# Patient Record
Sex: Male | Born: 1960 | Race: White | Hispanic: No | Marital: Single | Smoking: Current every day smoker
Health system: Southern US, Community
[De-identification: ages and names within clinical notes are randomized; demographics above are authoritative.]

## PROBLEM LIST (undated history)

## (undated) DIAGNOSIS — R519 Headache, unspecified: Secondary | ICD-10-CM

## (undated) DIAGNOSIS — R51 Headache: Secondary | ICD-10-CM

## (undated) DIAGNOSIS — I10 Essential (primary) hypertension: Secondary | ICD-10-CM

## (undated) DIAGNOSIS — E785 Hyperlipidemia, unspecified: Secondary | ICD-10-CM

## (undated) DIAGNOSIS — E890 Postprocedural hypothyroidism: Secondary | ICD-10-CM

## (undated) DIAGNOSIS — R609 Edema, unspecified: Secondary | ICD-10-CM

## (undated) DIAGNOSIS — I639 Cerebral infarction, unspecified: Secondary | ICD-10-CM

## (undated) DIAGNOSIS — G8929 Other chronic pain: Secondary | ICD-10-CM

## (undated) HISTORY — DX: Headache, unspecified: R51.9

## (undated) HISTORY — DX: Edema, unspecified: R60.9

## (undated) HISTORY — DX: Headache: R51

## (undated) HISTORY — DX: Other chronic pain: G89.29

## (undated) HISTORY — PX: COLONOSCOPY: SHX174

## (undated) HISTORY — DX: Essential (primary) hypertension: I10

## (undated) HISTORY — PX: POLYPECTOMY: SHX149

## (undated) HISTORY — PX: OTHER SURGICAL HISTORY: SHX169

## (undated) HISTORY — DX: Postprocedural hypothyroidism: E89.0

## (undated) HISTORY — PX: ANKLE SURGERY: SHX546

## (undated) HISTORY — DX: Cerebral infarction, unspecified: I63.9

## (undated) HISTORY — DX: Hyperlipidemia, unspecified: E78.5

---

## 2010-11-06 ENCOUNTER — Emergency Department (HOSPITAL_COMMUNITY)
Admission: EM | Admit: 2010-11-06 | Discharge: 2010-11-06 | Payer: Self-pay | Source: Home / Self Care | Admitting: Emergency Medicine

## 2011-03-23 ENCOUNTER — Other Ambulatory Visit: Payer: Self-pay | Admitting: Otolaryngology

## 2011-03-23 DIAGNOSIS — H918X9 Other specified hearing loss, unspecified ear: Secondary | ICD-10-CM

## 2011-03-23 DIAGNOSIS — H918X3 Other specified hearing loss, bilateral: Secondary | ICD-10-CM

## 2011-03-23 DIAGNOSIS — R42 Dizziness and giddiness: Secondary | ICD-10-CM

## 2011-03-26 ENCOUNTER — Ambulatory Visit
Admission: RE | Admit: 2011-03-26 | Discharge: 2011-03-26 | Disposition: A | Discharge status: Home / Self Care | Payer: 59 | Source: Ambulatory Visit | Attending: Otolaryngology | Admitting: Otolaryngology

## 2011-03-26 DIAGNOSIS — R42 Dizziness and giddiness: Secondary | ICD-10-CM

## 2011-03-26 DIAGNOSIS — H918X9 Other specified hearing loss, unspecified ear: Secondary | ICD-10-CM

## 2011-03-26 MED ORDER — GADOBENATE DIMEGLUMINE 529 MG/ML IV SOLN
19.0000 mL | Freq: Once | INTRAVENOUS | Status: AC | PRN
  Administered 2011-03-26: 19 mL via INTRAVENOUS

## 2011-04-30 LAB — BASIC METABOLIC PANEL
BUN: 11 mg/dL (ref 4–21)
Creatinine: 0.8 mg/dL (ref 0.6–1.3)
Sodium: 139 mmol/L (ref 137–147)

## 2011-04-30 LAB — TSH: TSH: 0 u[IU]/mL — AB (ref 0.41–5.90)

## 2011-04-30 LAB — LIPID PANEL: LDl/HDL Ratio: 1

## 2011-04-30 LAB — HEPATIC FUNCTION PANEL
AST: 39 U/L (ref 14–40)
Bilirubin, Total: 0.6 mg/dL

## 2011-05-06 ENCOUNTER — Other Ambulatory Visit (HOSPITAL_COMMUNITY): Payer: Self-pay | Admitting: Diagnostic Neuroimaging

## 2011-05-06 ENCOUNTER — Ambulatory Visit (HOSPITAL_COMMUNITY): Payer: 59 | Attending: Diagnostic Neuroimaging | Admitting: Radiology

## 2011-05-06 DIAGNOSIS — I079 Rheumatic tricuspid valve disease, unspecified: Secondary | ICD-10-CM | POA: Insufficient documentation

## 2011-05-06 DIAGNOSIS — I10 Essential (primary) hypertension: Secondary | ICD-10-CM | POA: Insufficient documentation

## 2011-05-06 DIAGNOSIS — H9190 Unspecified hearing loss, unspecified ear: Secondary | ICD-10-CM | POA: Insufficient documentation

## 2011-05-06 DIAGNOSIS — R110 Nausea: Secondary | ICD-10-CM | POA: Insufficient documentation

## 2011-05-06 DIAGNOSIS — F17200 Nicotine dependence, unspecified, uncomplicated: Secondary | ICD-10-CM | POA: Insufficient documentation

## 2011-05-06 DIAGNOSIS — I6789 Other cerebrovascular disease: Secondary | ICD-10-CM

## 2011-05-06 DIAGNOSIS — I639 Cerebral infarction, unspecified: Secondary | ICD-10-CM

## 2011-05-11 ENCOUNTER — Other Ambulatory Visit (HOSPITAL_COMMUNITY): Payer: 59 | Admitting: Radiology

## 2011-06-14 ENCOUNTER — Ambulatory Visit (INDEPENDENT_AMBULATORY_CARE_PROVIDER_SITE_OTHER): Payer: 59 | Admitting: Internal Medicine

## 2011-06-14 ENCOUNTER — Other Ambulatory Visit (INDEPENDENT_AMBULATORY_CARE_PROVIDER_SITE_OTHER): Payer: 59

## 2011-06-14 ENCOUNTER — Encounter: Payer: Self-pay | Admitting: Internal Medicine

## 2011-06-14 ENCOUNTER — Other Ambulatory Visit: Payer: Self-pay | Admitting: Internal Medicine

## 2011-06-14 VITALS — BP 150/82 | HR 105 | Temp 98.6°F | Ht 71.0 in | Wt 199.0 lb

## 2011-06-14 DIAGNOSIS — E785 Hyperlipidemia, unspecified: Secondary | ICD-10-CM

## 2011-06-14 DIAGNOSIS — Z0000 Encounter for general adult medical examination without abnormal findings: Secondary | ICD-10-CM

## 2011-06-14 DIAGNOSIS — F17210 Nicotine dependence, cigarettes, uncomplicated: Secondary | ICD-10-CM | POA: Insufficient documentation

## 2011-06-14 DIAGNOSIS — E0590 Thyrotoxicosis, unspecified without thyrotoxic crisis or storm: Secondary | ICD-10-CM

## 2011-06-14 DIAGNOSIS — F17200 Nicotine dependence, unspecified, uncomplicated: Secondary | ICD-10-CM

## 2011-06-14 DIAGNOSIS — I10 Essential (primary) hypertension: Secondary | ICD-10-CM | POA: Insufficient documentation

## 2011-06-14 LAB — BASIC METABOLIC PANEL
CO2: 27 mEq/L (ref 19–32)
Calcium: 9.5 mg/dL (ref 8.4–10.5)
Chloride: 102 mEq/L (ref 96–112)
Glucose, Bld: 127 mg/dL — ABNORMAL HIGH (ref 70–99)
Sodium: 138 mEq/L (ref 135–145)

## 2011-06-14 LAB — CBC WITH DIFFERENTIAL/PLATELET
Basophils Absolute: 0 10*3/uL (ref 0.0–0.1)
Eosinophils Absolute: 0.1 10*3/uL (ref 0.0–0.7)
Hemoglobin: 14.5 g/dL (ref 13.0–17.0)
Lymphocytes Relative: 32.1 % (ref 12.0–46.0)
Lymphs Abs: 2.1 10*3/uL (ref 0.7–4.0)
MCHC: 34 g/dL (ref 30.0–36.0)
MCV: 99.2 fl (ref 78.0–100.0)
Monocytes Absolute: 0.6 10*3/uL (ref 0.1–1.0)
Neutro Abs: 3.8 10*3/uL (ref 1.4–7.7)
RDW: 15.9 % — ABNORMAL HIGH (ref 11.5–14.6)

## 2011-06-14 LAB — HEPATIC FUNCTION PANEL
AST: 63 U/L — ABNORMAL HIGH (ref 0–37)
Alkaline Phosphatase: 94 U/L (ref 39–117)
Total Bilirubin: 0.8 mg/dL (ref 0.3–1.2)

## 2011-06-14 LAB — LIPID PANEL
Total CHOL/HDL Ratio: 4
Triglycerides: 239 mg/dL — ABNORMAL HIGH (ref 0.0–149.0)
VLDL: 47.8 mg/dL — ABNORMAL HIGH (ref 0.0–40.0)

## 2011-06-14 LAB — T4, FREE: Free T4: 0.75 ng/dL (ref 0.60–1.60)

## 2011-06-14 MED ORDER — METOPROLOL SUCCINATE ER 50 MG PO TB24: 50.0000 mg | ORAL_TABLET | Freq: Every day | ORAL | Status: DC

## 2011-06-14 NOTE — Progress Notes (Signed)
Subjective:    Patient ID: Jon Boyd, male    DOB: 03-Nov-1960, 50 y.o.   MRN: [id number]  HPI New pt to me and our practice, here to establish care  Also patient is here today for annual physical. Patient feels well and has no complaints.  Also reviewed chronic medical issues: CVA hx - manifest as L ear pain and hearing loss with headache - seen by [facility], ENT then neuro due to abnormal finding on MRI (CVA) - no embolic source on echo 05/26/11  hypertension - on beta-blocker for headache and tremor, started same 05/2011 - the patient reports compliance with medication(s) as prescribed. Denies adverse side effects.  dyslipidemia - never prescribed statin per pt despite CVA hx  hyperthyroid dz? - Incidental low TSH on 04/30/11 labs - denies weight loss but +tremors, sister with thyroid surgery but no FH auto immune diseases   Past Medical History  Diagnosis Date  . Hypertension   . Stroke   . Chronic headaches   . Hyperlipidemia    Family History  Problem Relation Age of Onset  . COPD Mother   . Hyperthyroidism Sister    History  Substance Use Topics  . Smoking status: Current Everyday Smoker  . Smokeless tobacco: Not on file  . Alcohol Use: Yes   Review of Systems Constitutional: Negative for fever.  Respiratory: Negative for cough and shortness of breath.   Cardiovascular: Negative for chest pain.  Gastrointestinal: Negative for abdominal pain.  Musculoskeletal: Negative for gait problem.  Skin: Negative for rash.  Neurological: Negative for dizziness.  No other specific complaints in a complete review of systems (except as listed in HPI above).     Objective:   Physical Exam BP 150/82  Pulse 105  Temp(Src) 98.6 F (37 C) (Oral)  Ht 5\' 11"  (1.803 m)  Wt 199 lb (90.266 kg)  BMI 27.75 kg/m2  SpO2 97%  Constitutional:  oriented to person, place, and time. appears well-developed and well-nourished. No distress. Gruff voice (smoker) Neck: Normal range of  motion. Neck supple. No JVD present. No thyromegaly present. nontender, no nodules Cardiovascular: Normal rate, regular rhythm and normal heart sounds.  No murmur heard. no BLE edema Pulmonary/Chest: Effort normal and breath sounds normal. No respiratory distress. no wheezes.  Abdominal: Soft. Bowel sounds are normal. Patient exhibits no distension. There is no tenderness.  Musculoskeletal: Normal range of motion. Patient exhibits no edema.  Neurological: he is alert and oriented to person, place, and time. No cranial nerve deficit. Coordination normal. speech and cognition normal GU: deferred at pt request Skin: Skin is warm and dry.  No erythema or ulceration.  Psychiatric: he has a normal mood and affect. behavior is normal. Judgment and thought content normal.   Lab Results  Component Value Date   WBC 6.4 04/30/2011   HGB 14.1 04/30/2011   HCT 42 04/30/2011   PLT 235 04/30/2011   CHOL 186 04/30/2011   TRIG 162* 04/30/2011   HDL 77* 04/30/2011   ALT 40 04/30/2011   AST 39 04/30/2011   NA 139 04/30/2011   K 3.8 04/30/2011   CREATININE 0.8 04/30/2011   BUN 11 04/30/2011   TSH 0.00* 04/30/2011   HGBA1C 6.1* 04/30/2011        Assessment & Plan:  CPX - v70.0 - Patient has been counseled on age-appropriate routine health concerns for screening and prevention. These are reviewed and up-to-date. Immunizations are up-to-date or declined. Labs ordered and will be reviewed. Requests hold  on colo until med issues stabilized  Also see problem list. Medications and labs reviewed today.

## 2011-06-14 NOTE — Assessment & Plan Note (Signed)
5 minutes today spent counseling patient on unhealthy effects of continued tobacco abuse and encouragement of cessation including medical options available to help the patient quit smoking. 

## 2011-06-14 NOTE — Assessment & Plan Note (Signed)
Started on beta-blocker 04/2011 - improving but not yet controlled Increase beta-blocker dose - erx for same Recheck 6 weeks, sooner if problems BP Readings from Last 3 Encounters:  06/14/11 150/82

## 2011-06-14 NOTE — Assessment & Plan Note (Signed)
Incidental dx on labs - send for records from neuro Check TFTs and increase beta-blocker dose Refer for US neck and to endo for other eval and tx as needed

## 2011-06-14 NOTE — Patient Instructions (Signed)
It was good to see you today. We have reviewed your prior records including labs and tests today Will send for other records from your neurologist as discussed Increase metoprolol to 50mg  daily for blood pressure, headache and tremor symptoms - Your prescription(s) have been submitted to your pharmacy. Please take as directed and contact our office if you believe you are having problem(s) with the medication(s). Other Medications reviewed, no changes at this time. we'll make referral to endocrine (thyroid specialist) and for thyroid ultrasound. Our office will contact you regarding appointment(s) once made. Please schedule followup in 6 weeks, call sooner if problems. Continue to work on reducing your smoking - continue to cut back until you have quit!

## 2011-06-14 NOTE — Assessment & Plan Note (Signed)
recheck with CPX labs today - never on statin but need to review noted and MRI from recent neuro eval - ROI signed and requested today

## 2011-06-16 ENCOUNTER — Ambulatory Visit
Admission: RE | Admit: 2011-06-16 | Discharge: 2011-06-16 | Disposition: A | Discharge status: Home / Self Care | Payer: 59 | Source: Ambulatory Visit | Attending: Internal Medicine | Admitting: Internal Medicine

## 2011-06-16 DIAGNOSIS — E0590 Thyrotoxicosis, unspecified without thyrotoxic crisis or storm: Secondary | ICD-10-CM

## 2011-07-07 ENCOUNTER — Encounter: Payer: Self-pay | Admitting: Endocrinology

## 2011-07-07 ENCOUNTER — Ambulatory Visit (INDEPENDENT_AMBULATORY_CARE_PROVIDER_SITE_OTHER): Payer: 59 | Admitting: Endocrinology

## 2011-07-07 VITALS — BP 146/88 | HR 118 | Temp 98.0°F | Ht 71.0 in | Wt 196.0 lb

## 2011-07-07 DIAGNOSIS — I639 Cerebral infarction, unspecified: Secondary | ICD-10-CM | POA: Insufficient documentation

## 2011-07-07 DIAGNOSIS — K7460 Unspecified cirrhosis of liver: Secondary | ICD-10-CM | POA: Insufficient documentation

## 2011-07-07 DIAGNOSIS — R160 Hepatomegaly, not elsewhere classified: Secondary | ICD-10-CM

## 2011-07-07 DIAGNOSIS — E785 Hyperlipidemia, unspecified: Secondary | ICD-10-CM | POA: Insufficient documentation

## 2011-07-07 MED ORDER — METOPROLOL SUCCINATE ER 100 MG PO TB24: 100.0000 mg | ORAL_TABLET | Freq: Every day | ORAL | Status: DC

## 2011-07-07 MED ORDER — METHIMAZOLE 10 MG PO TABS: ORAL_TABLET | ORAL | Status: DC

## 2011-07-07 NOTE — Progress Notes (Signed)
Subjective:    Patient ID: Jon Boyd, male    DOB: 07/26/1961, 50 y.o.   MRN: [id number]  HPI Pt states approx 6 mos of moderate tremor of the hands, and assoc headache.  He says abnl tft were first noted at neurologist office.  Past Medical History  Diagnosis Date  . Hypertension   . Stroke   . Chronic headaches   . Hyperlipidemia     Past Surgical History  Procedure Date  . Back surgery   . Ankle surgery     History   Social History  . Marital Status: Single    Spouse Name: N/A    Number of Children: N/A  . Years of Education: N/A   Occupational History  . Not on file.   Social History Main Topics  . Smoking status: Current Everyday Smoker -- 1.0 packs/day for 35 years    Types: Cigarettes  . Smokeless tobacco: Not on file  . Alcohol Use: Yes  . Drug Use: No  . Sexually Active: Not on file   Other Topics Concern  . Not on file   Social History Narrative  . No narrative on file  5-6 beers/day  Current Outpatient Prescriptions on File Prior to Visit  Medication Sig Dispense Refill  . aspirin 81 MG chewable tablet Chew 81 mg by mouth daily.        . metoprolol succinate (TOPROL-XL) 50 MG 24 hr tablet Take 1 tablet (50 mg total) by mouth daily. Take 1 by mouth daily  30 tablet  6   No Known Allergies  Family History  Problem Relation Age of Onset  . COPD Mother   . Hyperthyroidism Sister   sister has hyperthyroidism.   Mother has uncertain type of thyroid dz  BP 146/88  Pulse 118  Temp(Src) 98 F (36.7 C) (Oral)  Ht 5\' 11"  (1.803 m)  Wt 196 lb (88.905 kg)  BMI 27.34 kg/m2  SpO2 97%  Review of Systems denies seizure, hoarseness, palpitations, sob, polyuria, myalgias, excessive diaphoresis, numbness, and hypoglycemia.  He reports slight blurry vision, diarrhea, anxiety, easy bruising, rhinorrhea, wheezing, and decreased hearing in the left ear.   He has lost 40 lbs x 1 year.    Objective:   Physical Exam VS: see vs page GEN: no distress HEAD:  head: no deformity eyes: no periorbital swelling, no proptosis external nose and ears are normal mouth: no lesion seen NECK: supple, thyroid is not enlarged CHEST WALL: no deformity LUNGS: clear to auscultation BREASTS:  No gynecomastia CV: reg rate and rhythm, no murmur ABD: abdomen is soft, nontender.  no splenomegaly, but the liver edge i palpable 5 cm below right anterior ribs.  not distended.  no hernia MUSCULOSKELETAL: muscle bulk and strength are grossly normal.  no obvious joint swelling.  gait is normal and steady EXTEMITIES: no deformity.  no ulcer on the feet.  feet are of normal color and temp.  no edema PULSES: dorsalis pedis intact bilat.  no carotid bruit NEURO:  cn 2-12 grossly intact.   readily moves all 4's.  sensation is intact to touch on the feet SKIN:  Normal texture and temperature.  No rash or suspicious lesion is visible.   NODES:  None palpable at the neck PSYCH: alert, oriented x3.  Does not appear anxious nor depressed.  Lab Results  Component Value Date   TSH 0.07* 06/14/2011      Assessment & Plan:  Hyperthyroidism, prob due to grave's dz Hepatomegaly, prob due  to grave's dz, but there are severl other possible causes Chronic alcoholism.  This limits interpretation of pt's sxs Htn, despite toprol, needs increased rx, especially in view of tachycardia also.   Wheeezing, new, prob due to smoking.  This could be exac by increased toprol.

## 2011-07-07 NOTE — Patient Instructions (Addendum)
i have sent a prescription to your pharmacy, for the thyroid medication (called "methimazole").  if ever you have fever while taking this medication, stop it and call us, because of the risk of a rare side-effect It is critically important to reduce alcohol intake, or stop altogether Let's check an ultrasound of the liver.  you will receive a phone call, about a day and time for an appointment.  then please call [phone] to hear your test results.  You will be prompted to enter the 9-digit "MRN" number that appears at the top left of this page, followed by #.  Then you will hear the message. here is a sample of "dulera-100."  take 1 puff 2x a day.  rinse mouth after using. Increase metoprolol to 100 mg daily.  i have sent a prescription to your pharmacy. Please make a follow-up appointment in 2 weeks.

## 2011-07-12 ENCOUNTER — Ambulatory Visit
Admission: RE | Admit: 2011-07-12 | Discharge: 2011-07-12 | Disposition: A | Discharge status: Home / Self Care | Payer: 59 | Source: Ambulatory Visit | Attending: Endocrinology | Admitting: Endocrinology

## 2011-07-12 DIAGNOSIS — R160 Hepatomegaly, not elsewhere classified: Secondary | ICD-10-CM

## 2011-07-21 ENCOUNTER — Ambulatory Visit (INDEPENDENT_AMBULATORY_CARE_PROVIDER_SITE_OTHER): Payer: 59 | Admitting: Endocrinology

## 2011-07-21 ENCOUNTER — Encounter: Payer: Self-pay | Admitting: Endocrinology

## 2011-07-21 ENCOUNTER — Other Ambulatory Visit (INDEPENDENT_AMBULATORY_CARE_PROVIDER_SITE_OTHER): Payer: 59

## 2011-07-21 VITALS — BP 104/66 | HR 86 | Temp 98.1°F | Ht 71.0 in | Wt 196.2 lb

## 2011-07-21 DIAGNOSIS — E0590 Thyrotoxicosis, unspecified without thyrotoxic crisis or storm: Secondary | ICD-10-CM

## 2011-07-21 NOTE — Patient Instructions (Addendum)
blood tests are being requested for you today.  please call [phone] to hear your test results.  You will be prompted to enter the 9-digit "MRN" number that appears at the top left of this page, followed by #.  Then you will hear the message. pending the test results, please continue the same methimazole for now.   Please come back for a follow-up appointment for 1 month.  Please make an appointment. if ever you have fever while taking methimazole, stop it and call us, because of the risk of a rare side-effect. (update: i left message on phone-tree:  Same rx).

## 2011-07-21 NOTE — Progress Notes (Signed)
  Subjective:    Patient ID: Jon Boyd, male    DOB: 1961-03-14, 50 y.o.   MRN: [id number]  HPI The state of at least three ongoing medical problems is addressed today: Hepatomegaly: Pt has stopped drinking etoh altogether.  Palpitations are much less now.   Hyperthyriodism: He takes the increased lopressor as rx'ed.  He still has tremor.  Denies fever.   Htn: He says wheezing is resolved.   Past Medical History  Diagnosis Date  . Hypertension   . Chronic headaches   . Hyperlipidemia   . Stroke     Past Surgical History  Procedure Date  . Back surgery   . Ankle surgery     History   Social History  . Marital Status: Single    Spouse Name: N/A    Number of Children: N/A  . Years of Education: N/A   Occupational History  . Not on file.   Social History Main Topics  . Smoking status: Current Everyday Smoker -- 1.0 packs/day for 35 years    Types: Cigarettes  . Smokeless tobacco: Not on file  . Alcohol Use: Yes  . Drug Use: No  . Sexually Active: Not on file   Other Topics Concern  . Not on file   Social History Narrative  . No narrative on file    Current Outpatient Prescriptions on File Prior to Visit  Medication Sig Dispense Refill  . aspirin 81 MG chewable tablet Chew 81 mg by mouth daily.        . methimazole (TAPAZOLE) 10 MG tablet 2 tabs, 2x a day  120 tablet  1  . metoprolol (TOPROL-XL) 100 MG 24 hr tablet Take 1 tablet (100 mg total) by mouth daily.  30 tablet  11  . mometasone-formoterol (DULERA) 100-5 MCG/ACT AERO Inhale 1 puff into the lungs 2 (two) times daily.          No Known Allergies  Family History  Problem Relation Age of Onset  . COPD Mother   . Hyperthyroidism Sister     BP 104/66  Pulse 86  Temp(Src) 98.1 F (36.7 C) (Oral)  Ht 5\' 11"  (1.803 m)  Wt 196 lb 3.2 oz (88.996 kg)  BMI 27.36 kg/m2  SpO2 95%   Review of Systems Denies weight change and excessive diaphoresis.    Objective:   Physical Exam VITAL SIGNS:  See vs  page GENERAL: no distress Neck: i think i can feel the top of a goiter, but i am not sure.    Lab Results  Component Value Date   TSH 0.09* 07/21/2011      Assessment & Plan:  Hyperthyroidism, not better yet.  Wheezing, improved Htn, overcontrolled Hepatomegaly, prob due to etoh. New.

## 2011-07-30 ENCOUNTER — Encounter: Payer: Self-pay | Admitting: Internal Medicine

## 2011-07-30 ENCOUNTER — Ambulatory Visit (INDEPENDENT_AMBULATORY_CARE_PROVIDER_SITE_OTHER): Payer: 59 | Admitting: Internal Medicine

## 2011-07-30 VITALS — BP 142/80 | HR 66 | Temp 98.5°F | Ht 71.0 in | Wt 194.4 lb

## 2011-07-30 DIAGNOSIS — I10 Essential (primary) hypertension: Secondary | ICD-10-CM

## 2011-07-30 DIAGNOSIS — E785 Hyperlipidemia, unspecified: Secondary | ICD-10-CM

## 2011-07-30 DIAGNOSIS — G4700 Insomnia, unspecified: Secondary | ICD-10-CM

## 2011-07-30 DIAGNOSIS — E0590 Thyrotoxicosis, unspecified without thyrotoxic crisis or storm: Secondary | ICD-10-CM

## 2011-07-30 MED ORDER — ZOLPIDEM TARTRATE 10 MG PO TABS: 10.0000 mg | ORAL_TABLET | Freq: Every evening | ORAL | Status: DC | PRN

## 2011-07-30 NOTE — Assessment & Plan Note (Signed)
Started on beta-blocker 04/2011 - Increased dose beta-blocker dose 06/2011-  Recheck 3 months, sooner if problems BP Readings from Last 3 Encounters:  07/30/11 142/80  07/21/11 104/66  07/07/11 146/88

## 2011-07-30 NOTE — Progress Notes (Signed)
  Subjective:    Patient ID: Jon Boyd, male    DOB: Jul 02, 1961, 50 y.o.   MRN: [id number]  HPI  Here for follow up - reviewed chronic medical issues:  CVA hx - manifest as L ear pain and hearing loss with headache - seen by [facility], ENT then neuro due to abnormal finding on MRI (CVA) - no embolic source on echo 05/26/11  hypertension - on beta-blocker for headache and tremor, started same 05/2011 - the patient reports compliance with medication(s) as prescribed. Denies adverse side effects.  dyslipidemia - never prescribed statin per pt despite CVA hx  hyperthyroid dz - Incidental low TSH on 04/30/11 labs - denies weight loss but +tremors, sister with thyroid surgery  - started [facility] 05/2011 and working with endo on same  COPD - bronchospam hx - out of dulera and unable to appreciate improvement on tx or decline off med - still smoking   Also insomnia last 2 weeks since breakup with g-friend of last 7 years - ?something to help him sleep  Past Medical History  Diagnosis Date  . Hypertension   . Chronic headaches   . Hyperlipidemia   . Stroke    Review of Systems  Constitutional: Negative for fever.  Respiratory: Negative for cough and shortness of breath.   Cardiovascular: Negative for chest pain.      Objective:   Physical Exam  BP 142/80  Pulse 66  Temp(Src) 98.5 F (36.9 C) (Oral)  Ht 5\' 11"  (1.803 m)  Wt 194 lb 6.4 oz (88.179 kg)  BMI 27.11 kg/m2  SpO2 97% Wt Readings from Last 3 Encounters:  07/30/11 194 lb 6.4 oz (88.179 kg)  07/21/11 196 lb 3.2 oz (88.996 kg)  07/07/11 196 lb (88.905 kg)   Constitutional:  appears well-developed and well-nourished. No distress. Gruff voice (smoker) Neck: Normal range of motion. Neck supple. No JVD present. No thyromegaly present. nontender, no nodules Cardiovascular: Normal rate, regular rhythm and normal heart sounds.  No murmur heard. no BLE edema Pulmonary/Chest: Effort normal and breath sounds normal. No respiratory  distress. no wheezes.  Neurological: he is alert and oriented to person, place, and time. No cranial nerve deficit. Coordination normal. speech and cognition normal Psychiatric: he has a normal mood and affect. behavior is normal. Judgment and thought content normal.   Lab Results  Component Value Date   WBC 6.7 06/14/2011   HGB 14.5 06/14/2011   HCT 42.7 06/14/2011   PLT 217.0 06/14/2011   CHOL 284* 06/14/2011   TRIG 239.0* 06/14/2011   HDL 80.40 06/14/2011   LDLDIRECT 170.9 06/14/2011   ALT 55* 06/14/2011   AST 63* 06/14/2011   NA 138 06/14/2011   K 4.4 06/14/2011   CL 102 06/14/2011   CREATININE 0.7 06/14/2011   BUN 8 06/14/2011   CO2 27 06/14/2011   TSH 0.09* 07/21/2011   PSA 0.26 06/14/2011   HGBA1C 6.1* 04/30/2011       Assessment & Plan:   Also see problem list. Medications and labs reviewed today.  Insomnia - stress induced - limited supply ambein to use prn until situation improves

## 2011-07-30 NOTE — Patient Instructions (Signed)
It was good to see you today. We have reviewed your prior records including labs and tests today Use ambien as needed for sleep - Your prescription(s) have been submitted to your pharmacy. Please take as directed and contact our office if you believe you are having problem(s) with the medication(s). Other Medications reviewed,  Stop dulera and no other changes  Continue to work with Dr. Everardo All on thyroid problems and quit smoking (when the time is right) Please schedule followup in 3-4 months for blood pressure check, call sooner if problems.

## 2011-07-30 NOTE — Assessment & Plan Note (Signed)
Incidental dx on labs - on MMZ Working with endo on same Lab Results  Component Value Date   TSH 0.09* 07/21/2011

## 2011-07-30 NOTE — Assessment & Plan Note (Signed)
never on statin but noted CVA hx - no advised for same by neuro following event CPX labs 06/2011 reviewed

## 2011-08-25 ENCOUNTER — Ambulatory Visit: Payer: 59 | Admitting: Endocrinology

## 2011-08-27 ENCOUNTER — Other Ambulatory Visit (INDEPENDENT_AMBULATORY_CARE_PROVIDER_SITE_OTHER): Payer: 59

## 2011-08-27 ENCOUNTER — Encounter: Payer: Self-pay | Admitting: Endocrinology

## 2011-08-27 ENCOUNTER — Ambulatory Visit (INDEPENDENT_AMBULATORY_CARE_PROVIDER_SITE_OTHER): Payer: 59 | Admitting: Endocrinology

## 2011-08-27 VITALS — BP 144/90 | HR 74 | Temp 98.0°F | Ht 71.0 in | Wt 203.0 lb

## 2011-08-27 DIAGNOSIS — E0590 Thyrotoxicosis, unspecified without thyrotoxic crisis or storm: Secondary | ICD-10-CM

## 2011-08-27 LAB — TSH: TSH: 13.59 u[IU]/mL — ABNORMAL HIGH (ref 0.35–5.50)

## 2011-08-27 LAB — T4, FREE: Free T4: 0.21 ng/dL — ABNORMAL LOW (ref 0.60–1.60)

## 2011-08-27 NOTE — Patient Instructions (Addendum)
blood tests are being requested for you today.  please call [phone] to hear your test results.  You will be prompted to enter the 9-digit "MRN" number that appears at the top left of this page, followed by #.  Then you will hear the message. pending the test results, please continue the same methimazole for now.   Please come back for a follow-up appointment for 6 weeks.  Please make an appointment. if ever you have fever while taking methimazole, stop it and call us, because of the risk of a rare side-effect. (update: i left message on phone-tree:  Reduce tapazole to 10 mg bid).

## 2011-08-27 NOTE — Progress Notes (Signed)
  Subjective:    Patient ID: Jon Boyd, male    DOB: 01/20/1961, 50 y.o.   MRN: [id number]  HPI He has a few mos of moderate tremor of the hands, but no assoc diaphoresis.  However, sxs are much better now.  He says he feel slightly better overall, over the last month or so.  Interpretation of sxs has been limited by the recent breakup of a long-term relationship.  Past Medical History  Diagnosis Date  . Hypertension   . Chronic headaches   . Hyperlipidemia   . Stroke     Past Surgical History  Procedure Date  . Back surgery   . Ankle surgery     History   Social History  . Marital Status: Single    Spouse Name: N/A    Number of Children: N/A  . Years of Education: N/A   Occupational History  . Not on file.   Social History Main Topics  . Smoking status: Current Everyday Smoker -- 1.0 packs/day for 35 years    Types: Cigarettes  . Smokeless tobacco: Not on file  . Alcohol Use: Yes  . Drug Use: No  . Sexually Active: Not on file   Other Topics Concern  . Not on file   Social History Narrative  . No narrative on file    Current Outpatient Prescriptions on File Prior to Visit  Medication Sig Dispense Refill  . aspirin 81 MG chewable tablet Chew 81 mg by mouth daily.        . methimazole (TAPAZOLE) 10 MG tablet Take 10 mg by mouth 2 (two) times daily.        . metoprolol (TOPROL-XL) 100 MG 24 hr tablet Take 1 tablet (100 mg total) by mouth daily.  30 tablet  11  . zolpidem (AMBIEN) 10 MG tablet Take 1 tablet (10 mg total) by mouth at bedtime as needed for sleep.  30 tablet  1    No Known Allergies  Family History  Problem Relation Age of Onset  . COPD Mother   . Hyperthyroidism Sister     BP 144/90  Pulse 74  Temp(Src) 98 F (36.7 C) (Oral)  Ht 5\' 11"  (1.803 m)  Wt 203 lb (92.08 kg)  BMI 28.31 kg/m2  SpO2 99%   Review of Systems Denies fever.  He has insomnia    Objective:   Physical Exam VITAL SIGNS:  See vs page GENERAL: no distress Neck:  thyroid is 3x normal size, difuse   Lab Results  Component Value Date   TSH 13.59* 08/27/2011      Assessment & Plan:  Hyperthyroidism, slightly overcontrolled Htn, with ? Of situational component Insomnia: in view of today's tft, this is not thyroid-related.  May be related to breakup of long-term relationship.

## 2011-09-09 ENCOUNTER — Other Ambulatory Visit: Payer: Self-pay | Admitting: Endocrinology

## 2011-09-25 ENCOUNTER — Other Ambulatory Visit: Payer: Self-pay | Admitting: Endocrinology

## 2011-09-27 ENCOUNTER — Telehealth: Payer: Self-pay

## 2011-09-27 MED ORDER — METHIMAZOLE 10 MG PO TABS: 10.0000 mg | ORAL_TABLET | Freq: Two times a day (BID) | ORAL | Status: DC

## 2011-09-27 NOTE — Telephone Encounter (Signed)
Pt advised.

## 2011-09-27 NOTE — Telephone Encounter (Signed)
Pt called stating he has been taking methimazole 10 mg 2 tablets bid. Pt was advised that per last OV he should have reduced medication to 1 tablet bid. Pt says he was unaware of this change but has ran out of medication and will not be able to refill until 11/30 per [employer]. Pt is currently out of the state and will be out of meds Wednesday. Okay for pt to resume medication on Saturday when he returns to town?

## 2011-09-27 NOTE — Telephone Encounter (Signed)
[facility] Triage Call Report Triage Record Num: [id number] Operator: Micah Flesher Patient Name: Jon Boyd Call Date & Time: 09/25/2011 3:27:26PM Patient Phone: [phone] PCP: Romero Belling Patient Gender: Male PCP Fax : [phone] Patient DOB: 12/07/1960 Practice Name: [facility] Reason for Call: Caller: [name]; PCP: Romero Belling; CB#: [phone]; Call Reason: Medication Question; Sx Onset: 09/25/2011; Sx Notes: ; Afebrile; Wt: ; Guideline Used: ; Disp:; Appt Scheduled?: Calling because he feels like his Rx was called in wrong by the MD. Methimazole 10 mg 2 tabs BID is what he usually takes but it was called into the pharmacy for 1 tab BID. I notified the MD on call (Dr. Clifton Custard) but she was unable to see any notes where the MD changed the dosage and she didn't feel comfortable authorizing a different dosage. I intsructed him to call back Mon. and speek with his MD to verify the correct dose. Pt will be out of town till [place]. 09-29-11. Protocol(s) Used: Office Note Recommended Outcome per Protocol: Information Noted and Sent to Office Reason for Outcome: Caller information to office Care Advice: ~ 09/25/2011 4:10:38PM Page 1 of 1 [email or web address]

## 2011-09-27 NOTE — Telephone Encounter (Signed)
Left message for pt to callback office.  

## 2011-09-27 NOTE — Telephone Encounter (Signed)
rx was sent for 10 mg bid.  Ret as sched

## 2011-10-08 ENCOUNTER — Ambulatory Visit (INDEPENDENT_AMBULATORY_CARE_PROVIDER_SITE_OTHER)
Admission: RE | Admit: 2011-10-08 | Discharge: 2011-10-08 | Disposition: A | Discharge status: Home / Self Care | Payer: 59 | Source: Ambulatory Visit | Attending: Endocrinology | Admitting: Endocrinology

## 2011-10-08 ENCOUNTER — Other Ambulatory Visit (INDEPENDENT_AMBULATORY_CARE_PROVIDER_SITE_OTHER): Payer: 59

## 2011-10-08 ENCOUNTER — Ambulatory Visit (INDEPENDENT_AMBULATORY_CARE_PROVIDER_SITE_OTHER): Payer: 59 | Admitting: Endocrinology

## 2011-10-08 ENCOUNTER — Encounter: Payer: Self-pay | Admitting: Endocrinology

## 2011-10-08 VITALS — BP 140/86 | HR 64 | Temp 98.3°F | Ht 71.0 in | Wt 207.0 lb

## 2011-10-08 DIAGNOSIS — E0590 Thyrotoxicosis, unspecified without thyrotoxic crisis or storm: Secondary | ICD-10-CM

## 2011-10-08 DIAGNOSIS — R05 Cough: Secondary | ICD-10-CM

## 2011-10-08 DIAGNOSIS — R059 Cough, unspecified: Secondary | ICD-10-CM

## 2011-10-08 LAB — TSH: TSH: 38.67 u[IU]/mL — ABNORMAL HIGH (ref 0.35–5.50)

## 2011-10-08 LAB — T4, FREE: Free T4: 0.13 ng/dL — ABNORMAL LOW (ref 0.60–1.60)

## 2011-10-08 MED ORDER — METOPROLOL SUCCINATE ER 50 MG PO TB24: 50.0000 mg | ORAL_TABLET | Freq: Every day | ORAL | Status: DC

## 2011-10-08 MED ORDER — DOXYCYCLINE HYCLATE 100 MG PO TABS: 100.0000 mg | ORAL_TABLET | Freq: Two times a day (BID) | ORAL | Status: AC

## 2011-10-08 NOTE — Progress Notes (Signed)
  Subjective:    Patient ID: Jon Boyd, male    DOB: 1961/07/30, 50 y.o.   MRN: [id number]  HPI Pt returns for f/u of hyperthyroidism, due to grave's dz.  Tapazole was chosen as rx, due to tachycardia, and therefore the need for prompt improvement.   Pt states few weeks of slight swelling of the arms and face, and assoc hoarseness.   Past Medical History  Diagnosis Date  . Hypertension   . Chronic headaches   . Hyperlipidemia   . Stroke     Past Surgical History  Procedure Date  . Back surgery   . Ankle surgery     History   Social History  . Marital Status: Single    Spouse Name: N/A    Number of Children: N/A  . Years of Education: N/A   Occupational History  . Not on file.   Social History Main Topics  . Smoking status: Current Everyday Smoker -- 1.0 packs/day for 35 years    Types: Cigarettes  . Smokeless tobacco: Not on file  . Alcohol Use: Yes  . Drug Use: No  . Sexually Active: Not on file   Other Topics Concern  . Not on file   Social History Narrative  . No narrative on file    Current Outpatient Prescriptions on File Prior to Visit  Medication Sig Dispense Refill  . aspirin 81 MG chewable tablet Chew 81 mg by mouth daily.          No Known Allergies  Family History  Problem Relation Age of Onset  . COPD Mother   . Hyperthyroidism Sister     BP 140/86  Pulse 64  Temp(Src) 98.3 F (36.8 C) (Oral)  Ht 5\' 11"  (1.803 m)  Wt 207 lb (93.895 kg)  BMI 28.87 kg/m2  SpO2 97%    Review of Systems Denies fever, but he has a prod cough.  He has slight doe and wheezing.      Objective:   Physical Exam VITAL SIGNS:  See vs page GENERAL: no distress head: no deformity eyes: no periorbital swelling, no proptosis external nose and ears are normal mouth: no lesion seen Both eac's and tm's are normal Neck: thyroid is slightly and diffusely enlarged. LUNGS:  Clear to auscultation. Ext: i do not detect any edema   Lab Results  Component  Value Date   TSH 38.67* 10/08/2011      Assessment & Plan:  Hyperthyroidism, overcontrolled Doe, prob due to copd, new

## 2011-10-08 NOTE — Patient Instructions (Addendum)
blood tests are being requested for you today.  please call [phone] to hear your test results.  You will be prompted to enter the 9-digit "MRN" number that appears at the top left of this page, followed by #.  Then you will hear the message. pending the test results, please continue the same methimazole for now.  However, the test results may say we need to reduce it. Please come back for a follow-up appointment for 6 weeks.  Please make an appointment. if ever you have fever while taking methimazole, stop it and call us, because of the risk of a rare side-effect. Reduce metoprolol to 50 mg daily. i have sent a prescription to your pharmacy. i have also sent a prescription to your pharmacy, for an antibiotic. here is a sample of "advair-100."  take 1 puff 2x a day.  rinse mouth after using. (update: i left message on phone-tree:  Stop tapazole).

## 2011-10-29 ENCOUNTER — Ambulatory Visit (INDEPENDENT_AMBULATORY_CARE_PROVIDER_SITE_OTHER): Payer: 59 | Admitting: Internal Medicine

## 2011-10-29 ENCOUNTER — Ambulatory Visit: Payer: 59 | Admitting: Internal Medicine

## 2011-10-29 ENCOUNTER — Encounter: Payer: Self-pay | Admitting: Internal Medicine

## 2011-10-29 DIAGNOSIS — I10 Essential (primary) hypertension: Secondary | ICD-10-CM

## 2011-10-29 DIAGNOSIS — E785 Hyperlipidemia, unspecified: Secondary | ICD-10-CM

## 2011-10-29 DIAGNOSIS — E0590 Thyrotoxicosis, unspecified without thyrotoxic crisis or storm: Secondary | ICD-10-CM

## 2011-10-29 DIAGNOSIS — G4700 Insomnia, unspecified: Secondary | ICD-10-CM

## 2011-10-29 MED ORDER — AMITRIPTYLINE HCL 10 MG PO TABS: 10.0000 mg | ORAL_TABLET | Freq: Every day | ORAL | Status: DC

## 2011-10-29 MED ORDER — METOPROLOL SUCCINATE ER 100 MG PO TB24: 100.0000 mg | ORAL_TABLET | Freq: Every day | ORAL | Status: DC

## 2011-10-29 NOTE — Patient Instructions (Signed)
It was good to see you today. Use elavil as needed for sleep - Your prescription(s) have been submitted to your pharmacy. Please take as directed and contact our office if you believe you are having problem(s) with the medication(s). Other Medications reviewed,  No changes - continue the metoprolol 100mg  daily or as directed Continue to work with Dr. Everardo All on thyroid problems and quit smoking (when the time is right) Please schedule followup in 4-6 months for blood pressure check, call sooner if problems.

## 2011-10-29 NOTE — Assessment & Plan Note (Signed)
never on statin but noted CVA hx - not advised for same by neuro following event CPX labs 06/2011 reviewed

## 2011-10-29 NOTE — Progress Notes (Signed)
  Subjective:    Patient ID: Jon Boyd, male    DOB: 08/25/1961, 50 y.o.   MRN: [id number]  HPI  Here for follow up - reviewed chronic medical issues:  CVA hx - manifest as L ear pain and hearing loss with headache - seen by [facility], ENT then neuro due to abnormal finding on MRI (CVA) - no embolic source on echo 05/26/11  hypertension - on beta-blocker for headache and tremor, started same 05/2011 - the patient reports compliance with medication(s) as prescribed. Denies adverse side effects.  dyslipidemia - never prescribed statin per pt despite CVA hx  hyperthyroid dz - Incidental low TSH on 04/30/11 labs - denies weight loss but +tremors, sister with thyroid surgery  - started [facility] 05/2011, stopped 10/2011 due to overcorrected TSH- working with endo on same  COPD - bronchospam hx - previous prescription dulera but unable to appreciate improvement on tx or decline off med - stopped same September 2012 - still smoking    Past Medical History  Diagnosis Date  . Hypertension   . Chronic headaches   . Hyperlipidemia   . Stroke   . Grave's disease 04/2011 dx   Review of Systems  Constitutional: Negative for fever or weight changes.  Respiratory: Negative for cough and shortness of breath.   Cardiovascular: Negative for chest pain.      Objective:   Physical Exam  BP 132/80  Pulse 83  Temp(Src) 98.1 F (36.7 C) (Oral)  Wt 198 lb (89.812 kg)  SpO2 99% Wt Readings from Last 3 Encounters:  10/29/11 198 lb (89.812 kg)  10/08/11 207 lb (93.895 kg)  08/27/11 203 lb (92.08 kg)   Constitutional:  appears well-developed and well-nourished. No distress. Gruff voice (smoker) Neck: Normal range of motion. Neck supple. No JVD present. No thyromegaly present. nontender, no nodules Cardiovascular: Normal rate, regular rhythm and normal heart sounds.  No murmur heard. no BLE edema Pulmonary/Chest: Effort normal and breath sounds normal. No respiratory distress. no wheezes.    Neurological: he is alert and oriented to person, place, and time. No cranial nerve deficit. Coordination normal. speech and cognition normal Psychiatric: he has a normal mood and affect. behavior is normal. Judgment and thought content normal.   Lab Results  Component Value Date   WBC 6.7 06/14/2011   HGB 14.5 06/14/2011   HCT 42.7 06/14/2011   PLT 217.0 06/14/2011   CHOL 284* 06/14/2011   TRIG 239.0* 06/14/2011   HDL 80.40 06/14/2011   LDLDIRECT 170.9 06/14/2011   ALT 55* 06/14/2011   AST 63* 06/14/2011   NA 138 06/14/2011   K 4.4 06/14/2011   CL 102 06/14/2011   CREATININE 0.7 06/14/2011   BUN 8 06/14/2011   CO2 27 06/14/2011   TSH 38.67* 10/08/2011   PSA 0.26 06/14/2011   HGBA1C 6.1* 04/30/2011       Assessment & Plan:   Also see problem list. Medications and labs reviewed today.  Continued insomnia - onset fall 2012 - precipitated by relationship end. Ineffective relief with Ambien due to adverse side effects - trial low-dose Elavil - new erx today

## 2011-10-29 NOTE — Assessment & Plan Note (Signed)
Incidental dx on labs 04/2011 -  on MMZ until 10/2011 - now no tx ongoing but symptoms seem "baseline" Working with endo on same Lab Results  Component Value Date   TSH 38.67* 10/08/2011

## 2011-10-29 NOTE — Assessment & Plan Note (Signed)
Started on beta-blocker 04/2011 - Increased dose beta-blocker dose 06/2011-   BP Readings from Last 3 Encounters:  10/29/11 132/80  10/08/11 140/86  08/27/11 144/90

## 2011-11-19 ENCOUNTER — Ambulatory Visit (INDEPENDENT_AMBULATORY_CARE_PROVIDER_SITE_OTHER): Payer: 59 | Admitting: Endocrinology

## 2011-11-19 ENCOUNTER — Encounter: Payer: Self-pay | Admitting: Endocrinology

## 2011-11-19 ENCOUNTER — Other Ambulatory Visit (INDEPENDENT_AMBULATORY_CARE_PROVIDER_SITE_OTHER): Payer: 59

## 2011-11-19 VITALS — BP 110/78 | HR 127 | Temp 98.5°F | Ht 71.0 in | Wt 198.0 lb

## 2011-11-19 DIAGNOSIS — E0590 Thyrotoxicosis, unspecified without thyrotoxic crisis or storm: Secondary | ICD-10-CM

## 2011-11-19 LAB — T4, FREE: Free T4: 1.33 ng/dL (ref 0.60–1.60)

## 2011-11-19 LAB — TSH: TSH: 0.08 u[IU]/mL — ABNORMAL LOW (ref 0.35–5.50)

## 2011-11-19 NOTE — Progress Notes (Signed)
  Subjective:    Patient ID: Jon Boyd, male    DOB: 1961/05/25, 51 y.o.   MRN: [id number]  HPI Pt returns for f/u of moderate hyperthyroidism, present x approx 6 mos, with assoc tachycardia, in the context of activity.  due to grave's dz.  Tapazole was chosen as rx, due to tachycardia, and therefore the need for prompt improvement.  The dosage was reduced, then it as d/c'ed altogether, due to persistent hypothyroidism.  pt states he feels well in general.   Past Medical History  Diagnosis Date  . Hypertension   . Chronic headaches   . Hyperlipidemia   . Stroke   . Grave's disease 04/2011 dx    Past Surgical History  Procedure Date  . Back surgery   . Ankle surgery     History   Social History  . Marital Status: Single    Spouse Name: N/A    Number of Children: N/A  . Years of Education: N/A   Occupational History  . Not on file.   Social History Main Topics  . Smoking status: Current Everyday Smoker -- 1.0 packs/day for 35 years    Types: Cigarettes  . Smokeless tobacco: Not on file  . Alcohol Use: Yes  . Drug Use: No  . Sexually Active: Not on file   Other Topics Concern  . Not on file   Social History Narrative  . No narrative on file    Current Outpatient Prescriptions on File Prior to Visit  Medication Sig Dispense Refill  . amitriptyline (ELAVIL) 10 MG tablet Take 1 tablet (10 mg total) by mouth at bedtime.  30 tablet  3  . aspirin 81 MG chewable tablet Chew 81 mg by mouth daily.          No Known Allergies  Family History  Problem Relation Age of Onset  . COPD Mother   . Hyperthyroidism Sister     BP 110/78  Pulse 127  Temp(Src) 98.5 F (36.9 C) (Oral)  Ht 5\' 11"  (1.803 m)  Wt 198 lb (89.812 kg)  BMI 27.62 kg/m2  SpO2 99%    Review of Systems Denies fever.  He has palpitations    Objective:   Physical Exam VITAL SIGNS:  See vs page GENERAL: no distress NECK: There is no palpable thyroid enlargement.  No thyroid nodule is palpable.   No palpable lymphadenopathy at the anterior neck.   Lab Results  Component Value Date   TSH 0.08* 11/19/2011      Assessment & Plan:  Hyperthyroidism, recurrent off tapazole Tachycardia.  He has risk of AF

## 2011-11-19 NOTE — Patient Instructions (Addendum)
blood tests are being requested for you today.  please call [phone] to hear your test results.  You will be prompted to enter the 9-digit "MRN" number that appears at the top left of this page, followed by #.  Then you will hear the message. Please come back for a follow-up appointment in 6 weeks. (update: i left message on phone-tree:  Resume tapazole at 10 mg qd.   Increase toprol to 150/d).

## 2011-12-31 ENCOUNTER — Ambulatory Visit: Payer: 59 | Admitting: Endocrinology

## 2012-01-13 ENCOUNTER — Ambulatory Visit (INDEPENDENT_AMBULATORY_CARE_PROVIDER_SITE_OTHER): Payer: 59 | Admitting: Endocrinology

## 2012-01-13 ENCOUNTER — Other Ambulatory Visit (INDEPENDENT_AMBULATORY_CARE_PROVIDER_SITE_OTHER): Payer: 59

## 2012-01-13 ENCOUNTER — Encounter: Payer: Self-pay | Admitting: Endocrinology

## 2012-01-13 VITALS — BP 146/86 | HR 86 | Temp 97.5°F | Ht 71.0 in | Wt 194.8 lb

## 2012-01-13 DIAGNOSIS — E0590 Thyrotoxicosis, unspecified without thyrotoxic crisis or storm: Secondary | ICD-10-CM

## 2012-01-13 NOTE — Progress Notes (Signed)
  Subjective:    Patient ID: Jon Boyd, male    DOB: 10/31/1961, 51 y.o.   MRN: [id number]  HPI Pt returns for f/u of moderate hyperthyroidism, present x approx 6 mos, with assoc tachycardia, in the context of activity.  due to grave's dz.  Tapazole was chosen as rx, due to tachycardia, and therefore the need for prompt improvement.  The dosage was reduced, then it as d/c'ed altogether, due to persistent hypothyroidism.  However, it was resumed at last ov.  Since then, he has a few mos of moderate tremor of the hands, and assoc numbness of the legs.  He says he takes toprol only 1/2 of 100 mg daily. Past Medical History  Diagnosis Date  . Hypertension   . Chronic headaches   . Hyperlipidemia   . Stroke   . Grave's disease 04/2011 dx    Past Surgical History  Procedure Date  . Back surgery   . Ankle surgery     History   Social History  . Marital Status: Single    Spouse Name: N/A    Number of Children: N/A  . Years of Education: N/A   Occupational History  . Not on file.   Social History Main Topics  . Smoking status: Current Everyday Smoker -- 1.0 packs/day for 35 years    Types: Cigarettes  . Smokeless tobacco: Not on file  . Alcohol Use: Yes  . Drug Use: No  . Sexually Active: Not on file   Other Topics Concern  . Not on file   Social History Narrative  . No narrative on file    Current Outpatient Prescriptions on File Prior to Visit  Medication Sig Dispense Refill  . amitriptyline (ELAVIL) 10 MG tablet Take 1 tablet (10 mg total) by mouth at bedtime.  30 tablet  3  . aspirin 81 MG chewable tablet Chew 81 mg by mouth daily.        . methimazole (TAPAZOLE) 10 MG tablet Take 10 mg by mouth daily.      . metoprolol succinate (TOPROL-XL) 100 MG 24 hr tablet Take 100 mg by mouth daily.         No Known Allergies  Family History  Problem Relation Age of Onset  . COPD Mother   . Hyperthyroidism Sister     BP 146/86  Pulse 86  Temp(Src) 97.5 F (36.4 C)  (Oral)  Ht 5\' 11"  (1.803 m)  Wt 194 lb 12.8 oz (88.361 kg)  BMI 27.17 kg/m2  SpO2 96%  Review of Systems Denies fever and signif. weight change    Objective:   Physical Exam VITAL SIGNS:  See vs page GENERAL: no distress NECK: There is no palpable thyroid enlargement.  No thyroid nodule is palpable.  No palpable lymphadenopathy at the anterior neck. Neuro: moderate tremor of the hands Skin: slightly diaphoretic   Lab Results  Component Value Date   TSH 3.04 01/13/2012      Assessment & Plan:  Hyperthyroidism, well-controlled.  Pt wants to pursue i-131 rx HTN.  Although pt doesn't need toprol for hyperthyroidism, he needs it for HTN.   Tremor, not thyroid-related

## 2012-01-13 NOTE — Patient Instructions (Addendum)
blood tests are being requested for you today.  You will receive a letter with results. Increase metoprolol to 100 mg daily. if ever you have fever while taking methimazole, stop it and call us, because of the risk of a rare side-effect. Another option for the overactive thyroid is to take the radioactive iodine.  Here is how: We would first check a thyroid "scan" (a special, but easy and painless type of thyroid x ray).  It works like this: you go to the x-ray department of the hospital to swallow a pill, which contains a miniscule amount of radiation.  You will not notice any symptoms from this.  You will go back to the x-ray department the next day, to lie down in front of a camera.  The results of this will be sent to me.  please call [phone] to hear your test results.  You will be prompted to enter the 9-digit "MRN" number that appears at the top left of this page, followed by #.  Then you will hear the message. Based on the results, i hope to order for you a treatment pill of radioactive iodine.  Although it is a larger amount of radiation, you will again notice no symptoms from this.  The pill is gone from your body in a few days (during which you should stay away from other people), but takes several months to work.  Therefore, please return here approximately 6-8 weeks after the treatment.  This treatment has been available for many years, and the only known side-effect is an underactive thyroid.  It is possible that i would eventually prescribe for you a thyroid hormone pill, which is very inexpensive.  You don't have to worry about side-effects of this thyroid hormone pill, because it is the same molecule your thyroid makes.

## 2012-01-14 ENCOUNTER — Telehealth: Payer: Self-pay

## 2012-01-14 LAB — TSH: TSH: 3.04 u[IU]/mL (ref 0.35–5.50)

## 2012-01-14 NOTE — Telephone Encounter (Signed)
Pt informed of lab results and of MD's instructions to d/c Methimazole. Pt informed PCC's will call with appointment for thyroid scan.

## 2012-01-14 NOTE — Telephone Encounter (Signed)
Pt called stating that he will proceed with radiation iodine and would like to have it scheduled for the week April 15th. Pt is also requesting results of labs once available.

## 2012-01-14 NOTE — Telephone Encounter (Signed)
Blood tests were normal Stay-off methimazole i ordered test Based on the results, i'll order i-131 rx

## 2012-01-25 ENCOUNTER — Ambulatory Visit (HOSPITAL_COMMUNITY): Payer: 59

## 2012-01-26 ENCOUNTER — Other Ambulatory Visit (HOSPITAL_COMMUNITY): Payer: 59

## 2012-01-26 ENCOUNTER — Other Ambulatory Visit: Payer: Self-pay | Admitting: Endocrinology

## 2012-01-26 ENCOUNTER — Encounter (HOSPITAL_COMMUNITY)
Admission: RE | Admit: 2012-01-26 | Discharge: 2012-01-26 | Disposition: A | Discharge status: Home / Self Care | Payer: 59 | Source: Ambulatory Visit | Attending: Endocrinology | Admitting: Endocrinology

## 2012-01-26 DIAGNOSIS — E0590 Thyrotoxicosis, unspecified without thyrotoxic crisis or storm: Secondary | ICD-10-CM

## 2012-01-27 ENCOUNTER — Encounter (HOSPITAL_COMMUNITY): Payer: Self-pay

## 2012-01-27 ENCOUNTER — Telehealth: Payer: Self-pay | Admitting: Endocrinology

## 2012-01-27 ENCOUNTER — Other Ambulatory Visit: Payer: Self-pay | Admitting: Endocrinology

## 2012-01-27 DIAGNOSIS — E0590 Thyrotoxicosis, unspecified without thyrotoxic crisis or storm: Secondary | ICD-10-CM

## 2012-01-27 MED ORDER — SODIUM IODIDE I 131 CAPSULE: 8.9000 | Freq: Once | INTRAVENOUS | Status: AC | PRN

## 2012-01-27 MED ORDER — SODIUM PERTECHNETATE TC 99M INJECTION
11.0000 | Freq: Once | INTRAVENOUS | Status: AC | PRN
  Administered 2012-01-27: 11 via INTRAVENOUS

## 2012-01-27 NOTE — Telephone Encounter (Signed)
please call patient: i ordered i-131 rx Resume methimazole 3 days later Ret here 6 weeks later

## 2012-02-01 NOTE — Telephone Encounter (Signed)
Pt advised and has already been scheduled for therapy on 04/15. Pt understood MD instruction on restarting medication and was transferred to schedule follow up.

## 2012-02-08 ENCOUNTER — Ambulatory Visit (HOSPITAL_COMMUNITY): Payer: 59

## 2012-02-14 ENCOUNTER — Encounter (HOSPITAL_COMMUNITY)
Admission: RE | Admit: 2012-02-14 | Discharge: 2012-02-14 | Disposition: A | Discharge status: Home / Self Care | Payer: 59 | Source: Ambulatory Visit | Attending: Endocrinology | Admitting: Endocrinology

## 2012-02-14 DIAGNOSIS — E0590 Thyrotoxicosis, unspecified without thyrotoxic crisis or storm: Secondary | ICD-10-CM | POA: Insufficient documentation

## 2012-02-14 MED ORDER — SODIUM IODIDE I 131 CAPSULE
31.2000 | Freq: Once | INTRAVENOUS | Status: AC | PRN
  Administered 2012-02-14: 31.2 via ORAL

## 2012-02-22 ENCOUNTER — Telehealth: Payer: Self-pay | Admitting: Endocrinology

## 2012-02-22 NOTE — Telephone Encounter (Signed)
Pt informed of MD's advisement. Pt will callback to scheduled an early appointment.

## 2012-02-22 NOTE — Telephone Encounter (Signed)
Please continue. Ov this week if you want

## 2012-02-22 NOTE — Telephone Encounter (Signed)
Left message for pt to callback office.  

## 2012-02-22 NOTE — Telephone Encounter (Signed)
PT HAD RADIATION TREATMENT LAST WEEKEND.  HE STARTED TAKING HIS THYROID MEDICINE AGAIN SAT. (METHIMAZOLE).   HE IS HAVING HOT FLASHES. SHOULD HE KEEP TAKING THE MEDICINE OR STOP UNTIL HE SEES YOU ON MAY 28.

## 2012-03-21 ENCOUNTER — Telehealth: Payer: Self-pay

## 2012-03-21 NOTE — Telephone Encounter (Signed)
Pt called requesting a "medical card" stating that he had radiation treatment. Pt states that he set of the radiation alarms when going into [employer] building in [place]. Pt will call back with fax number. Please advise. Pt is requesting letter be faxed by tomorrow morning before he is due back to work.

## 2012-03-21 NOTE — Telephone Encounter (Signed)
Fax to [phone]

## 2012-03-21 NOTE — Telephone Encounter (Signed)
The treatment was more than 5 weeks ago.  It is long gone from your body.

## 2012-03-22 ENCOUNTER — Telehealth: Payer: Self-pay

## 2012-03-22 NOTE — Telephone Encounter (Signed)
i did letter 

## 2012-03-22 NOTE — Telephone Encounter (Signed)
Letter faxed to # pt provided. Left message on pt's VM informing pt letter faxed.

## 2012-03-22 NOTE — Telephone Encounter (Signed)
Letter faxed to fax # provided by pt.  Left message on pt's VM informing pt letter was faxed.

## 2012-03-22 NOTE — Telephone Encounter (Signed)
[facility] Triage Call Report Triage Record Num: [id number] Operator: Griselda Miner Patient Name: Jon Boyd Call Date & Time: 03/21/2012 6:23:07PM Patient Phone: [phone] PCP: Romero Belling Patient Gender: Male PCP Fax : [phone] Patient DOB: [birth year] [facility] Name: [facility] Reason for Call: Caller: [place]/Patient; PCP: Romero Belling; CB#: [phone]; Call regarding Patient Has Had Radiation Recently; Setting Off Alarms At [employer] in [place]; Needs medical documentation that the amt. of radiation being detected in his body when scanned at the [employer] is related to a medical issue. 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 Stated that in speaking to office today was told that provider said it was no way any detecteable radiation could be present with radiation completed 5 wks ago. Pt is quite upset that office has not or may not be able to send fax at time that he will need it. Protocol(s) Used: Office Note Recommended Outcome per Protocol: Information Noted and Sent to Office Reason for Outcome: Caller information to office Care Advice: ~ 03/21/2012 6:35:21PM Page 1 of 1 [email or web address]

## 2012-03-28 ENCOUNTER — Ambulatory Visit (INDEPENDENT_AMBULATORY_CARE_PROVIDER_SITE_OTHER): Payer: 59 | Admitting: Endocrinology

## 2012-03-28 ENCOUNTER — Other Ambulatory Visit (INDEPENDENT_AMBULATORY_CARE_PROVIDER_SITE_OTHER): Payer: 59

## 2012-03-28 ENCOUNTER — Encounter: Payer: Self-pay | Admitting: Endocrinology

## 2012-03-28 VITALS — BP 142/82 | HR 76 | Temp 98.3°F

## 2012-03-28 DIAGNOSIS — E0590 Thyrotoxicosis, unspecified without thyrotoxic crisis or storm: Secondary | ICD-10-CM

## 2012-03-28 LAB — T4, FREE: Free T4: 0.48 ng/dL — ABNORMAL LOW (ref 0.60–1.60)

## 2012-03-28 NOTE — Progress Notes (Signed)
  Subjective:    Patient ID: Jon Boyd, male    DOB: Mar 14, 1961, 51 y.o.   MRN: [id number]  HPI Pt is 6 weeks s/p i-131 rx for hyperthyroidism, due to grave's dz.  pt states he feels well in general, except for intermittent lightheadedness.  He is taking tapazole while the i-131 is working.   Past Medical History  Diagnosis Date  . Hypertension   . Chronic headaches   . Hyperlipidemia   . Stroke   . Grave's disease 04/2011 dx    Past Surgical History  Procedure Date  . Back surgery   . Ankle surgery     History   Social History  . Marital Status: Single    Spouse Name: N/A    Number of Children: N/A  . Years of Education: N/A   Occupational History  . Not on file.   Social History Main Topics  . Smoking status: Current Everyday Smoker -- 1.0 packs/day for 35 years    Types: Cigarettes  . Smokeless tobacco: Not on file  . Alcohol Use: Yes  . Drug Use: No  . Sexually Active: Not on file   Other Topics Concern  . Not on file   Social History Narrative  . No narrative on file    Current Outpatient Prescriptions on File Prior to Visit  Medication Sig Dispense Refill  . amitriptyline (ELAVIL) 10 MG tablet Take 1 tablet (10 mg total) by mouth at bedtime.  30 tablet  3  . aspirin 81 MG chewable tablet Chew 81 mg by mouth daily.        . metoprolol succinate (TOPROL-XL) 100 MG 24 hr tablet Take 100 mg by mouth daily.         No Known Allergies  Family History  Problem Relation Age of Onset  . COPD Mother   . Hyperthyroidism Sister     BP 142/82  Pulse 76  Temp(Src) 98.3 F (36.8 C) (Oral)  SpO2 98%    Review of Systems He has gained a few lbs.    Objective:   Physical Exam VITAL SIGNS:  See vs page GENERAL: no distress NECK: There is no palpable thyroid enlargement.  No thyroid nodule is palpable.  No palpable lymphadenopathy at the anterior neck.     Lab Results  Component Value Date   TSH 13.58* 03/28/2012      Assessment & Plan:    Hyperthyroidism, overcontrolled

## 2012-03-28 NOTE — Patient Instructions (Addendum)
blood tests are being requested for you today.  You will receive a letter with results. Please come back for a follow-up appointment for 1 month. if ever you have fever while taking methimazole, stop it and call us, because of the risk of a rare side-effect 

## 2012-03-29 ENCOUNTER — Telehealth: Payer: Self-pay | Admitting: *Deleted

## 2012-03-29 NOTE — Telephone Encounter (Signed)
Called pt to inform of lab results, pt informed (letter also mailed to pt). 

## 2012-03-30 ENCOUNTER — Ambulatory Visit: Payer: 59 | Admitting: Endocrinology

## 2012-04-28 ENCOUNTER — Ambulatory Visit (INDEPENDENT_AMBULATORY_CARE_PROVIDER_SITE_OTHER): Payer: 59 | Admitting: Internal Medicine

## 2012-04-28 ENCOUNTER — Encounter: Payer: Self-pay | Admitting: Endocrinology

## 2012-04-28 ENCOUNTER — Ambulatory Visit (INDEPENDENT_AMBULATORY_CARE_PROVIDER_SITE_OTHER): Payer: 59 | Admitting: Endocrinology

## 2012-04-28 ENCOUNTER — Encounter: Payer: Self-pay | Admitting: Internal Medicine

## 2012-04-28 ENCOUNTER — Telehealth: Payer: Self-pay | Admitting: *Deleted

## 2012-04-28 ENCOUNTER — Other Ambulatory Visit (INDEPENDENT_AMBULATORY_CARE_PROVIDER_SITE_OTHER): Payer: 59

## 2012-04-28 ENCOUNTER — Ambulatory Visit (HOSPITAL_COMMUNITY)
Admission: RE | Admit: 2012-04-28 | Discharge: 2012-04-28 | Disposition: A | Discharge status: Home / Self Care | Payer: 59 | Source: Ambulatory Visit | Attending: Endocrinology | Admitting: Endocrinology

## 2012-04-28 VITALS — BP 148/90 | HR 66 | Temp 98.1°F | Ht 71.0 in | Wt 204.8 lb

## 2012-04-28 VITALS — BP 122/90 | HR 67 | Temp 98.3°F

## 2012-04-28 DIAGNOSIS — E785 Hyperlipidemia, unspecified: Secondary | ICD-10-CM

## 2012-04-28 DIAGNOSIS — E0590 Thyrotoxicosis, unspecified without thyrotoxic crisis or storm: Secondary | ICD-10-CM

## 2012-04-28 DIAGNOSIS — I10 Essential (primary) hypertension: Secondary | ICD-10-CM

## 2012-04-28 DIAGNOSIS — M25569 Pain in unspecified knee: Secondary | ICD-10-CM

## 2012-04-28 DIAGNOSIS — F5104 Psychophysiologic insomnia: Secondary | ICD-10-CM

## 2012-04-28 DIAGNOSIS — M898X9 Other specified disorders of bone, unspecified site: Secondary | ICD-10-CM | POA: Insufficient documentation

## 2012-04-28 DIAGNOSIS — G4700 Insomnia, unspecified: Secondary | ICD-10-CM

## 2012-04-28 LAB — TSH: TSH: 60.17 u[IU]/mL — ABNORMAL HIGH (ref 0.35–5.50)

## 2012-04-28 LAB — T4, FREE: Free T4: 0.16 ng/dL — ABNORMAL LOW (ref 0.60–1.60)

## 2012-04-28 MED ORDER — LEVOTHYROXINE SODIUM 125 MCG PO TABS: 125.0000 ug | ORAL_TABLET | Freq: Every day | ORAL | Status: DC

## 2012-04-28 MED ORDER — AMITRIPTYLINE HCL 25 MG PO TABS: 25.0000 mg | ORAL_TABLET | Freq: Every day | ORAL | Status: DC

## 2012-04-28 MED ORDER — ATORVASTATIN CALCIUM 20 MG PO TABS: 20.0000 mg | ORAL_TABLET | Freq: Every day | ORAL | Status: DC

## 2012-04-28 MED ORDER — ALBUTEROL SULFATE HFA 108 (90 BASE) MCG/ACT IN AERS: 2.0000 | INHALATION_SPRAY | Freq: Four times a day (QID) | RESPIRATORY_TRACT | Status: DC | PRN

## 2012-04-28 NOTE — Progress Notes (Signed)
  Subjective:    Patient ID: Jon Boyd, male    DOB: 12-24-1960, 51 y.o.   MRN: [id number]  HPI  Here for follow up - reviewed chronic medical issues:  CVA hx - manifest as L ear pain and hearing loss with headache - seen by [facility], ENT then neuro due to abnormal finding on MRI (CVA) - no embolic source on echo 05/26/11  hypertension - on beta-blocker for headache and tremor, started same 05/2011 - the patient reports compliance with medication(s) as prescribed. Denies adverse side effects.  dyslipidemia - never prescribed statin per pt despite CVA hx  hyperthyroid dz, Grave's dz - Incidental low TSH on 04/30/11 labs - denies weight loss but +tremors, sister with thyroid surgery  - started [facility] 05/2011, stopped 10/2011 due to overcorrected TSH; then I-131 tx 01/2012- working with endo on same  COPD - bronchospam hx - previous prescription dulera but unable to appreciate improvement on tx or decline off med - stopped same September 2012 - still smoking but aware of need to consider same, ?rescue inhaler for spells if needed   Past Medical History  Diagnosis Date  . Hypertension   . Chronic headaches   . Hyperlipidemia   . Stroke   . Grave's disease 04/2011 dx    s/p I-131 tx 01/2012   Review of Systems  Constitutional: Negative for fever or unexpected weight changes.  Respiratory: Negative for cough and shortness of breath.   Cardiovascular: Negative for chest pain.      Objective:   Physical Exam  BP 148/90  Pulse 66  Temp 98.1 F (36.7 C) (Oral)  Ht 5\' 11"  (1.803 m)  Wt 204 lb 12.8 oz (92.897 kg)  BMI 28.56 kg/m2  SpO2 97% Wt Readings from Last 3 Encounters:  04/28/12 204 lb 12.8 oz (92.897 kg)  01/13/12 194 lb 12.8 oz (88.361 kg)  11/19/11 198 lb (89.812 kg)   Constitutional:  appears well-developed and well-nourished. No distress. Gruff voice (smoker) Neck: Normal range of motion. Neck supple. No JVD present. No thyromegaly present. nontender, no  nodules Cardiovascular: Normal rate, regular rhythm and normal heart sounds.  No murmur heard. no BLE edema Pulmonary/Chest: Effort normal and breath sounds normal. No respiratory distress. no wheezes.  Neurological: he is alert and oriented to person, place, and time. No cranial nerve deficit. Coordination normal. speech and cognition normal Psychiatric: he has a normal mood and affect. behavior is normal. Judgment and thought content normal.   Lab Results  Component Value Date   WBC 6.7 06/14/2011   HGB 14.5 06/14/2011   HCT 42.7 06/14/2011   PLT 217.0 06/14/2011   CHOL 284* 06/14/2011   TRIG 239.0* 06/14/2011   HDL 80.40 06/14/2011   LDLDIRECT 170.9 06/14/2011   ALT 55* 06/14/2011   AST 63* 06/14/2011   NA 138 06/14/2011   K 4.4 06/14/2011   CL 102 06/14/2011   CREATININE 0.7 06/14/2011   BUN 8 06/14/2011   CO2 27 06/14/2011   TSH 13.58* 03/28/2012   PSA 0.26 06/14/2011   HGBA1C 6.1* 04/30/2011       Assessment & Plan:   Also see problem list. Medications and labs reviewed today.  Continued insomnia - onset fall 2012 - precipitated by relationship end. Ineffective relief with Ambien due to adverse side effects - trial low-dose Elavil started 12/12, titrate up now - new erx today

## 2012-04-28 NOTE — Assessment & Plan Note (Signed)
Started on beta-blocker 04/2011 - Increased dose beta-blocker dose 06/2011-  Still borderline - pt attributes to knee pain today Will defer tx change at this time but encouraged to monitor an call if SBP>140  BP Readings from Last 3 Encounters:  04/28/12 148/90  04/28/12 122/90  03/28/12 142/82

## 2012-04-28 NOTE — Progress Notes (Signed)
  Subjective:    Patient ID: Jon Boyd, male    DOB: 29-Jun-1961, 51 y.o.   MRN: [id number]  HPI Pt is 2 1/2 months s/p i-131 rx for hyperthyroidism, due to grave's dz.  He is off tapazole now. Pt state a few weeks of slight swelling of the left knee, and assoc pain Past Medical History  Diagnosis Date  . Hypertension   . Chronic headaches   . Hyperlipidemia   . Stroke   . Grave's disease 04/2011 dx    s/p I-131 tx 01/2012    Past Surgical History  Procedure Date  . Back surgery   . Ankle surgery     History   Social History  . Marital Status: Single    Spouse Name: N/A    Number of Children: N/A  . Years of Education: N/A   Occupational History  . Not on file.   Social History Main Topics  . Smoking status: Current Everyday Smoker -- 1.0 packs/day for 35 years    Types: Cigarettes  . Smokeless tobacco: Not on file  . Alcohol Use: Yes  . Drug Use: No  . Sexually Active: Not on file   Other Topics Concern  . Not on file   Social History Narrative  . No narrative on file    Current Outpatient Prescriptions on File Prior to Visit  Medication Sig Dispense Refill  . aspirin 81 MG chewable tablet Chew 81 mg by mouth daily.        . metoprolol succinate (TOPROL-XL) 100 MG 24 hr tablet Take 100 mg by mouth daily.       Marland Kitchen albuterol (PROVENTIL HFA;VENTOLIN HFA) 108 (90 BASE) MCG/ACT inhaler Inhale 2 puffs into the lungs every 6 (six) hours as needed for wheezing.  1 Inhaler  0  . amitriptyline (ELAVIL) 25 MG tablet Take 1-2 tablets (25-50 mg total) by mouth at bedtime.  60 tablet  3  . atorvastatin (LIPITOR) 20 MG tablet Take 1 tablet (20 mg total) by mouth daily.  90 tablet  3  . levothyroxine (SYNTHROID, LEVOTHROID) 125 MCG tablet Take 1 tablet (125 mcg total) by mouth daily.  30 tablet  2    No Known Allergies  Family History  Problem Relation Age of Onset  . COPD Mother   . Hyperthyroidism Sister     BP 122/90  Pulse 67  Temp 98.3 F (36.8 C) (Oral)   SpO2 95%  Review of Systems He has fatigue and weight gain.    Objective:   Physical Exam VITAL SIGNS:  See vs page GENERAL: no distress Left knee: no swell/tender/warmth.  Full rom, but rom is slightly painful Gait: normal, but slightly painful  Lab Results  Component Value Date   TSH 60.17* 04/28/2012  (i reviewed x-ray result)    Assessment & Plan:  Post-i-131 hypothyroidism, new Right knee swelling, new HTN, with probable situational component.

## 2012-04-28 NOTE — Assessment & Plan Note (Signed)
never on statin but noted CVA hx - not advised for same by neuro following event CPX labs 06/2011 reviewed - will start atorva now - erx done Recheck in 3 mo

## 2012-04-28 NOTE — Patient Instructions (Signed)
It was good to see you today. Use elavil as needed for sleep - will increase dose now  Start generic lipitor for cholesterol and use rescue inhaler for shortness of breath as needed Your prescription(s) have been submitted to your pharmacy. Please take as directed and contact our office if you believe you are having problem(s) with the medication(s). Other Medications reviewed,  No other changes today Continue to work with Dr. Everardo All on thyroid problems and quit smoking (when the time is right) Please schedule followup in 3 months for cholesterol and blood pressure check, call sooner if problems.

## 2012-04-28 NOTE — Patient Instructions (Addendum)
blood tests and x-rays are being requested for you today.  You will receive a letter with results.  Based on the results, i expect to prescribe you a thyroid hormone pill.   Please come back for a follow-up appointment for 1 month.

## 2012-04-28 NOTE — Telephone Encounter (Signed)
Called pt to informed of lab and xray results, pt informed of results (letter also mailed to pt).

## 2012-05-01 ENCOUNTER — Other Ambulatory Visit: Payer: Self-pay | Admitting: *Deleted

## 2012-05-01 NOTE — Telephone Encounter (Signed)
Received fax pt needing PA for lipitor. Contacted insurance faxing over form... 05/01/12@4 :45pm/LMB

## 2012-05-02 NOTE — Telephone Encounter (Signed)
Received PA md has sign fax back to insurance for approval status... 05/02/12@8 :36am/LMB

## 2012-05-03 NOTE — Telephone Encounter (Signed)
Received PA back med has been approve. Notified pharmacy spoke with christy...05/03/12@10 :00am/LMB

## 2012-05-26 ENCOUNTER — Encounter: Payer: Self-pay | Admitting: Endocrinology

## 2012-05-26 ENCOUNTER — Ambulatory Visit (INDEPENDENT_AMBULATORY_CARE_PROVIDER_SITE_OTHER): Payer: 59 | Admitting: Endocrinology

## 2012-05-26 ENCOUNTER — Other Ambulatory Visit (INDEPENDENT_AMBULATORY_CARE_PROVIDER_SITE_OTHER): Payer: 59

## 2012-05-26 VITALS — BP 122/76 | HR 80 | Temp 97.1°F | Ht 71.0 in | Wt 204.0 lb

## 2012-05-26 DIAGNOSIS — E0590 Thyrotoxicosis, unspecified without thyrotoxic crisis or storm: Secondary | ICD-10-CM

## 2012-05-26 LAB — T4, FREE: Free T4: 0.98 ng/dL (ref 0.60–1.60)

## 2012-05-26 LAB — TSH: TSH: 6.27 u[IU]/mL — ABNORMAL HIGH (ref 0.35–5.50)

## 2012-05-26 MED ORDER — METOPROLOL SUCCINATE ER 50 MG PO TB24: 50.0000 mg | ORAL_TABLET | Freq: Every day | ORAL | Status: DC

## 2012-05-26 NOTE — Progress Notes (Signed)
  Subjective:    Patient ID: Jon Boyd, male    DOB: 07/12/1961, 51 y.o.   MRN: [id number]  HPI Pt is 3 1/2 months s/p i-131 rx for hyperthyroidism, due to grave's dz.  He has been on synthroid x 1 month.  pt states he feels much better in general, except for fatigue.  Past Medical History  Diagnosis Date  . Hypertension   . Chronic headaches   . Hyperlipidemia   . Stroke   . Grave's disease 04/2011 dx    s/p I-131 tx 01/2012    Past Surgical History  Procedure Date  . Back surgery   . Ankle surgery     History   Social History  . Marital Status: Single    Spouse Name: N/A    Number of Children: N/A  . Years of Education: N/A   Occupational History  . Not on file.   Social History Main Topics  . Smoking status: Current Everyday Smoker -- 1.0 packs/day for 35 years    Types: Cigarettes  . Smokeless tobacco: Not on file  . Alcohol Use: Yes  . Drug Use: No  . Sexually Active: Not on file   Other Topics Concern  . Not on file   Social History Narrative  . No narrative on file    Current Outpatient Prescriptions on File Prior to Visit  Medication Sig Dispense Refill  . albuterol (PROVENTIL HFA;VENTOLIN HFA) 108 (90 BASE) MCG/ACT inhaler Inhale 2 puffs into the lungs every 6 (six) hours as needed for wheezing.  1 Inhaler  0  . amitriptyline (ELAVIL) 25 MG tablet Take 1-2 tablets (25-50 mg total) by mouth at bedtime.  60 tablet  3  . aspirin 81 MG chewable tablet Chew 81 mg by mouth daily.        Marland Kitchen atorvastatin (LIPITOR) 20 MG tablet Take 1 tablet (20 mg total) by mouth daily.  90 tablet  3  . levothyroxine (SYNTHROID, LEVOTHROID) 125 MCG tablet Take 1 tablet (125 mcg total) by mouth daily.  30 tablet  2    No Known Allergies  Family History  Problem Relation Age of Onset  . COPD Mother   . Hyperthyroidism Sister     BP 122/76  Pulse 80  Temp 97.1 F (36.2 C) (Oral)  Ht 5\' 11"  (1.803 m)  Wt 204 lb (92.534 kg)  BMI 28.45 kg/m2  SpO2 96%  Review of  Systems Denies weight change    Objective:   Physical Exam VITAL SIGNS:  See vs page GENERAL: no distress NECK: There is no palpable thyroid enlargement.  No thyroid nodule is palpable.  No palpable lymphadenopathy at the anterior neck.     Lab Results  Component Value Date   TSH 6.27* 05/26/2012      Assessment & Plan:  Post-i-131 hypothyroidism, improved

## 2012-05-26 NOTE — Patient Instructions (Addendum)
blood tests are being requested for you today.  You will receive a letter with results. Please come back for a follow-up appointment for 1 month.   Reduce the metoprolol to 50 mg daily.  i have sent a prescription to your pharmacy.

## 2012-06-30 ENCOUNTER — Encounter: Payer: Self-pay | Admitting: Endocrinology

## 2012-06-30 ENCOUNTER — Other Ambulatory Visit (INDEPENDENT_AMBULATORY_CARE_PROVIDER_SITE_OTHER): Payer: 59

## 2012-06-30 ENCOUNTER — Ambulatory Visit (INDEPENDENT_AMBULATORY_CARE_PROVIDER_SITE_OTHER): Payer: 59 | Admitting: Endocrinology

## 2012-06-30 VITALS — BP 122/88 | HR 85 | Temp 97.7°F | Ht 68.0 in | Wt 206.0 lb

## 2012-06-30 DIAGNOSIS — E890 Postprocedural hypothyroidism: Secondary | ICD-10-CM

## 2012-06-30 DIAGNOSIS — E039 Hypothyroidism, unspecified: Secondary | ICD-10-CM | POA: Insufficient documentation

## 2012-06-30 LAB — TSH: TSH: 17.65 u[IU]/mL — ABNORMAL HIGH (ref 0.35–5.50)

## 2012-06-30 MED ORDER — METOPROLOL SUCCINATE ER 25 MG PO TB24: 25.0000 mg | ORAL_TABLET | Freq: Every day | ORAL | Status: DC

## 2012-06-30 MED ORDER — LEVOTHYROXINE SODIUM 150 MCG PO TABS: 150.0000 ug | ORAL_TABLET | Freq: Every day | ORAL | Status: DC

## 2012-06-30 NOTE — Patient Instructions (Addendum)
blood tests are being requested for you today.  You will receive a letter with results. Please come back for a follow-up appointment in 6 weeks.   Reduce the metoprolol to 25 mg daily.  i have sent a prescription to your pharmacy.        Smoking Cessation This document explains the best ways for you to quit smoking and new treatments to help. It lists new medicines that can double or triple your chances of quitting and quitting for good. It also considers ways to avoid relapses and concerns you may have about quitting, including weight gain. NICOTINE: A POWERFUL ADDICTION If you have tried to quit smoking, you know how hard it can be. It is hard because nicotine is a very addictive drug. For some people, it can be as addictive as heroin or cocaine. Usually, people make 2 or 3 tries, or more, before finally being able to quit. Each time you try to quit, you can learn about what helps and what hurts. Quitting takes hard work and a lot of effort, but you can quit smoking. QUITTING SMOKING IS ONE OF THE MOST IMPORTANT THINGS YOU WILL EVER DO.  You will live longer, feel better, and live better.   The impact on your body of quitting smoking is felt almost immediately:   Within 20 minutes, blood pressure decreases. Pulse returns to its normal level.   After 8 hours, carbon monoxide levels in the blood return to normal. Oxygen level increases.   After 24 hours, chance of heart attack starts to decrease. Breath, hair, and body stop smelling like smoke.   After 48 hours, damaged nerve endings begin to recover. Sense of taste and smell improve.   After 72 hours, the body is virtually free of nicotine. Bronchial tubes relax and breathing becomes easier.   After 2 to 12 weeks, lungs can hold more air. Exercise becomes easier and circulation improves.   Quitting will reduce your risk of having a heart attack, stroke, cancer, or lung disease:   After 1 year, the risk of coronary heart disease is  cut in half.   After 5 years, the risk of stroke falls to the same as a nonsmoker.   After 10 years, the risk of lung cancer is cut in half and the risk of other cancers decreases significantly.   After 15 years, the risk of coronary heart disease drops, usually to the level of a nonsmoker.   If you are pregnant, quitting smoking will improve your chances of having a healthy baby.   The people you live with, especially your children, will be healthier.   You will have extra money to spend on things other than cigarettes.  FIVE KEYS TO QUITTING Studies have shown that these 5 steps will help you quit smoking and quit for good. You have the best chances of quitting if you use them together: 1. Get ready.  2. Get support and encouragement.  3. Learn new skills and behaviors.  4. Get medicine to reduce your nicotine addiction and use it correctly.  5. Be prepared for relapse or difficult situations. Be determined to continue trying to quit, even if you do not succeed at first.  1. GET READY  Set a quit date.   Change your environment.   Get rid of ALL cigarettes, ashtrays, matches, and lighters in your home, car, and place of work.   Do not let people smoke in your home.   Review your past attempts to quit.  Think about what worked and what did not.   Once you quit, do not smoke. NOT EVEN A PUFF!  2. GET SUPPORT AND ENCOURAGEMENT Studies have shown that you have a better chance of being successful if you have help. You can get support in many ways.  Tell your family, friends, and coworkers that you are going to quit and need their support. Ask them not to smoke around you.   Talk to your caregivers (doctor, dentist, nurse, pharmacist, psychologist, and/or smoking counselor).   Get individual, group, or telephone counseling and support. The more counseling you have, the better your chances are of quitting. Programs are available at [employer] and health centers. Call your local  health department for information about programs in your area.   Spiritual beliefs and practices may help some smokers quit.   Quit meters are Photographer that keep track of quit statistics, such as amount of "quit-time," cigarettes not smoked, and money saved.   Many smokers find one or more of the many self-help books available useful in helping them quit and stay off tobacco.  3. LEARN NEW SKILLS AND BEHAVIORS  Try to distract yourself from urges to smoke. Talk to someone, go for a walk, or occupy your time with a task.   When you first try to quit, change your routine. Take a different route to work. Drink tea instead of coffee. Eat breakfast in a different place.   Do something to reduce your stress. Take a hot bath, exercise, or read a book.   Plan something enjoyable to do every day. Reward yourself for not smoking.   Explore interactive web-based programs that specialize in helping you quit.  4. GET MEDICINE AND USE IT CORRECTLY Medicines can help you stop smoking and decrease the urge to smoke. Combining medicine with the above behavioral methods and support can quadruple your chances of successfully quitting smoking. The [place] Food and Drug Administration (FDA) has approved 7 medicines to help you quit smoking. These medicines fall into 3 categories.  Nicotine replacement therapy (delivers nicotine to your body without the negative effects and risks of smoking):   Nicotine gum: Available over-the-counter.   Nicotine lozenges: Available over-the-counter.   Nicotine inhaler: Available by prescription.   Nicotine nasal spray: Available by prescription.   Nicotine skin patches (transdermal): Available by prescription and over-the-counter.   Antidepressant medicine (helps people abstain from smoking, but how this works is unknown):   Bupropion sustained-release (SR) tablets: Available by prescription.   Nicotinic receptor partial agonist  (simulates the effect of nicotine in your brain):   Varenicline tartrate tablets: Available by prescription.   Ask your caregiver for advice about which medicines to use and how to use them. Carefully read the information on the package.   Everyone who is trying to quit may benefit from using a medicine. If you are pregnant or trying to become pregnant, nursing an infant, you are under age 20, or you smoke fewer than 10 cigarettes per day, talk to your caregiver before taking any nicotine replacement medicines.   You should stop using a nicotine replacement product and call your caregiver if you experience nausea, dizziness, weakness, vomiting, fast or irregular heartbeat, mouth problems with the lozenge or gum, or redness or swelling of the skin around the patch that does not go away.   Do not use any other product containing nicotine while using a nicotine replacement product.   Talk to your caregiver before using  these products if you have diabetes, heart disease, asthma, stomach ulcers, you had a recent heart attack, you have high blood pressure that is not controlled with medicine, a history of irregular heartbeat, or you have been prescribed medicine to help you quit smoking.  5. BE PREPARED FOR RELAPSE OR DIFFICULT SITUATIONS  Most relapses occur within the first 3 months after quitting. Do not be discouraged if you start smoking again. Remember, most people try several times before they finally quit.   You may have symptoms of withdrawal because your body is used to nicotine. You may crave cigarettes, be irritable, feel very hungry, cough often, get headaches, or have difficulty concentrating.   The withdrawal symptoms are only temporary. They are strongest when you first quit, but they will go away within 10 to 14 days.  Here are some difficult situations to watch for:  Alcohol. Avoid drinking alcohol. Drinking lowers your chances of successfully quitting.   Caffeine. Try to reduce  the amount of caffeine you consume. It also lowers your chances of successfully quitting.   Other smokers. Being around smoking can make you want to smoke. Avoid smokers.   Weight gain. Many smokers will gain weight when they quit, usually less than 10 pounds. Eat a healthy diet and stay active. Do not let weight gain distract you from your main goal, quitting smoking. Some medicines that help you quit smoking may also help delay weight gain. You can always lose the weight gained after you quit.   Bad mood or depression. There are a lot of ways to improve your mood other than smoking.  If you are having problems with any of these situations, talk to your caregiver. SPECIAL SITUATIONS AND CONDITIONS Studies suggest that everyone can quit smoking. Your situation or condition can give you a special reason to quit.  Pregnant women/new mothers: By quitting, you protect your baby's health and your own.   Hospitalized patients: By quitting, you reduce health problems and help healing.   Heart attack patients: By quitting, you reduce your risk of a second heart attack.   Lung, head, and neck cancer patients: By quitting, you reduce your chance of a second cancer.   Parents of children and adolescents: By quitting, you protect your children from illnesses caused by secondhand smoke.  QUESTIONS TO THINK ABOUT Think about the following questions before you try to stop smoking. You may want to talk about your answers with your caregiver.  Why do you want to quit?   If you tried to quit in the past, what helped and what did not?   What will be the most difficult situations for you after you quit? How will you plan to handle them?   Who can help you through the tough times? Your family? Friends? Caregiver?   What pleasures do you get from smoking? What ways can you still get pleasure if you quit?  Here are some questions to ask your caregiver:  How can you help me to be successful at quitting?     What medicine do you think would be best for me and how should I take it?   What should I do if I need more help?   What is smoking withdrawal like? How can I get information on withdrawal?  Quitting takes hard work and a lot of effort, but you can quit smoking. FOR MORE INFORMATION   Smokefree.gov (http://www.davis-sullivan.com/) provides free, accurate, evidence-based information and professional assistance to help support the immediate and long-term  needs of people trying to quit smoking. Document Released: 10/12/2001 Document Revised: 10/07/2011 Document Reviewed: 08/04/2009 [facility] Patient Information 2012 [place], [place].

## 2012-06-30 NOTE — Progress Notes (Signed)
  Subjective:    Patient ID: Jon Boyd, male    DOB: 01-15-1961, 51 y.o.   MRN: [id number]  HPI The state of at least three ongoing medical problems is addressed today: Pt is 4 1/2 months s/p i-131 rx for hyperthyroidism, due to grave's dz.  He has been on synthroid x 2 months.  pt states he feels much better in general.  He does not notice any swelling at the anterior neck. Smoking: he has slight wheezing in the chest.   HTN: Denies dizziness Past Medical History  Diagnosis Date  . Hypertension   . Chronic headaches   . Hyperlipidemia   . Stroke   . Grave's disease 04/2011 dx    s/p I-131 tx 01/2012    Past Surgical History  Procedure Date  . Back surgery   . Ankle surgery     History   Social History  . Marital Status: Single    Spouse Name: N/A    Number of Children: N/A  . Years of Education: N/A   Occupational History  . Not on file.   Social History Main Topics  . Smoking status: Current Everyday Smoker -- 1.0 packs/day for 35 years    Types: Cigarettes  . Smokeless tobacco: Not on file  . Alcohol Use: Yes  . Drug Use: No  . Sexually Active: Not on file   Other Topics Concern  . Not on file   Social History Narrative  . No narrative on file    Current Outpatient Prescriptions on File Prior to Visit  Medication Sig Dispense Refill  . albuterol (PROVENTIL HFA;VENTOLIN HFA) 108 (90 BASE) MCG/ACT inhaler Inhale 2 puffs into the lungs every 6 (six) hours as needed for wheezing.  1 Inhaler  0  . amitriptyline (ELAVIL) 25 MG tablet Take 1-2 tablets (25-50 mg total) by mouth at bedtime.  60 tablet  3  . aspirin 81 MG chewable tablet Chew 81 mg by mouth daily.        Marland Kitchen atorvastatin (LIPITOR) 20 MG tablet Take 1 tablet (20 mg total) by mouth daily.  90 tablet  3    No Known Allergies  Family History  Problem Relation Age of Onset  . COPD Mother   . Hyperthyroidism Sister     BP 122/88  Pulse 85  Temp 97.7 F (36.5 C) (Oral)  Ht 5\' 8"  (1.727 m)  Wt  206 lb (93.441 kg)  BMI 31.32 kg/m2  SpO2 97%  Review of Systems He has gained a few lbs.  Denies tremor.    Objective:   Physical Exam VITAL SIGNS:  See vs page GENERAL: no distress NECK: There is no palpable thyroid enlargement.  No thyroid nodule is palpable.  No palpable lymphadenopathy at the anterior neck. LUNGS:  Clear to auscultation.     Lab Results  Component Value Date   TSH 17.65* 06/30/2012       Assessment & Plan:  Post-i-131 hypothyroidism, needs increased rx Smoker, with some wheezing HTN: with rx of his hyperthyroidism, he no longer needs so much toprol

## 2012-07-28 ENCOUNTER — Ambulatory Visit (INDEPENDENT_AMBULATORY_CARE_PROVIDER_SITE_OTHER): Payer: 59 | Admitting: Internal Medicine

## 2012-07-28 ENCOUNTER — Other Ambulatory Visit (INDEPENDENT_AMBULATORY_CARE_PROVIDER_SITE_OTHER): Payer: 59

## 2012-07-28 ENCOUNTER — Encounter: Payer: Self-pay | Admitting: Internal Medicine

## 2012-07-28 ENCOUNTER — Ambulatory Visit: Payer: 59 | Admitting: Internal Medicine

## 2012-07-28 VITALS — BP 140/90 | HR 89 | Temp 98.1°F | Resp 16 | Wt 202.5 lb

## 2012-07-28 DIAGNOSIS — Z79899 Other long term (current) drug therapy: Secondary | ICD-10-CM

## 2012-07-28 DIAGNOSIS — E785 Hyperlipidemia, unspecified: Secondary | ICD-10-CM

## 2012-07-28 DIAGNOSIS — I10 Essential (primary) hypertension: Secondary | ICD-10-CM

## 2012-07-28 DIAGNOSIS — R2232 Localized swelling, mass and lump, left upper limb: Secondary | ICD-10-CM

## 2012-07-28 DIAGNOSIS — R229 Localized swelling, mass and lump, unspecified: Secondary | ICD-10-CM

## 2012-07-28 LAB — HEPATIC FUNCTION PANEL
ALT: 42 U/L (ref 0–53)
AST: 64 U/L — ABNORMAL HIGH (ref 0–37)
Bilirubin, Direct: 0.3 mg/dL (ref 0.0–0.3)
Total Bilirubin: 0.8 mg/dL (ref 0.3–1.2)

## 2012-07-28 LAB — LIPID PANEL
HDL: 54.7 mg/dL (ref >39.00)
Total CHOL/HDL Ratio: 3

## 2012-07-28 MED ORDER — LOSARTAN POTASSIUM 50 MG PO TABS: 50.0000 mg | ORAL_TABLET | Freq: Every day | ORAL | Status: DC

## 2012-07-28 NOTE — Progress Notes (Signed)
  Subjective:    Patient ID: Jon Boyd, male    DOB: 1961/10/06, 51 y.o.   MRN: [id number]  HPI  Here for follow up - reviewed chronic medical issues:  CVA hx - manifest as L ear pain and hearing loss with headache - seen by [facility], ENT then neuro due to abnormal finding on MRI (CVA) - no embolic source on echo 05/26/11  hypertension - on beta-blocker for headache and tremor, started same 05/2011 - the patient reports compliance with medication(s) as prescribed. Denies adverse side effects.  dyslipidemia - started atorvastatin 04/2012 due to CVA hx - the patient reports compliance with medication(s) as prescribed. Denies adverse side effects.  hyperthyroid dz, Grave's dz - Incidental low TSH on 04/30/11 labs - denies weight loss but +tremors, sister with thyroid surgery  - started [facility] 05/2011, stopped 10/2011 due to overcorrected TSH; then I-131 tx 01/2012- working with endo on same  COPD - bronchospam hx - previous prescription dulera but unable to appreciate improvement on tx or decline off med - stopped same September 2012 - still smoking but aware of need to consider same,  Uses rescue inhaler if needed 2-3x/week   Past Medical History  Diagnosis Date  . Hypertension   . Chronic headaches   . Hyperlipidemia   . Stroke   . Grave's disease 04/2011 dx    s/p I-131 tx 01/2012   Review of Systems  Constitutional: Negative for fever or unexpected weight changes.  Respiratory: Negative for cough and shortness of breath.   Cardiovascular: Negative for chest pain.      Objective:   Physical Exam  BP 140/90  Pulse 89  Temp 98.1 F (36.7 C) (Oral)  Resp 16  Wt 202 lb 8 oz (91.853 kg)  SpO2 99% Wt Readings from Last 3 Encounters:  07/28/12 202 lb 8 oz (91.853 kg)  06/30/12 206 lb (93.441 kg)  05/26/12 204 lb (92.534 kg)   Constitutional:  appears well-developed and well-nourished. No distress. Gruff voice (smoker) Neck: Normal range of motion. Neck supple. No JVD present. No  thyromegaly present. nontender, no nodules Cardiovascular: Normal rate, regular rhythm and normal heart sounds.  No murmur heard. no BLE edema Pulmonary/Chest: Effort normal and breath sounds normal. No respiratory distress. no wheezes.  MSkel/skin: L hand with 1.5 cm soft growth (?granuloma vs inclusion cyst) on extensor surface of distal 4th finger over DIP near nailbed Neurological: he is alert and oriented to person, place, and time. No cranial nerve deficit. Coordination normal. speech and cognition normal Psychiatric: he has a normal mood and affect. behavior is normal. Judgment and thought content normal.   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 Medications and labs reviewed today.

## 2012-07-28 NOTE — Assessment & Plan Note (Signed)
Started atorva 04/2012 due to poor control and CVA hx -  Recheck lipids and LFTs now, titrate as needed

## 2012-07-28 NOTE — Assessment & Plan Note (Signed)
Started on beta-blocker 04/2011 - Still borderline - pt attributes again to knee pain today Add ARB to current regimen encouraged to monitor an call if SBP>140  BP Readings from Last 3 Encounters:  07/28/12 140/90  06/30/12 122/88  05/26/12 122/76

## 2012-07-28 NOTE — Patient Instructions (Addendum)
It was good to see you today. Test(s) ordered today. Your results will be released to MyChart (or called to you) after review, usually within 72hours after test completion. If any changes need to be made, you will be notified at that same time. Start losartan for blood pressure in addition to Metoprolol as ongoing - Your prescription(s) have been submitted to your pharmacy. Please take as directed and contact our office if you believe you are having problem(s) with the medication(s). Other Medications reviewed,  No other changes today Continue to work with Dr. Everardo All on thyroid problems and quit smoking  we'll make referral to hand specialist as requested . Our office will contact you regarding appointment(s) once made. Please schedule followup in 6 months for cholesterol and blood pressure check, call sooner if problems.

## 2012-08-11 ENCOUNTER — Encounter: Payer: Self-pay | Admitting: Endocrinology

## 2012-08-11 ENCOUNTER — Ambulatory Visit (INDEPENDENT_AMBULATORY_CARE_PROVIDER_SITE_OTHER): Payer: 59 | Admitting: Endocrinology

## 2012-08-11 VITALS — BP 122/82 | HR 84 | Temp 98.3°F | Wt 203.0 lb

## 2012-08-11 DIAGNOSIS — E890 Postprocedural hypothyroidism: Secondary | ICD-10-CM

## 2012-08-11 NOTE — Patient Instructions (Addendum)
blood tests are being requested for you today.  You will be contacted with results. Please come back for a follow-up appointment in 2 months.   Reduce the metoprolol to 1/2 of a 25 mg daily.  i have sent a prescription to your pharmacy.

## 2012-08-11 NOTE — Progress Notes (Signed)
  Subjective:    Patient ID: Jon Boyd, male    DOB: 1961-01-24, 51 y.o.   MRN: [id number]  HPI Pt is 6 months s/p i-131 rx for hyperthyroidism, due to grave's dz.  He has been on synthroid x 3 months.  pt states he feels much better in general.  Wheezing is less now Past Medical History  Diagnosis Date  . Hypertension   . Chronic headaches   . Hyperlipidemia   . Stroke   . Grave's disease 04/2011 dx    s/p I-131 tx 01/2012    Past Surgical History  Procedure Date  . Back surgery   . Ankle surgery     History   Social History  . Marital Status: Single    Spouse Name: N/A    Number of Children: N/A  . Years of Education: N/A   Occupational History  . Not on file.   Social History Main Topics  . Smoking status: Current Every Day Smoker -- 1.0 packs/day for 35 years    Types: Cigarettes  . Smokeless tobacco: Not on file  . Alcohol Use: Yes  . Drug Use: No  . Sexually Active: Not on file   Other Topics Concern  . Not on file   Social History Narrative  . No narrative on file    Current Outpatient Prescriptions on File Prior to Visit  Medication Sig Dispense Refill  . albuterol (PROVENTIL HFA;VENTOLIN HFA) 108 (90 BASE) MCG/ACT inhaler Inhale 2 puffs into the lungs every 6 (six) hours as needed for wheezing.  1 Inhaler  0  . amitriptyline (ELAVIL) 25 MG tablet Take 1-2 tablets (25-50 mg total) by mouth at bedtime.  60 tablet  3  . aspirin 81 MG chewable tablet Chew 81 mg by mouth daily.        Marland Kitchen atorvastatin (LIPITOR) 20 MG tablet Take 1 tablet (20 mg total) by mouth daily.  90 tablet  3  . losartan (COZAAR) 50 MG tablet Take 1 tablet (50 mg total) by mouth daily.  90 tablet  3  . metoprolol succinate (TOPROL-XL) 25 MG 24 hr tablet 1/2 tab daily        No Known Allergies  Family History  Problem Relation Age of Onset  . COPD Mother   . Hyperthyroidism Sister     BP 122/82  Pulse 84  Temp 98.3 F (36.8 C) (Oral)  Wt 203 lb (92.08 kg)  SpO2  96%   Review of Systems Denies weight change    Objective:   Physical Exam VITAL SIGNS:  See vs page GENERAL: no distress NECK: There is no palpable thyroid enlargement.  No thyroid nodule is palpable.  No palpable lymphadenopathy at the anterior neck.     Lab Results  Component Value Date   TSH 5.207* 08/11/2012      Assessment & Plan:  Post-i-131 hypothyroidism, needs increased rx

## 2012-08-12 LAB — TSH: TSH: 5.207 u[IU]/mL — ABNORMAL HIGH (ref 0.350–4.500)

## 2012-08-12 MED ORDER — LEVOTHYROXINE SODIUM 175 MCG PO TABS: 175.0000 ug | ORAL_TABLET | Freq: Every day | ORAL | Status: DC

## 2012-09-01 HISTORY — PX: FINGER SURGERY: SHX640

## 2012-09-22 ENCOUNTER — Telehealth: Payer: Self-pay

## 2012-09-22 NOTE — Telephone Encounter (Signed)
ok 

## 2012-09-22 NOTE — Telephone Encounter (Signed)
Pt states he is going to have surgery on his hand to remove a tumor, ortho doctor told him to stop aspirin 5 days prior to surgery, pt wants to know if this is ok with you.

## 2012-10-09 ENCOUNTER — Ambulatory Visit: Payer: 59 | Admitting: Internal Medicine

## 2012-10-16 ENCOUNTER — Ambulatory Visit (INDEPENDENT_AMBULATORY_CARE_PROVIDER_SITE_OTHER): Payer: 59 | Admitting: Endocrinology

## 2012-10-16 ENCOUNTER — Encounter: Payer: Self-pay | Admitting: Endocrinology

## 2012-10-16 VITALS — BP 136/78 | HR 75 | Wt 205.0 lb

## 2012-10-16 DIAGNOSIS — E890 Postprocedural hypothyroidism: Secondary | ICD-10-CM

## 2012-10-16 MED ORDER — LEVOTHYROXINE SODIUM 200 MCG PO TABS: 200.0000 ug | ORAL_TABLET | Freq: Every day | ORAL | Status: DC

## 2012-10-16 NOTE — Progress Notes (Signed)
  Subjective:    Patient ID: Jon Boyd, male    DOB: 01/24/1961, 51 y.o.   MRN: [id number]  HPI Pt is 8 months s/p i-131 rx for hyperthyroidism, due to grave's dz.  He has been on synthroid x 5 months.  pt states he feels well in general.   Past Medical History  Diagnosis Date  . Hypertension   . Chronic headaches   . Hyperlipidemia   . Stroke   . Grave's disease 04/2011 dx    s/p I-131 tx 01/2012    Past Surgical History  Procedure Date  . Back surgery   . Ankle surgery     History   Social History  . Marital Status: Single    Spouse Name: N/A    Number of Children: N/A  . Years of Education: N/A   Occupational History  . Not on file.   Social History Main Topics  . Smoking status: Current Every Day Smoker -- 1.0 packs/day for 35 years    Types: Cigarettes  . Smokeless tobacco: Not on file  . Alcohol Use: Yes  . Drug Use: No  . Sexually Active: Not on file   Other Topics Concern  . Not on file   Social History Narrative  . No narrative on file    Current Outpatient Prescriptions on File Prior to Visit  Medication Sig Dispense Refill  . albuterol (PROVENTIL HFA;VENTOLIN HFA) 108 (90 BASE) MCG/ACT inhaler Inhale 2 puffs into the lungs every 6 (six) hours as needed for wheezing.  1 Inhaler  0  . amitriptyline (ELAVIL) 25 MG tablet Take 1-2 tablets (25-50 mg total) by mouth at bedtime.  60 tablet  3  . aspirin 81 MG chewable tablet Chew 81 mg by mouth daily.        Marland Kitchen atorvastatin (LIPITOR) 20 MG tablet Take 1 tablet (20 mg total) by mouth daily.  90 tablet  3  . losartan (COZAAR) 50 MG tablet Take 1 tablet (50 mg total) by mouth daily.  90 tablet  3  . metoprolol succinate (TOPROL-XL) 25 MG 24 hr tablet 1/2 tab daily        No Known Allergies  Family History  Problem Relation Age of Onset  . COPD Mother   . Hyperthyroidism Sister     BP 136/78  Pulse 75  Wt 205 lb (92.987 kg)  SpO2 96%    Review of Systems Denies weight change    Objective:   Physical Exam VITAL SIGNS:  See vs page GENERAL: no distress NECK: There is no palpable thyroid enlargement.  No thyroid nodule is palpable.  No palpable lymphadenopathy at the anterior neck.  Lab Results  Component Value Date   TSH 6.576* 10/16/2012      Assessment & Plan:  Post-i-131 hypothyroidism, needs slightly increased rx

## 2012-10-16 NOTE — Patient Instructions (Addendum)
blood tests are being requested for you today.  We'll contact you with results. Dr Felicity Coyer would be happy to check your thyroid level once or twice a year. i would be happy to see you back here whenever you want.

## 2012-12-11 ENCOUNTER — Other Ambulatory Visit: Payer: Self-pay | Admitting: *Deleted

## 2012-12-11 ENCOUNTER — Other Ambulatory Visit: Payer: Self-pay

## 2012-12-11 MED ORDER — METOPROLOL SUCCINATE ER 25 MG PO TB24: 25.0000 mg | ORAL_TABLET | Freq: Every day | ORAL | Status: DC

## 2012-12-11 MED ORDER — METOPROLOL SUCCINATE ER 25 MG PO TB24: ORAL_TABLET | ORAL | Status: DC

## 2012-12-12 ENCOUNTER — Other Ambulatory Visit: Payer: Self-pay | Admitting: *Deleted

## 2012-12-12 MED ORDER — METOPROLOL SUCCINATE ER 25 MG PO TB24: 25.0000 mg | ORAL_TABLET | Freq: Every day | ORAL | Status: DC

## 2012-12-12 NOTE — Telephone Encounter (Signed)
Per patient states he takes one tablet metoprolol daily.

## 2013-01-03 ENCOUNTER — Other Ambulatory Visit: Payer: Self-pay

## 2013-01-03 MED ORDER — METOPROLOL SUCCINATE ER 25 MG PO TB24: 25.0000 mg | ORAL_TABLET | Freq: Every day | ORAL | Status: DC

## 2013-01-26 ENCOUNTER — Encounter: Payer: Self-pay | Admitting: Internal Medicine

## 2013-01-26 ENCOUNTER — Other Ambulatory Visit (INDEPENDENT_AMBULATORY_CARE_PROVIDER_SITE_OTHER): Payer: 59

## 2013-01-26 ENCOUNTER — Ambulatory Visit (INDEPENDENT_AMBULATORY_CARE_PROVIDER_SITE_OTHER): Payer: 59 | Admitting: Internal Medicine

## 2013-01-26 ENCOUNTER — Encounter: Payer: Self-pay | Admitting: Gastroenterology

## 2013-01-26 VITALS — BP 144/86 | HR 80 | Temp 99.5°F | Resp 16 | Ht 71.0 in | Wt 201.4 lb

## 2013-01-26 DIAGNOSIS — R7309 Other abnormal glucose: Secondary | ICD-10-CM

## 2013-01-26 DIAGNOSIS — I10 Essential (primary) hypertension: Secondary | ICD-10-CM

## 2013-01-26 DIAGNOSIS — Z0000 Encounter for general adult medical examination without abnormal findings: Secondary | ICD-10-CM

## 2013-01-26 DIAGNOSIS — F411 Generalized anxiety disorder: Secondary | ICD-10-CM

## 2013-01-26 DIAGNOSIS — R739 Hyperglycemia, unspecified: Secondary | ICD-10-CM

## 2013-01-26 DIAGNOSIS — E890 Postprocedural hypothyroidism: Secondary | ICD-10-CM

## 2013-01-26 DIAGNOSIS — E785 Hyperlipidemia, unspecified: Secondary | ICD-10-CM

## 2013-01-26 DIAGNOSIS — Z1211 Encounter for screening for malignant neoplasm of colon: Secondary | ICD-10-CM

## 2013-01-26 LAB — URINALYSIS, ROUTINE W REFLEX MICROSCOPIC
Hgb urine dipstick: NEGATIVE
Ketones, ur: NEGATIVE
Specific Gravity, Urine: 1.02 (ref 1.000–1.030)
Total Protein, Urine: NEGATIVE
Urine Glucose: NEGATIVE
Urobilinogen, UA: 0.2 (ref 0.0–1.0)

## 2013-01-26 LAB — LIPID PANEL
Cholesterol: 169 mg/dL (ref 0–200)
VLDL: 24.6 mg/dL (ref 0.0–40.0)

## 2013-01-26 LAB — CBC WITH DIFFERENTIAL/PLATELET
Basophils Absolute: 0 10*3/uL (ref 0.0–0.1)
Eosinophils Relative: 1.2 % (ref 0.0–5.0)
HCT: 37.4 % — ABNORMAL LOW (ref 39.0–52.0)
Hemoglobin: 12.6 g/dL — ABNORMAL LOW (ref 13.0–17.0)
Lymphocytes Relative: 31.9 % (ref 12.0–46.0)
Lymphs Abs: 2.3 10*3/uL (ref 0.7–4.0)
Monocytes Relative: 9 % (ref 3.0–12.0)
Neutro Abs: 4.1 10*3/uL (ref 1.4–7.7)
RBC: 3.88 Mil/uL — ABNORMAL LOW (ref 4.22–5.81)
WBC: 7.2 10*3/uL (ref 4.5–10.5)

## 2013-01-26 LAB — TSH: TSH: 2.03 u[IU]/mL (ref 0.35–5.50)

## 2013-01-26 LAB — HEPATIC FUNCTION PANEL
ALT: 35 U/L (ref 0–53)
Bilirubin, Direct: 0.2 mg/dL (ref 0.0–0.3)
Total Protein: 7.6 g/dL (ref 6.0–8.3)

## 2013-01-26 LAB — BASIC METABOLIC PANEL
CO2: 24 mEq/L (ref 19–32)
Calcium: 9.4 mg/dL (ref 8.4–10.5)
Chloride: 102 mEq/L (ref 96–112)
Creatinine, Ser: 1 mg/dL (ref 0.4–1.5)
Glucose, Bld: 121 mg/dL — ABNORMAL HIGH (ref 70–99)

## 2013-01-26 LAB — HEMOGLOBIN A1C: Hgb A1c MFr Bld: 6.1 % (ref 4.6–6.5)

## 2013-01-26 MED ORDER — ALPRAZOLAM 0.5 MG PO TABS: 0.5000 mg | ORAL_TABLET | Freq: Two times a day (BID) | ORAL | Status: DC | PRN

## 2013-01-26 NOTE — Assessment & Plan Note (Signed)
Started on beta-blocker 04/2011 - Still borderline - pt attributes again to knee pain today Added ARB 07/2012 - irregular use There have been some probable medication, dietary and lifestyle compliance issues here. I have discussed with him the great importance of following the treatment plan exactly as directed in order to achieve a good medical outcome. encouraged to monitor an call if SBP>140  BP Readings from Last 3 Encounters:  01/26/13 144/86  10/16/12 136/78  08/11/12 122/82

## 2013-01-26 NOTE — Assessment & Plan Note (Signed)
Started atorva 04/2012 due to poor control and CVA hx -  Improved at last check Recheck lipids and LFTs now with CPX, titrate as needed The current medical regimen is effective;  continue present plan and medications.  

## 2013-01-26 NOTE — Assessment & Plan Note (Signed)
Prior Graves hyperthyroid Incidental dx on labs 04/2011 -  on MMZ until 10/2011 - then I-131 01/2012 ablation On replacement since late summer 2013  Lab Results  Component Value Date   TSH 6.576* 10/16/2012

## 2013-01-26 NOTE — Progress Notes (Signed)
Pt requested his Alprazolam be called to [facility] on [address]

## 2013-01-26 NOTE — Patient Instructions (Addendum)
It was good to see you today. We have reviewed your prior records including labs and tests today Health Maintenance reviewed - all recommended immunizations and age-appropriate screenings are up-to-date. we'll make referral for colonoscopy screening. Our office will contact you regarding appointment(s) once made. Test(s) ordered today. Your results will be released to MyChart (or called to you) after review, usually within 72hours after test completion. If any changes need to be made, you will be notified at that same time. Medications reviewed and updated, no changes recommended today - new prescription for low-dose Xanax to use as needed for panic attack and anxiety symptoms Your prescription(s) have been submitted to your pharmacy. Please take as directed and contact our office if you believe you are having problem(s) with the medication(s). Continue to work on quitting smoking  Please schedule followup in 12 months for annual visit including cholesterol and blood pressure check, call sooner if problems.  Health Maintenance, Males A healthy lifestyle and preventative care can promote health and wellness.  Maintain regular health, dental, and eye exams.  Eat a healthy diet. Foods like vegetables, fruits, whole grains, low-fat dairy products, and lean protein foods contain the nutrients you need without too many calories. Decrease your intake of foods high in solid fats, added sugars, and salt. Get information about a proper diet from your caregiver, if necessary.  Regular physical exercise is one of the most important things you can do for your health. Most adults should get at least 150 minutes of moderate-intensity exercise (any activity that increases your heart rate and causes you to sweat) each week. In addition, most adults need muscle-strengthening exercises on 2 or more days a week.   Maintain a healthy weight. The body mass index (BMI) is a screening tool to identify possible weight  problems. It provides an estimate of body fat based on height and weight. Your caregiver can help determine your BMI, and can help you achieve or maintain a healthy weight. For adults 20 years and older:  A BMI below 18.5 is considered underweight.  A BMI of 18.5 to 24.9 is normal.  A BMI of 25 to 29.9 is considered overweight.  A BMI of 30 and above is considered obese.  Maintain normal blood lipids and cholesterol by exercising and minimizing your intake of saturated fat. Eat a balanced diet with plenty of fruits and vegetables. Blood tests for lipids and cholesterol should begin at age 68 and be repeated every 5 years. If your lipid or cholesterol levels are high, you are over 50, or you are a high risk for heart disease, you may need your cholesterol levels checked more frequently.Ongoing high lipid and cholesterol levels should be treated with medicines, if diet and exercise are not effective.  If you smoke, find out from your caregiver how to quit. If you do not use tobacco, do not start.  If you choose to drink alcohol, do not exceed 2 drinks per day. One drink is considered to be 12 ounces (355 mL) of beer, 5 ounces (148 mL) of wine, or 1.5 ounces (44 mL) of liquor.  Avoid use of street drugs. Do not share needles with anyone. Ask for help if you need support or instructions about stopping the use of drugs.  High blood pressure causes heart disease and increases the risk of stroke. Blood pressure should be checked at least every 1 to 2 years. Ongoing high blood pressure should be treated with medicines if weight loss and exercise are not effective.  If you are 61 to 52 years old, ask your caregiver if you should take aspirin to prevent heart disease.  Diabetes screening involves taking a blood sample to check your fasting blood sugar level. This should be done once every 3 years, after age 3, if you are within normal weight and without risk factors for diabetes. Testing should be  considered at a younger age or be carried out more frequently if you are overweight and have at least 1 risk factor for diabetes.  Colorectal cancer can be detected and often prevented. Most routine colorectal cancer screening begins at the age of 4 and continues through age 67. However, your caregiver may recommend screening at an earlier age if you have risk factors for colon cancer. On a yearly basis, your caregiver may provide home test kits to check for hidden blood in the stool. Use of a small camera at the end of a tube, to directly examine the colon (sigmoidoscopy or colonoscopy), can detect the earliest forms of colorectal cancer. Talk to your caregiver about this at age 21, when routine screening begins. Direct examination of the colon should be repeated every 5 to 10 years through age 42, unless early forms of pre-cancerous polyps or small growths are found.  Hepatitis C blood testing is recommended for all people born from [birth year] through 1965 and any individual with known risks for hepatitis C.  Healthy men should no longer receive prostate-specific antigen (PSA) blood tests as part of routine cancer screening. Consult with your caregiver about prostate cancer screening.  Testicular cancer screening is not recommended for adolescents or adult males who have no symptoms. Screening includes self-exam, caregiver exam, and other screening tests. Consult with your caregiver about any symptoms you have or any concerns you have about testicular cancer.  Practice safe sex. Use condoms and avoid high-risk sexual practices to reduce the spread of sexually transmitted infections (STIs).  Use sunscreen with a sun protection factor (SPF) of 30 or greater. Apply sunscreen liberally and repeatedly throughout the day. You should seek shade when your shadow is shorter than you. Protect yourself by wearing long sleeves, pants, a wide-brimmed hat, and sunglasses year round, whenever you are outdoors.  Notify  your caregiver of new moles or changes in moles, especially if there is a change in shape or color. Also notify your caregiver if a mole is larger than the size of a pencil eraser.  A one-time screening for abdominal aortic aneurysm (AAA) and surgical repair of large AAAs by sound wave imaging (ultrasonography) is recommended for ages 37 to 23 years who are current or former smokers.  Stay current with your immunizations. Document Released: 04/15/2008 Document Revised: 01/10/2012 Document Reviewed: 03/15/2011 [facility] Patient Information 2013 [place], [place]. You Can Quit Smoking If you are ready to quit smoking or are thinking about it, congratulations! You have chosen to help yourself be healthier and live longer! There are lots of different ways to quit smoking. Nicotine gum, nicotine patches, a nicotine inhaler, or nicotine nasal spray can help with physical craving. Hypnosis, support groups, and medicines help break the habit of smoking. TIPS TO GET OFF AND STAY OFF CIGARETTES  Learn to predict your moods. Do not let a bad situation be your excuse to have a cigarette. Some situations in your life might tempt you to have a cigarette.  Ask friends and co-workers not to smoke around you.  Make your home smoke-free.  Never have "just one" cigarette. It leads to wanting another and  another. Remind yourself of your decision to quit.  On a card, make a list of your reasons for not smoking. Read it at least the same number of times a day as you have a cigarette. Tell yourself everyday, "I do not want to smoke. I choose not to smoke."  Ask someone at home or work to help you with your plan to quit smoking.  Have something planned after you eat or have a cup of coffee. Take a walk or get other exercise to perk you up. This will help to keep you from overeating.  Try a relaxation exercise to calm you down and decrease your stress. Remember, you may be tense and nervous the first two weeks after  you quit. This will pass.  Find new activities to keep your hands busy. Play with a pen, coin, or rubber band. Doodle or draw things on paper.  Brush your teeth right after eating. This will help cut down the craving for the taste of tobacco after meals. You can try mouthwash too.  Try gum, breath mints, or diet candy to keep something in your mouth. IF YOU SMOKE AND WANT TO QUIT:  Do not stock up on cigarettes. Never buy a carton. Wait until one pack is finished before you buy another.  Never carry cigarettes with you at work or at home.  Keep cigarettes as far away from you as possible. Leave them with someone else.  Never carry matches or a lighter with you.  Ask yourself, "Do I need this cigarette or is this just a reflex?"  Bet with someone that you can quit. Put cigarette money in a piggy bank every morning. If you smoke, you give up the money. If you do not smoke, by the end of the week, you keep the money.  Keep trying. It takes 21 days to change a habit!  Talk to your doctor about using medicines to help you quit. These include nicotine replacement gum, lozenges, or skin patches. Document Released: 08/14/2009 Document Revised: 01/10/2012 Document Reviewed: 08/14/2009 [facility] Patient Information 2013 [place], [place].

## 2013-01-26 NOTE — Progress Notes (Signed)
Subjective:    Patient ID: Jon Boyd, male    DOB: 1961-06-25, 52 y.o.   MRN: [id number]  HPI  patient is here today for annual physical. Patient feels well overall.  Also reviewed chronic medical issues:  CVA hx - manifest as L ear pain and hearing loss with headache - [facility] ENT then neuro due to abnormal finding on MRI (CVA) - no embolic source on echo 05/26/11  hypertension - on beta-blocker for headache and tremor, started same 05/2011 - the patient reports compliance with medication(s) as prescribed. Denies adverse side effects.  dyslipidemia - started atorvastatin 04/2012 due to CVA hx - the patient reports compliance with medication(s) as prescribed. Denies adverse side effects.  postablative hypothyroid - hx hyperthyroid dz, Grave's dz dx with iIncidental low TSH on 04/30/11 labs - started [facility] 05/2011, stopped 10/2011 due to overcorrected TSH; then I-131 tx 01/2012- working with endo on same  COPD - bronchospam hx - previous prescription dulera but unable to appreciate improvement on tx or decline off med - stopped same September 2012 - still smoking but aware of need to consider same,  Uses rescue inhaler if needed 2-3x/week   Past Medical History  Diagnosis Date  . Hypertension   . Chronic headaches   . Hyperlipidemia   . Stroke   . Grave's disease 04/2011    s/p I-131 tx 01/2012  . Postablative hypothyroidism    Family History  Problem Relation Age of Onset  . COPD Mother   . Hyperthyroidism Sister    History  Substance Use Topics  . Smoking status: Current Every Day Smoker -- 1.00 packs/day for 35 years    Types: Cigarettes  . Smokeless tobacco: Not on file  . Alcohol Use: Yes    Review of Systems  Constitutional: Negative for fever or weight change.  Respiratory: Negative for cough and shortness of breath.   Cardiovascular: Negative for chest pain or palpitations.  Gastrointestinal: Negative for abdominal pain, no bowel changes.  Musculoskeletal:  Negative for gait problem or joint swelling.  Skin: Negative for rash.  Neurological: Negative for dizziness or headache.  Psyc: + anxiety and panic attacks, new in past 6 weeks - related to stress, +hx same No other specific complaints in a complete review of systems (except as listed in HPI above).      Objective:   Physical Exam  BP 144/86  Pulse 80  Temp(Src) 99.5 F (37.5 C) (Oral)  Resp 16  Ht 5\' 11"  (1.803 m)  Wt 201 lb 6.4 oz (91.354 kg)  BMI 28.1 kg/m2  SpO2 96% Wt Readings from Last 3 Encounters:  01/26/13 201 lb 6.4 oz (91.354 kg)  10/16/12 205 lb (92.987 kg)  08/11/12 203 lb (92.08 kg)   Constitutional:  appears well-developed and well-nourished. No distress. Gruff voice (smoker) HENT: Head: Normocephalic and atraumatic. Ears: B TMs ok, no erythema or effusion; Nose: Nose normal. Mouth/Throat: Oropharynx is clear and moist. No oropharyngeal exudate.  Eyes: Conjunctivae and EOM are normal. Pupils are equal, round, and reactive to light. No scleral icterus.  Neck: Normal range of motion. Neck supple. No JVD present. No thyromegaly present. nontender, no nodules Cardiovascular: Normal rate, regular rhythm and normal heart sounds.  No murmur heard. no BLE edema Pulmonary/Chest: Effort normal and breath sounds normal. No respiratory distress. no wheezes.   Abdominal: Soft. Bowel sounds are normal. he exhibits no distension. There is no tenderness. no masses Musculoskeletal: Normal range of motion, no joint effusions.  No gross deformitiesNeurological: he is alert and oriented to person, place, and time. No cranial nerve deficit. Coordination normal. speech and cognition normal Psychiatric: he has a normal mood and affect. behavior is normal. Judgment and thought content normal.   Lab Results  Component Value Date   WBC 6.7 06/14/2011   HGB 14.5 06/14/2011   HCT 42.7 06/14/2011   PLT 217.0 06/14/2011   CHOL 163 07/28/2012   TRIG 105.0 07/28/2012   HDL 54.70 07/28/2012    LDLDIRECT 170.9 06/14/2011   ALT 42 07/28/2012   AST 64* 07/28/2012   NA 138 06/14/2011   K 4.4 06/14/2011   CL 102 06/14/2011   CREATININE 0.7 06/14/2011   BUN 8 06/14/2011   CO2 27 06/14/2011   TSH 6.576* 10/16/2012   PSA 0.26 06/14/2011   HGBA1C 6.1* 04/30/2011       Assessment & Plan:  CPX/v70.0 - Patient has been counseled on age-appropriate routine health concerns for screening and prevention. These are reviewed and up-to-date. Immunizations are up-to-date or declined. Labs ordered and reviewed.  Anxiety - panic attacks, history of same - recheck labs to exclude metabolic abnormality - prescribed low-dose Xanax to use when necessary. If symptoms persist or Xanax ineffective, recommend followup visit to consider SSRI therapy. Discussed role of counseling, patient declines need for same at this time  Hyperglycemia. Subsequent weight loss since that time. Recheck A1c now - continue diet and exercise control of same  Also see problem list. Medications and labs reviewed today.

## 2013-03-02 ENCOUNTER — Encounter: Payer: Self-pay | Admitting: Gastroenterology

## 2013-03-02 ENCOUNTER — Ambulatory Visit (AMBULATORY_SURGERY_CENTER): Payer: 59 | Admitting: *Deleted

## 2013-03-02 VITALS — Ht 71.0 in | Wt 197.8 lb

## 2013-03-02 DIAGNOSIS — Z1211 Encounter for screening for malignant neoplasm of colon: Secondary | ICD-10-CM

## 2013-03-02 MED ORDER — NA SULFATE-K SULFATE-MG SULF 17.5-3.13-1.6 GM/177ML PO SOLN
ORAL | Status: DC
Start: 2013-03-02 — End: 2014-01-29

## 2013-03-16 ENCOUNTER — Encounter: Payer: 59 | Admitting: Gastroenterology

## 2013-05-26 ENCOUNTER — Other Ambulatory Visit: Payer: Self-pay | Admitting: Internal Medicine

## 2013-05-28 ENCOUNTER — Other Ambulatory Visit: Payer: Self-pay | Admitting: *Deleted

## 2013-05-28 MED ORDER — ALPRAZOLAM 0.5 MG PO TABS: 0.5000 mg | ORAL_TABLET | Freq: Two times a day (BID) | ORAL | Status: DC | PRN

## 2013-05-28 MED ORDER — METOPROLOL SUCCINATE ER 25 MG PO TB24: 25.0000 mg | ORAL_TABLET | Freq: Every day | ORAL | Status: DC

## 2013-05-28 NOTE — Telephone Encounter (Signed)
[facility]...lmb 

## 2013-08-10 ENCOUNTER — Other Ambulatory Visit: Payer: Self-pay | Admitting: Internal Medicine

## 2013-08-25 ENCOUNTER — Other Ambulatory Visit: Payer: Self-pay | Admitting: Internal Medicine

## 2013-08-27 NOTE — Telephone Encounter (Signed)
[facility].../lmb 

## 2013-09-28 ENCOUNTER — Other Ambulatory Visit: Payer: Self-pay | Admitting: Internal Medicine

## 2013-11-26 ENCOUNTER — Other Ambulatory Visit: Payer: Self-pay

## 2013-11-26 MED ORDER — LEVOTHYROXINE SODIUM 200 MCG PO TABS: 200.0000 ug | ORAL_TABLET | Freq: Every day | ORAL | Status: DC

## 2014-01-07 ENCOUNTER — Other Ambulatory Visit: Payer: Self-pay

## 2014-01-29 ENCOUNTER — Encounter: Payer: Self-pay | Admitting: Gastroenterology

## 2014-01-29 ENCOUNTER — Encounter: Payer: Self-pay | Admitting: Internal Medicine

## 2014-01-29 ENCOUNTER — Telehealth: Payer: Self-pay | Admitting: Internal Medicine

## 2014-01-29 ENCOUNTER — Encounter: Payer: Self-pay | Admitting: *Deleted

## 2014-01-29 ENCOUNTER — Ambulatory Visit (INDEPENDENT_AMBULATORY_CARE_PROVIDER_SITE_OTHER): Payer: BC Managed Care – PPO | Admitting: Internal Medicine

## 2014-01-29 ENCOUNTER — Other Ambulatory Visit (INDEPENDENT_AMBULATORY_CARE_PROVIDER_SITE_OTHER): Payer: BC Managed Care – PPO

## 2014-01-29 VITALS — BP 142/80 | HR 81 | Temp 98.2°F | Wt 204.8 lb

## 2014-01-29 DIAGNOSIS — Z1211 Encounter for screening for malignant neoplasm of colon: Secondary | ICD-10-CM

## 2014-01-29 DIAGNOSIS — E890 Postprocedural hypothyroidism: Secondary | ICD-10-CM

## 2014-01-29 DIAGNOSIS — E785 Hyperlipidemia, unspecified: Secondary | ICD-10-CM

## 2014-01-29 DIAGNOSIS — R945 Abnormal results of liver function studies: Secondary | ICD-10-CM

## 2014-01-29 DIAGNOSIS — I10 Essential (primary) hypertension: Secondary | ICD-10-CM

## 2014-01-29 DIAGNOSIS — Z0000 Encounter for general adult medical examination without abnormal findings: Secondary | ICD-10-CM

## 2014-01-29 DIAGNOSIS — R160 Hepatomegaly, not elsewhere classified: Secondary | ICD-10-CM

## 2014-01-29 DIAGNOSIS — R7989 Other specified abnormal findings of blood chemistry: Secondary | ICD-10-CM

## 2014-01-29 LAB — CBC WITH DIFFERENTIAL/PLATELET
BASOS ABS: 0 10*3/uL (ref 0.0–0.1)
BASOS PCT: 0.3 % (ref 0.0–3.0)
Eosinophils Absolute: 0.1 10*3/uL (ref 0.0–0.7)
Eosinophils Relative: 1 % (ref 0.0–5.0)
HCT: 38.7 % — ABNORMAL LOW (ref 39.0–52.0)
Hemoglobin: 13.2 g/dL (ref 13.0–17.0)
Lymphocytes Relative: 29.8 % (ref 12.0–46.0)
Lymphs Abs: 2.2 10*3/uL (ref 0.7–4.0)
MCHC: 34.1 g/dL (ref 30.0–36.0)
MCV: 102.6 fl — AB (ref 78.0–100.0)
MONO ABS: 0.8 10*3/uL (ref 0.1–1.0)
Monocytes Relative: 11.3 % (ref 3.0–12.0)
Neutro Abs: 4.2 10*3/uL (ref 1.4–7.7)
Neutrophils Relative %: 57.6 % (ref 43.0–77.0)
Platelets: 144 10*3/uL — ABNORMAL LOW (ref 150.0–400.0)
RBC: 3.77 Mil/uL — AB (ref 4.22–5.81)
RDW: 17.3 % — ABNORMAL HIGH (ref 11.5–14.6)
WBC: 7.4 10*3/uL (ref 4.5–10.5)

## 2014-01-29 LAB — LIPID PANEL
Cholesterol: 148 mg/dL (ref 0–200)
HDL: 52.9 mg/dL (ref >39.00)
LDL Cholesterol: 55 mg/dL (ref 0–99)
Total CHOL/HDL Ratio: 3
Triglycerides: 200 mg/dL — ABNORMAL HIGH (ref 0.0–149.0)
VLDL: 40 mg/dL (ref 0.0–40.0)

## 2014-01-29 LAB — URINALYSIS, ROUTINE W REFLEX MICROSCOPIC
Bilirubin Urine: NEGATIVE
HGB URINE DIPSTICK: NEGATIVE
KETONES UR: NEGATIVE
LEUKOCYTES UA: NEGATIVE
NITRITE: NEGATIVE
PH: 6 (ref 5.0–8.0)
RBC / HPF: NONE SEEN (ref 0–?)
Specific Gravity, Urine: 1.02 (ref 1.000–1.030)
Total Protein, Urine: NEGATIVE
UROBILINOGEN UA: 1 (ref 0.0–1.0)
Urine Glucose: NEGATIVE
WBC, UA: NONE SEEN (ref 0–?)

## 2014-01-29 LAB — HEPATIC FUNCTION PANEL
ALBUMIN: 3.4 g/dL — AB (ref 3.5–5.2)
ALT: 60 U/L — ABNORMAL HIGH (ref 0–53)
AST: 117 U/L — ABNORMAL HIGH (ref 0–37)
Alkaline Phosphatase: 232 U/L — ABNORMAL HIGH (ref 39–117)
BILIRUBIN DIRECT: 0.3 mg/dL (ref 0.0–0.3)
TOTAL PROTEIN: 7.5 g/dL (ref 6.0–8.3)
Total Bilirubin: 1 mg/dL (ref 0.3–1.2)

## 2014-01-29 LAB — BASIC METABOLIC PANEL
BUN: 14 mg/dL (ref 6–23)
CALCIUM: 8.6 mg/dL (ref 8.4–10.5)
CHLORIDE: 106 meq/L (ref 96–112)
CO2: 23 meq/L (ref 19–32)
Creatinine, Ser: 0.9 mg/dL (ref 0.4–1.5)
GFR: 98.9 mL/min (ref >60.00)
GLUCOSE: 123 mg/dL — AB (ref 70–99)
Potassium: 3.9 mEq/L (ref 3.5–5.1)
Sodium: 139 mEq/L (ref 135–145)

## 2014-01-29 LAB — TSH: TSH: 1.46 u[IU]/mL (ref 0.35–5.50)

## 2014-01-29 MED ORDER — LOSARTAN POTASSIUM 50 MG PO TABS: ORAL_TABLET | ORAL | Status: DC

## 2014-01-29 MED ORDER — LEVOTHYROXINE SODIUM 200 MCG PO TABS: 200.0000 ug | ORAL_TABLET | Freq: Every day | ORAL | Status: DC

## 2014-01-29 MED ORDER — ATORVASTATIN CALCIUM 20 MG PO TABS: ORAL_TABLET | ORAL | Status: DC

## 2014-01-29 MED ORDER — ALPRAZOLAM 0.5 MG PO TABS: 0.5000 mg | ORAL_TABLET | Freq: Two times a day (BID) | ORAL | Status: DC | PRN

## 2014-01-29 MED ORDER — METOPROLOL SUCCINATE ER 25 MG PO TB24: 25.0000 mg | ORAL_TABLET | Freq: Every day | ORAL | Status: DC

## 2014-01-29 NOTE — Progress Notes (Signed)
Subjective:    Patient ID: Jon Boyd, male    DOB: 1961/03/03, 53 y.o.   MRN: [id number]  HPI  patient is here today for annual physical. Patient feels well and has no complaints.  Also reviewed chronic medical issues and interval medical events  Past Medical History  Diagnosis Date  . Hypertension   . Chronic headaches   . Hyperlipidemia   . Stroke   . Grave's disease 04/2011    s/p I-131 tx 01/2012  . Postablative hypothyroidism    Family History  Problem Relation Age of Onset  . COPD Mother   . Hyperthyroidism Sister   . Colon cancer Father 40   History  Substance Use Topics  . Smoking status: Current Every Day Smoker -- 1.00 packs/day for 35 years    Types: Cigarettes  . Smokeless tobacco: Never Used  . Alcohol Use: 8.4 oz/week    14 Cans of beer per week    Review of Systems  Constitutional: Negative for fever, activity change, appetite change, fatigue and unexpected weight change.  Respiratory: Negative for cough, chest tightness, shortness of breath and wheezing.   Cardiovascular: Negative for chest pain, palpitations and leg swelling.  Musculoskeletal:       Occ muscle cramps, better with mustard  Neurological: Negative for dizziness, weakness and headaches.  Psychiatric/Behavioral: Negative for dysphoric mood. The patient is not nervous/anxious.   All other systems reviewed and are negative.       Objective:   Physical Exam  BP 142/80  Pulse 81  Temp(Src) 98.2 F (36.8 C) (Oral)  Wt 204 lb 12.8 oz (92.897 kg)  SpO2 97% Wt Readings from Last 3 Encounters:  01/29/14 204 lb 12.8 oz (92.897 kg)  03/02/13 197 lb 12.8 oz (89.721 kg)  01/26/13 201 lb 6.4 oz (91.354 kg)   Constitutional: he appears well-developed and well-nourished. No distress.  HENT: Head: Normocephalic and atraumatic. Ears: B TMs ok, no erythema or effusion; Nose: Nose normal. Mouth/Throat: Oropharynx is clear and moist. No oropharyngeal exudate.  Eyes: Conjunctivae and EOM are  normal. Pupils are equal, round, and reactive to light. No scleral icterus.  Neck: Normal range of motion. Neck supple. No JVD present. No thyromegaly present.  Cardiovascular: Normal rate, regular rhythm and normal heart sounds.  No murmur heard. No BLE edema. Pulmonary/Chest: Effort normal and breath sounds normal. No respiratory distress. he has no wheezes.  Abdominal: Soft. Bowel sounds are normal. he exhibits no distension. There is no tenderness. no masses Musculoskeletal: Normal range of motion, no joint effusions. No gross deformities Neurological: he is alert and oriented to person, place, and time. No cranial nerve deficit. Coordination, balance, strength, speech and gait are normal.  Skin: Skin is warm and dry. No rash noted. No erythema.  Psychiatric: he has a normal mood and affect. behavior is normal. Judgment and thought content normal.  Lab Results  Component Value Date   WBC 7.2 01/26/2013   HGB 12.6* 01/26/2013   HCT 37.4* 01/26/2013   PLT 234.0 01/26/2013   GLUCOSE 121* 01/26/2013   CHOL 169 01/26/2013   TRIG 123.0 01/26/2013   HDL 50.90 01/26/2013   LDLDIRECT 170.9 06/14/2011   LDLCALC 94 01/26/2013   ALT 35 01/26/2013   AST 48* 01/26/2013   NA 136 01/26/2013   K 4.8 01/26/2013   CL 102 01/26/2013   CREATININE 1.0 01/26/2013   BUN 14 01/26/2013   CO2 24 01/26/2013   TSH 2.03 01/26/2013   PSA 0.26 06/14/2011  HGBA1C 6.1 01/26/2013    Dg Knee Complete 4 Views Left  04/28/2012   *RADIOLOGY REPORT*  Clinical Data: Left knee pain for 3 weeks, no known injury  LEFT KNEE - COMPLETE 4+ VIEW  Comparison: None.  Findings: Four views of the left knee submitted.  No acute fracture or subluxation.  Mild narrowing of medial joint compartment.  Mild spurring of patella.  No convincing joint effusion.  IMPRESSION: No acute fracture or subluxation.  Mild narrowing of medial joint compartment.  Mild spurring of patella.  Original Report Authenticated By: Lahoma Crocker, M.D.      Assessment & Plan:     CPX/v70.0 - Patient has been counseled on age-appropriate routine health concerns for screening and prevention. These are reviewed and up-to-date. Immunizations are up-to-date or declined. Labs ordered and reviewed.  refer for colo screen  Problem List Items Addressed This Visit   Dyslipidemia     Started atorva 04/2012 due to poor control and CVA hx -  Improved at last check Recheck lipids and LFTs now with CPX, titrate as needed The current medical regimen is effective;  continue present plan and medications.     Relevant Medications      atorvastatin (LIPITOR) tablet   Other Relevant Orders      Lipid panel   Hypertension      Started on beta-blocker 04/2011 - Added ARB 07/2012 - irregular use There have been some probable medication, dietary and lifestyle compliance issues here. I have discussed with him the great importance of following the treatment plan exactly as directed in order to achieve a good medical outcome. encouraged to monitor an call if SBP>140  BP Readings from Last 3 Encounters:  01/29/14 142/80  01/26/13 144/86  10/16/12 136/78      Relevant Medications      metoprolol succinate (TOPROL-XL) 24 hr tablet      losartan (COZAAR) tablet   Other Relevant Orders      Basic metabolic panel      Lipid panel   Other postablative hypothyroidism      Prior Graves hyperthyroid Incidental dx on labs 04/2011 -  on MMZ until 10/2011 - then I-131 01/2012 ablation On replacement thyroid since late summer 2013 Check TSH and adjust as needed  Lab Results  Component Value Date   TSH 2.03 01/26/2013      Relevant Medications      levothyroxine (SYNTHROID, LEVOTHROID) tablet   Other Relevant Orders      TSH    Other Visit Diagnoses   Routine general medical examination at a health care facility    -  Primary    Relevant Orders       Basic metabolic panel       CBC with Differential       Hepatic function panel       Lipid panel       TSH       Urinalysis,  Routine w reflex microscopic    Special screening for malignant neoplasms, colon        Relevant Orders       Ambulatory referral to Gastroenterology

## 2014-01-29 NOTE — Progress Notes (Signed)
Pre visit review using our clinic review tool, if applicable. No additional management support is needed unless otherwise documented below in the visit note. 

## 2014-01-29 NOTE — Telephone Encounter (Signed)
Relevant patient education mailed to patient.  

## 2014-01-29 NOTE — Assessment & Plan Note (Signed)
Started on beta-blocker 04/2011 - Added ARB 07/2012 - irregular use There have been some probable medication, dietary and lifestyle compliance issues here. I have discussed with him the great importance of following the treatment plan exactly as directed in order to achieve a good medical outcome. encouraged to monitor an call if SBP>140  BP Readings from Last 3 Encounters:  01/29/14 142/80  01/26/13 144/86  10/16/12 136/78

## 2014-01-29 NOTE — Patient Instructions (Addendum)
It was good to see you today.  We have reviewed your prior records including labs and tests today  Health Maintenance reviewed - all recommended immunizations and age-appropriate screenings are up-to-date or declined.  Test(s) ordered today. Your results will be released to [place] (or called to you) after review, usually within 72hours after test completion. If any changes need to be made, you will be notified at that same time.  Medications reviewed and updated, no changes recommended at this time. Refill on medication(s) as discussed today.  we'll make referral to gastroenterology for screening colonoscopy. Our office will contact you regarding appointment(s) once made.  Please schedule followup in 12 months for annual exam and labs, call sooner if problems.  Health Maintenance, Males A healthy lifestyle and preventative care can promote health and wellness.  Maintain regular health, dental, and eye exams.  Eat a healthy diet. Foods like vegetables, fruits, whole grains, low-fat dairy products, and lean protein foods contain the nutrients you need and are low in calories. Decrease your intake of foods high in solid fats, added sugars, and salt. Get information about a proper diet from your health care provider, if necessary.  Regular physical exercise is one of the most important things you can do for your health. Most adults should get at least 150 minutes of moderate-intensity exercise (any activity that increases your heart rate and causes you to sweat) each week. In addition, most adults need muscle-strengthening exercises on 2 or more days a week.   Maintain a healthy weight. The body mass index (BMI) is a screening tool to identify possible weight problems. It provides an estimate of body fat based on height and weight. Your health care provider can find your BMI and can help you achieve or maintain a healthy weight. For males 20 years and older:  A BMI below 18.5 is considered  underweight.  A BMI of 18.5 to 24.9 is normal.  A BMI of 25 to 29.9 is considered overweight.  A BMI of 30 and above is considered obese.  Maintain normal blood lipids and cholesterol by exercising and minimizing your intake of saturated fat. Eat a balanced diet with plenty of fruits and vegetables. Blood tests for lipids and cholesterol should begin at age 29 and be repeated every 5 years. If your lipid or cholesterol levels are high, you are over 50, or you are at high risk for heart disease, you may need your cholesterol levels checked more frequently.Ongoing high lipid and cholesterol levels should be treated with medicines, if diet and exercise are not working.  If you smoke, find out from your health care provider how to quit. If you do not use tobacco, do not start.  Lung cancer screening is recommended for adults aged 45 80 years who are at high risk for developing lung cancer because of a history of smoking. A yearly low-dose CT scan of the lungs is recommended for people who have at least a 30-pack-year history of smoking and are a current smoker or have quit within the past 15 years. A pack year of smoking is smoking an average of 1 pack of cigarettes a day for 1 year (for example, a 30-pack-year history of smoking could mean smoking 1 pack a day for 30 years or 2 packs a day for 15 years). Yearly screening should continue until the smoker has stopped smoking for at least 15 years. Yearly screening should be stopped for people who develop a health problem that would prevent them from  having lung cancer treatment.  If you choose to drink alcohol, do not have more than 2 drinks per day. One drink is considered to be 12 oz (360 mL) of beer, 5 oz (150 mL) of wine, or 1.5 oz (45 mL) of liquor.  Avoid use of street drugs. Do not share needles with anyone. Ask for help if you need support or instructions about stopping the use of drugs.  High blood pressure causes heart disease and increases  the risk of stroke. Blood pressure should be checked at least every 1 2 years. Ongoing high blood pressure should be treated with medicines if weight loss and exercise are not effective.  If you are 19 53 years old, ask your health care provider if you should take aspirin to prevent heart disease.  Diabetes screening involves taking a blood sample to check your fasting blood sugar level. This should be done once every 3 years after age 62, if you are at a normal weight and without risk factors for diabetes. Testing should be considered at a younger age or be carried out more frequently if you are overweight and have at least 1 risk factor for diabetes.  Colorectal cancer can be detected and often prevented. Most routine colorectal cancer screening begins at the age of 40 and continues through age 68. However, your health care provider may recommend screening at an earlier age if you have risk factors for colon cancer. On a yearly basis, your health care provider may provide home test kits to check for hidden blood in the stool. A small camera at the end of a tube may be used to directly examine the colon (sigmoidoscopy or colonoscopy) to detect the earliest forms of colorectal cancer. Talk to your health care provider about this at age 32, when routine screening begins. A direct exam of the colon should be repeated every 5 10 years through age 57, unless early forms of pre-cancerous polyps or small growths are found.  People who are at an increased risk for hepatitis B should be screened for this virus. You are considered at high risk for hepatitis B if:  You were born in a country where hepatitis B occurs often. Talk with your health care provider about which countries are considered high-risk.  Your parents were born in a high-risk country and you have not received a shot to protect against hepatitis B (hepatitis B vaccine).  You have HIV or AIDS.  You use needles to inject street drugs.  You  live with, or have sex with, someone who has hepatitis B.  You are a man who has sex with other men (MSM).  You get hemodialysis treatment.  You take certain medicines for conditions like cancer, organ transplantation, and autoimmune conditions.  Hepatitis C blood testing is recommended for all people born from [birth year] through 1965 and any individual with known risk factors for hepatitis C.  Healthy men should no longer receive prostate-specific antigen (PSA) blood tests as part of routine cancer screening. Talk to your health care provider about prostate cancer screening.  Testicular cancer screening is not recommended for adolescents or adult males who have no symptoms. Screening includes self-exam, a health care provider exam, and other screening tests. Consult with your health care provider about any symptoms you have or any concerns you have about testicular cancer.  Practice safe sex. Use condoms and avoid high-risk sexual practices to reduce the spread of sexually transmitted infections (STIs).  Use sunscreen. Apply sunscreen liberally and repeatedly  throughout the day. You should seek shade when your shadow is shorter than you. Protect yourself by wearing long sleeves, pants, a wide-brimmed hat, and sunglasses year round, whenever you are outdoors.  Tell your health care provider of new moles or changes in moles, especially if there is a change in shape or color. Also tell your provider if a mole is larger than the size of a pencil eraser.  A one-time screening for abdominal aortic aneurysm (AAA) and surgical repair of large AAAs by ultrasound is recommended for men aged 45 75 years who are current or former smokers.  Stay current with your vaccines (immunizations). Document Released: 04/15/2008 Document Revised: 08/08/2013 Document Reviewed: 03/15/2011 [facility] Patient Information 2014 [place], [place].

## 2014-01-29 NOTE — Assessment & Plan Note (Signed)
Started atorva 04/2012 due to poor control and CVA hx -  Improved at last check Recheck lipids and LFTs now with CPX, titrate as needed The current medical regimen is effective;  continue present plan and medications.

## 2014-01-29 NOTE — Assessment & Plan Note (Signed)
Prior Graves hyperthyroid Incidental dx on labs 04/2011 -  on MMZ until 10/2011 - then I-131 01/2012 ablation On replacement thyroid since late summer 2013 Check TSH and adjust as needed  Lab Results  Component Value Date   TSH 2.03 01/26/2013

## 2014-01-30 ENCOUNTER — Encounter: Payer: Self-pay | Admitting: Internal Medicine

## 2014-01-30 ENCOUNTER — Telehealth: Payer: Self-pay | Admitting: Internal Medicine

## 2014-01-30 NOTE — Telephone Encounter (Signed)
Relevant patient education mailed to patient.  

## 2014-01-30 NOTE — Addendum Note (Signed)
Addended by: Gwendolyn Grant A on: 01/30/2014 08:46 AM   Modules accepted: Orders

## 2014-02-07 ENCOUNTER — Ambulatory Visit
Admission: RE | Admit: 2014-02-07 | Discharge: 2014-02-07 | Disposition: A | Discharge status: Home / Self Care | Payer: BC Managed Care – PPO | Source: Ambulatory Visit | Attending: Internal Medicine | Admitting: Internal Medicine

## 2014-02-07 DIAGNOSIS — R7989 Other specified abnormal findings of blood chemistry: Secondary | ICD-10-CM

## 2014-02-07 DIAGNOSIS — R160 Hepatomegaly, not elsewhere classified: Secondary | ICD-10-CM

## 2014-02-07 DIAGNOSIS — R945 Abnormal results of liver function studies: Principal | ICD-10-CM

## 2014-02-22 ENCOUNTER — Encounter: Payer: Self-pay | Admitting: Internal Medicine

## 2014-03-14 ENCOUNTER — Ambulatory Visit (AMBULATORY_SURGERY_CENTER): Payer: Self-pay

## 2014-03-14 VITALS — Ht 71.0 in | Wt 203.0 lb

## 2014-03-14 DIAGNOSIS — Z800 Family history of malignant neoplasm of digestive organs: Secondary | ICD-10-CM

## 2014-03-14 MED ORDER — SUPREP BOWEL PREP KIT 17.5-3.13-1.6 GM/177ML PO SOLN: 1.0000 | Freq: Once | ORAL | Status: DC

## 2014-03-14 NOTE — Progress Notes (Signed)
No allergies to eggs or soy No home oxygen No diet/weight loss meds No past problems with anesthesia  No email 

## 2014-03-18 ENCOUNTER — Encounter: Payer: Self-pay | Admitting: Gastroenterology

## 2014-03-28 ENCOUNTER — Encounter: Payer: Self-pay | Admitting: Gastroenterology

## 2014-03-28 ENCOUNTER — Ambulatory Visit (AMBULATORY_SURGERY_CENTER): Payer: BC Managed Care – PPO | Admitting: Gastroenterology

## 2014-03-28 VITALS — BP 102/60 | HR 88 | Temp 99.0°F | Resp 41 | Ht 71.0 in | Wt 203.0 lb

## 2014-03-28 DIAGNOSIS — Z1211 Encounter for screening for malignant neoplasm of colon: Secondary | ICD-10-CM

## 2014-03-28 DIAGNOSIS — Z800 Family history of malignant neoplasm of digestive organs: Secondary | ICD-10-CM

## 2014-03-28 DIAGNOSIS — D126 Benign neoplasm of colon, unspecified: Secondary | ICD-10-CM

## 2014-03-28 DIAGNOSIS — K648 Other hemorrhoids: Secondary | ICD-10-CM

## 2014-03-28 MED ORDER — SODIUM CHLORIDE 0.9 % IV SOLN: 500.0000 mL | INTRAVENOUS | Status: DC

## 2014-03-28 NOTE — Patient Instructions (Addendum)
YOU HAD AN ENDOSCOPIC PROCEDURE TODAY AT THE [facility]: Refer to the procedure report that was given to you for any specific questions about what was found during the examination.  If the procedure report does not answer your questions, please call your gastroenterologist to clarify.  If you requested that your care partner not be given the details of your procedure findings, then the procedure report has been included in a sealed envelope for you to review at your convenience later.  YOU SHOULD EXPECT: Some feelings of bloating in the abdomen. Passage of more gas than usual.  Walking can help get rid of the air that was put into your GI tract during the procedure and reduce the bloating. If you had a lower endoscopy (such as a colonoscopy or flexible sigmoidoscopy) you may notice spotting of blood in your stool or on the toilet paper. If you underwent a bowel prep for your procedure, then you may not have a normal bowel movement for a few days.  DIET: Your first meal following the procedure should be a light meal and then it is ok to progress to your normal diet.  A half-sandwich or bowl of soup is an example of a good first meal.  Heavy or fried foods are harder to digest and may make you feel nauseous or bloated.  Likewise meals heavy in dairy and vegetables can cause extra gas to form and this can also increase the bloating.  Drink plenty of fluids but you should avoid alcoholic beverages for 24 hours.  ACTIVITY: Your care partner should take you home directly after the procedure.  You should plan to take it easy, moving slowly for the rest of the day.  You can resume normal activity the day after the procedure however you should NOT DRIVE or use heavy machinery for 24 hours (because of the sedation medicines used during the test).    SYMPTOMS TO REPORT IMMEDIATELY: A gastroenterologist can be reached at any hour.  During normal business hours, 8:30 AM to 5:00 PM Monday through Friday,  call [phone].  After hours and on weekends, please call the GI answering service at [phone] who will take a message and have the physician on call contact you.   Following lower endoscopy (colonoscopy or flexible sigmoidoscopy):  Excessive amounts of blood in the stool  Significant tenderness or worsening of abdominal pains  Swelling of the abdomen that is new, acute  Fever of 100F or higher  FOLLOW UP: If any biopsies were taken you will be contacted by phone or by letter within the next 1-3 weeks.  Call your gastroenterologist if you have not heard about the biopsies in 3 weeks.  Our staff will call the home number listed on your records the next business day following your procedure to check on you and address any questions or concerns that you may have at that time regarding the information given to you following your procedure. This is a courtesy call and so if there is no answer at the home number and we have not heard from you through the emergency physician on call, we will assume that you have returned to your regular daily activities without incident.  SIGNATURES/CONFIDENTIALITY: You and/or your care partner have signed paperwork which will be entered into your electronic medical record.  These signatures attest to the fact that that the information above on your After Visit Summary has been reviewed and is understood.  Full responsibility of the confidentiality of this   discharge information lies with you and/or your care-partner.  No NSAIDS or ASA for 2 weeks- including Aspirin, Aspirin containing products, NSAIDS (Ibuprofen, Advil, Aleve, Motrin)  Await pathology results  Please read over handouts about polyps, internal hemorrhoids, and high fiber diets  Repeat colonoscopy in 1 year

## 2014-03-28 NOTE — Progress Notes (Signed)
Called to room to assist during endoscopic procedure.  Patient ID and intended procedure confirmed with present staff. Received instructions for my participation in the procedure from the performing physician.  

## 2014-03-28 NOTE — Op Note (Signed)
[facility]  [address] [place], [id number]   COLONOSCOPY PROCEDURE REPORT  PATIENT: Jon Boyd  MR#: [id number] BIRTHDATE: [birth year] , 78  yrs. old GENDER: Male ENDOSCOPIST: Inda Castle, MD REFERRED BZ:JIRCVEL Asa Lente, M.D. PROCEDURE DATE:  03/28/2014 PROCEDURE:   Colonoscopy with snare polypectomy, Colonoscopy with cold biopsy polypectomy, and Submucosal injection, any substance First Screening Colonoscopy - Avg.  risk and is 50 yrs.  old or older Yes.  Prior Negative Screening - Now for repeat screening. N/A  History of Adenoma - Now for follow-up colonoscopy & has been > or = to 3 yrs.  N/A  Polyps Removed Today? Yes. ASA CLASS:   Class III INDICATIONS:elevated risk screening and Patient's immediate family history of colon cancer. MEDICATIONS: MAC sedation, administered by CRNA and Propofol (Diprivan) 650 mg IV  DESCRIPTION OF PROCEDURE:   After the risks benefits and alternatives of the procedure were thoroughly explained, informed consent was obtained.  A digital rectal exam revealed no abnormalities of the rectum.   The [id number]  endoscope was introduced through the anus and advanced to the cecum, which was identified by both the appendix and ileocecal valve. No adverse events experienced.   The quality of the prep was Suprep good  The instrument was then slowly withdrawn as the colon was fully examined.      COLON FINDINGS: Two sessile polyps measuring 15 and 26mm mm in size were found at the cecum.  Endoscopic mucosal resection was performed by injecting saline into the submucosa to raise the lesion and polypectomy was performed with snare cautery (4cc and 6cc, respectively)  The polyp lifted well after submucosal injection The polyp lifted well after submucosal injection.  The resection was complete and the polyp tissue was completely retrieved.   A sessile polyp measuring 2 mm in size was found at the cecum.  A  polypectomy was performed with cold forceps.   Two sessile polyps measuring 3 mm in size were found in the ascending colon.  A polypectomy was performed with a cold snare.  The resection was complete and the polyp tissue was completely retrieved.   Three sessile polyps measuring 2 mm in size were found at the hepatic flexure.  A polypectomy was performed with cold forceps.   A sessile polyp measuring 4 mm in size was found in the transverse colon.  A polypectomy was performed with a cold snare. The resection was complete and the polyp tissue was completely retrieved.   Six sessile polyps measuring 5-15 mm in size were found in the descending colon.  A polypectomy was performed with a cold snare and using snare cautery.(on the 55mm polyp).  The resection was complete and the polyp tissue was completely retrieved.   A sessile polyp measuring 4 mm in size was found in the distal descending colon.  A polypectomy was performed with a cold snare.  The resection was complete and the polyp tissue was completely retrieved.   Internal hemorrhoids were found. Retroflexed views revealed no abnormalities. The time to cecum=4 minutes 38 seconds.  Withdrawal time=37 minutes 01 seconds.  The scope was withdrawn and the procedure completed. COMPLICATIONS: There were no complications.  ENDOSCOPIC IMPRESSION: 1.   Two sessile polyps measuring 15 and 29mm mm in size were found at the cecum; endoscopic mucosal resection was performed 2.   Sessile polyp measuring 2 mm in size was found at the cecum; polypectomy was performed with cold forceps 3.   Two sessile  polyps measuring 3 mm in size were found in the ascending colon; polypectomy was performed with a cold snare 4.   Three sessile polyps measuring 2 mm in size were found at the hepatic flexure; polypectomy was performed with cold forceps 5.   Sessile polyp measuring 4 mm in size was found in the transverse colon; polypectomy was performed with a cold  snare 6.   Six sessile polyps measuring 5-15 mm in size were found in the descending colon; polypectomy was performed with a cold snare and using snare cautery 7.   Sessile polyp measuring 4 mm in size was found in the distal descending colon; polypectomy was performed with a cold snare 8.   Internal hemorrhoids  RECOMMENDATIONS: Colonoscopy 1 year   eSigned:  Inda Castle, MD 03/28/2014 10:01 AM   cc:   PATIENT NAME:  Jon Boyd, Jon Boyd MR#: [id number]

## 2014-03-29 ENCOUNTER — Telehealth: Payer: Self-pay | Admitting: *Deleted

## 2014-03-29 NOTE — Telephone Encounter (Signed)
  Follow up Call-  Call back number 03/28/2014  Post procedure Call Back phone  # [phone]  Permission to leave phone message Yes     Patient questions:  Do you have a fever, pain , or abdominal swelling? no Pain Score  0 *  Have you tolerated food without any problems? yes  Have you been able to return to your normal activities? yes  Do you have any questions about your discharge instructions: Diet   no Medications  no Follow up visit  no  Do you have questions or concerns about your Care? no  Actions: * If pain score is 4 or above: No action needed, pain <4.

## 2014-04-02 ENCOUNTER — Encounter: Payer: Self-pay | Admitting: Gastroenterology

## 2014-06-01 ENCOUNTER — Encounter: Payer: Self-pay | Admitting: Family Medicine

## 2014-06-01 ENCOUNTER — Ambulatory Visit (INDEPENDENT_AMBULATORY_CARE_PROVIDER_SITE_OTHER): Payer: BC Managed Care – PPO | Admitting: Family Medicine

## 2014-06-01 VITALS — BP 112/74 | HR 71 | Temp 97.9°F | Resp 16 | Wt 204.8 lb

## 2014-06-01 DIAGNOSIS — G609 Hereditary and idiopathic neuropathy, unspecified: Secondary | ICD-10-CM

## 2014-06-01 DIAGNOSIS — M79605 Pain in left leg: Secondary | ICD-10-CM

## 2014-06-01 DIAGNOSIS — M79609 Pain in unspecified limb: Secondary | ICD-10-CM

## 2014-06-01 DIAGNOSIS — F17200 Nicotine dependence, unspecified, uncomplicated: Secondary | ICD-10-CM

## 2014-06-01 DIAGNOSIS — G629 Polyneuropathy, unspecified: Secondary | ICD-10-CM

## 2014-06-01 DIAGNOSIS — I10 Essential (primary) hypertension: Secondary | ICD-10-CM

## 2014-06-01 DIAGNOSIS — R609 Edema, unspecified: Secondary | ICD-10-CM

## 2014-06-01 MED ORDER — FOLIC ACID 1 MG PO TABS: 1.0000 mg | ORAL_TABLET | Freq: Every day | ORAL | Status: DC

## 2014-06-01 MED ORDER — VITAMIN B-1 100 MG PO TABS: 100.0000 mg | ORAL_TABLET | Freq: Every day | ORAL | Status: DC

## 2014-06-01 NOTE — Progress Notes (Signed)
Pre visit review using our clinic review tool, if applicable. No additional management support is needed unless otherwise documented below in the visit note. 

## 2014-06-01 NOTE — Patient Instructions (Addendum)
Start Thiamine, Folic Acid daily Minimize Alcohol and Tobacco Call if worse    Deep Vein Thrombosis? A deep vein thrombosis (DVT) is a blood clot that develops in the deep, larger veins of the leg, arm, or pelvis. These are more dangerous than clots that might form in veins near the surface of the body. A DVT can lead to serious and even life-threatening complications if the clot breaks off and travels in the bloodstream to the lungs.  A DVT can damage the valves in your leg veins so that instead of flowing upward, the blood pools in the lower leg. This is called post-thrombotic syndrome, and it can result in pain, swelling, discoloration, and sores on the leg. CAUSES Usually, several things contribute to the formation of blood clots. Contributing factors include:  The flow of blood slows down.  The inside of the vein is damaged in some way.  You have a condition that makes blood clot more easily. RISK FACTORS Some people are more likely than others to develop blood clots. Risk factors include:   Smoking.  Being overweight (obese).  Sitting or lying still for a long time. This includes long-distance travel, paralysis, or recovery from an illness or surgery. Other factors that increase risk are:   Older age, especially over 61 years of age.  Having a family history of blood clots or if you have already had a blot clot.  Having major or lengthy surgery. This is especially true for surgery on the hip, knee, or belly (abdomen). Hip surgery is particularly high risk.  Having a long, thin tube (catheter) placed inside a vein during a medical procedure.  Breaking a hip or leg.  Having cancer or cancer treatment.  Pregnancy and childbirth.  Hormone changes make the blood clot more easily during pregnancy.  The fetus puts pressure on the veins of the pelvis.  There is a risk of injury to veins during delivery or a caesarean delivery. The risk is highest just after  childbirth.  Medicines containing the male hormone estrogen. This includes birth control pills and hormone replacement therapy.  Other circulation or heart problems.  SIGNS AND SYMPTOMS When a clot forms, it can either partially or totally block the blood flow in that vein. Symptoms of a DVT can include:  Swelling of the leg or arm, especially if one side is much worse.  Warmth and redness of the leg or arm, especially if one side is much worse.  Pain in an arm or leg. If the clot is in the leg, symptoms may be more noticeable or worse when standing or walking. The symptoms of a DVT that has traveled to the lungs (pulmonary embolism, PE) usually start suddenly and include:  Shortness of breath.  Coughing.  Coughing up blood or blood-tinged mucus.  Chest pain. The chest pain is often worse with deep breaths.  Rapid heartbeat. Anyone with these symptoms should get emergency medical treatment right away. Do not wait to see if the symptoms will go away. Call your local emergency services (911 in the [place] if you have these symptoms. Do not drive yourself to the hospital. DIAGNOSIS If a DVT is suspected, your health care provider will take a full medical history and perform a physical exam. Tests that also may be required include:  Blood tests, including studies of the clotting properties of the blood.  Ultrasound to see if you have clots in your legs or lungs.  X-rays to show the flow of blood when dye  is injected into the veins (venogram).  Studies of your lungs if you have any chest symptoms. PREVENTION  Exercise the legs regularly. Take a brisk 30-minute walk every day.  Maintain a weight that is appropriate for your height.  Avoid sitting or lying in bed for long periods of time without moving your legs.  Women, particularly those over the age of 52 years, should consider the risks and benefits of taking estrogen medicines, including birth control pills.  Do not smoke,  especially if you take estrogen medicines.  Long-distance travel can increase your risk of DVT. You should exercise your legs by walking or pumping the muscles every hour.  Many of the risk factors above relate to situations that exist with hospitalization, either for illness, injury, or elective surgery. Prevention may include medical and nonmedical measures.  Your health care provider will assess you for the need for venous thromboembolism prevention when you are admitted to the hospital. If you are having surgery, your surgeon will assess you the day of or day after surgery. TREATMENT Once identified, a DVT can be treated. It can also be prevented in some circumstances. Once you have had a DVT, you may be at increased risk for a DVT in the future. The most common treatment for DVT is blood-thinning (anticoagulant) medicine, which reduces the blood's tendency to clot. Anticoagulants can stop new blood clots from forming and stop old clots from growing. They cannot dissolve existing clots. Your body does this by itself over time. Anticoagulants can be given by mouth, through an IV tube, or by injection. Your health care provider will determine the best program for you. Other medicines or treatments that may be used are:  Heparin or related medicines (low molecular weight heparin) are often the first treatment for a blood clot. They act quickly. However, they cannot be taken orally and must be given either in shot form or by IV tube.  Heparin can cause a fall in a component of blood that stops bleeding and forms blood clots (platelets). You will be monitored with blood tests to be sure this does not occur.  Warfarin is an anticoagulant that can be swallowed. It takes a few days to start working, so usually heparin or related medicines are used in combination. Once warfarin is working, heparin is usually stopped.  Factor Xa inhibitor medicines, such as rivaroxaban and apixaban, also reduce blood  clotting. These medicines are taken orally and can often be used without heparin or related medicines.  Less commonly, clot dissolving drugs (thrombolytics) are used to dissolve a DVT. They carry a high risk of bleeding, so they are used mainly in severe cases where your life or a part of your body is threatened.  Very rarely, a blood clot in the leg needs to be removed surgically.  If you are unable to take anticoagulants, your health care provider may arrange for you to have a filter placed in a main vein in your abdomen. This filter prevents clots from traveling to your lungs. HOME CARE INSTRUCTIONS  Take all medicines as directed by your health care provider.  Learn as much as you can about DVT.  Wear a medical alert bracelet or carry a medical alert card.  Ask your health care provider how soon you can go back to normal activities. It is important to stay active to prevent blood clots. If you are on anticoagulant medicine, avoid contact sports.  It is very important to exercise. This is especially important while traveling,  sitting, or standing for long periods of time. Exercise your legs by walking or by tightening and relaxing your leg muscles regularly. Take frequent walks.  You may need to wear compression stockings. These are tight elastic stockings that apply pressure to the lower legs. This pressure can help keep the blood in the legs from clotting. Taking Warfarin Warfarin is a daily medicine that is taken by mouth. Your health care provider will advise you on the length of treatment (usually 3-6 months, sometimes lifelong). If you take warfarin:  Understand how to take warfarin and foods that can affect how warfarin works in Veterinary surgeon.  Too much and too little warfarin are both dangerous. Too much warfarin increases the risk of bleeding. Too little warfarin continues to allow the risk for blood clots. Warfarin and Regular Blood Testing While taking warfarin, you will need to  have regular blood tests to measure your blood clotting time. These blood tests usually include both the prothrombin time (PT) and international normalized ratio (INR) tests. The PT and INR results allow your health care provider to adjust your dose of warfarin. It is very important that you have your PT and INR tested as often as directed by your health care provider.  Warfarin and Your Diet Avoid major changes in your diet, or notify your health care provider before changing your diet. Arrange a visit with a registered dietitian to answer your questions. Many foods, especially foods high in vitamin K, can interfere with warfarin and affect the PT and INR results. You should eat a consistent amount of foods high in vitamin K. Foods high in vitamin K include:   Spinach, kale, broccoli, cabbage, collard and turnip greens, Brussels sprouts, peas, cauliflower, seaweed, and parsley.  Beef and pork liver.  Green tea.  Soybean oil. Warfarin with Other Medicines Many medicines can interfere with warfarin and affect the PT and INR results. You must:  Tell your health care provider about any and all medicines, vitamins, and supplements you take, including aspirin and other over-the-counter anti-inflammatory medicines. Be especially cautious with aspirin and anti-inflammatory medicines. Ask your health care provider before taking these.  Do not take or discontinue any prescribed or over-the-counter medicine except on the advice of your health care provider or pharmacist. Warfarin Side Effects Warfarin can have side effects, such as easy bruising and difficulty stopping bleeding. Ask your health care provider or pharmacist about other side effects of warfarin. You will need to:  Hold pressure over cuts for longer than usual.  Notify your dentist and other health care providers that you are taking warfarin before you undergo any procedures where bleeding may occur. Warfarin with Alcohol and Tobacco    Drinking alcohol frequently can increase the effect of warfarin, leading to excess bleeding. It is best to avoid alcoholic drinks or to consume only very small amounts while taking warfarin. Notify your health care provider if you change your alcohol intake.   Do not use any tobacco products including cigarettes, chewing tobacco, or electronic cigarettes. If you smoke, quit. Ask your health care provider for help with quitting smoking. Alternative Medicines to Warfarin: Factor Xa Inhibitor Medicines  These blood-thinning medicines are taken by mouth, usually for several weeks or longer. It is important to take the medicine every single day at the same time each day.  There are no regular blood tests required when using these medicines.  There are fewer food and drug interactions than with warfarin.  The side effects of this class  of medicine are similar to those of warfarin, including excessive bruising or bleeding. Ask your health care provider or pharmacist about other potential side effects. SEEK MEDICAL CARE IF:  You notice a rapid heartbeat.  You feel weaker or more tired than usual.  You feel faint.  You notice increased bruising.  You feel your symptoms are not getting better in the time expected.  You believe you are having side effects of medicine. SEEK IMMEDIATE MEDICAL CARE IF:  You have chest pain.  You have trouble breathing.  You have new or increased swelling or pain in one leg.  You cough up blood.  You notice blood in vomit, in a bowel movement, or in urine. MAKE SURE YOU:  Understand these instructions.  Will watch your condition.  Will get help right away if you are not doing well or get worse. Document Released: 10/18/2005 Document Revised: 03/04/2014 Document Reviewed: 06/25/2013 [facility] Patient Information 2015 [place], [place]. This information is not intended to replace advice given to you by your health care provider. Make sure you discuss any  questions you have with your health care provider. Peripheral Neuropathy Peripheral neuropathy is a type of nerve damage. It affects nerves that carry signals between the spinal cord and other parts of the body. These are called peripheral nerves. With peripheral neuropathy, one nerve or a group of nerves may be damaged.  CAUSES  Many things can damage peripheral nerves. For some people with peripheral neuropathy, the cause is unknown. Some causes include:  Diabetes. This is the most common cause of peripheral neuropathy.  Injury to a nerve.  Pressure or stress on a nerve that lasts a long time.  Too little vitamin B. Alcoholism can lead to this.  Infections.  Autoimmune diseases, such as multiple sclerosis and systemic lupus erythematosus.  Inherited nerve diseases.  Some medicines, such as cancer drugs.  Toxic substances, such as lead and mercury.  Too little blood flowing to the legs.  Kidney disease.  Thyroid disease. SIGNS AND SYMPTOMS  Different people have different symptoms. The symptoms you have will depend on which of your nerves is damaged. Common symptoms include:  Loss of feeling (numbness) in the feet and hands.  Tingling in the feet and hands.  Pain that burns.  Very sensitive skin.  Weakness.  Not being able to move a part of the body (paralysis).  Muscle twitching.  Clumsiness or poor coordination.  Loss of balance.  Not being able to control your bladder.  Feeling dizzy.  Sexual problems. DIAGNOSIS  Peripheral neuropathy is a symptom, not a disease. Finding the cause of peripheral neuropathy can be hard. To figure that out, your health care provider will take a medical history and do a physical exam. A neurological exam will also be done. This involves checking things affected by your brain, spinal cord, and nerves (nervous system). For example, your health care provider will check your reflexes, how you move, and what you can feel.  Other  types of tests may also be ordered, such as:  Blood tests.  A test of the fluid in your spinal cord.  Imaging tests, such as CT scans or an MRI.  Electromyography (EMG). This test checks the nerves that control muscles.  Nerve conduction velocity tests. These tests check how fast messages pass through your nerves.  Nerve biopsy. A small piece of nerve is removed. It is then checked under a microscope. TREATMENT   Medicine is often used to treat peripheral neuropathy. Medicines may  include:  Pain-relieving medicines. Prescription or over-the-counter medicine may be suggested.  Antiseizure medicine. This may be used for pain.  Antidepressants. These also may help ease pain from neuropathy.  Lidocaine. This is a numbing medicine. You might wear a patch or be given a shot.  Mexiletine. This medicine is typically used to help control irregular heart rhythms.  Surgery. Surgery may be needed to relieve pressure on a nerve or to destroy a nerve that is causing pain.  Physical therapy to help movement.  Assistive devices to help movement. HOME CARE INSTRUCTIONS   Only take over-the-counter or prescription medicines as directed by your health care provider. Follow the instructions carefully for any given medicines. Do not take any other medicines without first getting approval from your health care provider.  If you have diabetes, work closely with your health care provider to keep your blood sugar under control.  If you have numbness in your feet:  Check every day for signs of injury or infection. Watch for redness, warmth, and swelling.  Wear padded socks and comfortable shoes. These help protect your feet.  Do not do things that put pressure on your damaged nerve.  Do not smoke. Smoking keeps blood from getting to damaged nerves.  Avoid or limit alcohol. Too much alcohol can cause a lack of B vitamins. These vitamins are needed for healthy nerves.  Develop a good support  system. Coping with peripheral neuropathy can be stressful. Talk to a mental health specialist or join a support group if you are struggling.  Follow up with your health care provider as directed. SEEK MEDICAL CARE IF:   You have new signs or symptoms of peripheral neuropathy.  You are struggling emotionally from dealing with peripheral neuropathy.  You have a fever. SEEK IMMEDIATE MEDICAL CARE IF:   You have an injury or infection that is not healing.  You feel very dizzy or begin vomiting.  You have chest pain.  You have trouble breathing. Document Released: 10/08/2002 Document Revised: 06/30/2011 Document Reviewed: 06/25/2013 [facility] Patient Information 2015 [place], [place]. This information is not intended to replace advice given to you by your health care provider. Make sure you discuss any questions you have with your health care provider.

## 2014-06-03 ENCOUNTER — Encounter: Payer: Self-pay | Admitting: Family Medicine

## 2014-06-03 DIAGNOSIS — G629 Polyneuropathy, unspecified: Secondary | ICD-10-CM | POA: Insufficient documentation

## 2014-06-03 DIAGNOSIS — R609 Edema, unspecified: Secondary | ICD-10-CM

## 2014-06-03 HISTORY — DX: Edema, unspecified: R60.9

## 2014-06-03 NOTE — Assessment & Plan Note (Signed)
Likely related to excessive alcohol intake. He is encouraged to cut down and/or quit alcohol altogether but no matter what he is to take thiamine and folic acid daily. He agrees and prescriptions are sent in.

## 2014-06-03 NOTE — Assessment & Plan Note (Signed)
B/l but L>R with some left sided calf pain. He agrees to go for ultrasound to rule out DVT but then never shows up for the imaging. He is aware of the risks of not treating ie PE and he is aware he has numerous risk factors including smoking

## 2014-06-03 NOTE — Assessment & Plan Note (Signed)
Well controlled, no changes to meds. Encouraged heart healthy diet such as the DASH diet and exercise as tolerated.  °

## 2014-06-03 NOTE — Progress Notes (Signed)
Patient ID: Jon Boyd, male   DOB: 07/20/1961, 53 y.o.   MRN: [id number] Jon Boyd [id number] Dec 27, 1960 06/03/2014      Progress Note-Follow Up  Subjective  Chief Complaint  Chief Complaint  Patient presents with  . Numbness    bilateral lower extremities, knees down. started at 3:30am    HPI  Patient is a 53 year old male in today for evaluation of lower extremity pain and numbness. Symptoms developed this am per patient. He woke up with paresthesias in his lower legs b/l but now just describes some mild tingling. He denies any trauma but is also c/o some edema in b/l lower extremities worse on left than right. No recent changes in diet or activity but does acknowledge drinking significant amounts of alcohol daily. Denies CP/palp/SOB/HA/congestion/fevers/GI or GU c/o. Taking meds as prescribed  Past Medical History  Diagnosis Date  . Hypertension   . Chronic headaches   . Hyperlipidemia   . Stroke   . Grave's disease 04/2011    s/p I-131 tx 01/2012  . Postablative hypothyroidism   . Edema 06/03/2014    Past Surgical History  Procedure Laterality Date  . Back surgery    . Ankle surgery    . Finger surgery  09/2012    gramig    Family History  Problem Relation Age of Onset  . COPD Mother   . Hyperthyroidism Sister   . Colon cancer Father 93    History   Social History  . Marital Status: Single    Spouse Name: N/A    Number of Children: N/A  . Years of Education: N/A   Occupational History  . Not on file.   Social History Main Topics  . Smoking status: Current Every Day Smoker -- 1.00 packs/day for 35 years    Types: Cigarettes  . Smokeless tobacco: Never Used  . Alcohol Use: 8.4 oz/week    14 Cans of beer per week  . Drug Use: No  . Sexual Activity: Not on file   Other Topics Concern  . Not on file   Social History Narrative  . No narrative on file    Current Outpatient Prescriptions on File Prior to Visit  Medication Sig Dispense Refill  .  ALPRAZolam (XANAX) 0.5 MG tablet Take 1 tablet (0.5 mg total) by mouth 2 (two) times daily as needed for anxiety.  40 tablet  1  . aspirin 81 MG chewable tablet Chew 81 mg by mouth daily.        Marland Kitchen atorvastatin (LIPITOR) 20 MG tablet TAKE 1 TABLET EVERY DAY  90 tablet  1  . levothyroxine (SYNTHROID, LEVOTHROID) 200 MCG tablet Take 1 tablet (200 mcg total) by mouth daily.  90 tablet  1  . losartan (COZAAR) 50 MG tablet TAKE 1 TABLET BY MOUTH EVERY DAY  90 tablet  1  . metoprolol succinate (TOPROL-XL) 25 MG 24 hr tablet Take 1 tablet (25 mg total) by mouth daily.  90 tablet  1   No current facility-administered medications on file prior to visit.    No Known Allergies  Review of Systems  Review of Systems  Constitutional: Negative for fever and malaise/fatigue.  HENT: Negative for congestion.   Eyes: Negative for discharge.  Respiratory: Negative for shortness of breath.   Cardiovascular: Positive for leg swelling. Negative for chest pain and palpitations.  Gastrointestinal: Negative for nausea, abdominal pain and diarrhea.  Genitourinary: Negative for dysuria.  Musculoskeletal: Positive for myalgias. Negative for falls.  Left calf pain  Skin: Negative for rash.  Neurological: Positive for sensory change. Negative for loss of consciousness and headaches.  Endo/Heme/Allergies: Negative for polydipsia.  Psychiatric/Behavioral: Negative for depression and suicidal ideas. The patient is not nervous/anxious and does not have insomnia.     Objective  BP 112/74  Pulse 71  Temp(Src) 97.9 F (36.6 C) (Oral)  Resp 16  Wt 204 lb 12 oz (92.874 kg)  SpO2 97%  Physical Exam  Physical Exam  Constitutional: He is oriented to person, place, and time and well-developed, well-nourished, and in no distress. No distress.  HENT:  Head: Normocephalic and atraumatic.  Eyes: Conjunctivae are normal.  Neck: Neck supple. No thyromegaly present.  Cardiovascular: Normal rate, regular rhythm and  normal heart sounds.   No murmur heard. Pulmonary/Chest: Effort normal and breath sounds normal. No respiratory distress.  Abdominal: He exhibits no distension and no mass. There is no tenderness.  Musculoskeletal: He exhibits no edema.  Neurological: He is alert and oriented to person, place, and time.  Skin: Skin is warm.  Psychiatric: Memory, affect and judgment normal.    Lab Results  Component Value Date   TSH 1.46 01/29/2014   Lab Results  Component Value Date   WBC 7.4 01/29/2014   HGB 13.2 01/29/2014   HCT 38.7* 01/29/2014   MCV 102.6* 01/29/2014   PLT 144.0* 01/29/2014   Lab Results  Component Value Date   CREATININE 0.9 01/29/2014   BUN 14 01/29/2014   NA 139 01/29/2014   K 3.9 01/29/2014   CL 106 01/29/2014   CO2 23 01/29/2014   Lab Results  Component Value Date   ALT 60* 01/29/2014   AST 117* 01/29/2014   ALKPHOS 232* 01/29/2014   BILITOT 1.0 01/29/2014   Lab Results  Component Value Date   CHOL 148 01/29/2014   Lab Results  Component Value Date   HDL 52.90 01/29/2014   Lab Results  Component Value Date   LDLCALC 55 01/29/2014   Lab Results  Component Value Date   TRIG 200.0* 01/29/2014   Lab Results  Component Value Date   CHOLHDL 3 01/29/2014     Assessment & Plan  Hypertension Well controlled, no changes to meds. Encouraged heart healthy diet such as the DASH diet and exercise as tolerated.   Multifactorial peripheral neuropathy Likely related to excessive alcohol intake. He is encouraged to cut down and/or quit alcohol altogether but no matter what he is to take thiamine and folic acid daily. He agrees and prescriptions are sent in.   Edema B/l but L>R with some left sided calf pain. He agrees to go for ultrasound to rule out DVT but then never shows up for the imaging. He is aware of the risks of not treating ie PE and he is aware he has numerous risk factors including smoking  Smoker Encouraged complete cessation. Discussed need to quit as relates  to risk of numerous cancers, cardiac and pulmonary disease as well as neurologic complications. Counseled for greater than 3 minutes

## 2014-06-03 NOTE — Assessment & Plan Note (Signed)
Encouraged complete cessation. Discussed need to quit as relates to risk of numerous cancers, cardiac and pulmonary disease as well as neurologic complications. Counseled for greater than 3 minutes 

## 2014-06-05 ENCOUNTER — Other Ambulatory Visit: Payer: Self-pay | Admitting: Internal Medicine

## 2014-06-08 ENCOUNTER — Other Ambulatory Visit: Payer: Self-pay | Admitting: Internal Medicine

## 2014-06-10 NOTE — Telephone Encounter (Signed)
Rx request faxed to pharmacy/[facility]  

## 2014-06-25 ENCOUNTER — Other Ambulatory Visit (HOSPITAL_COMMUNITY): Payer: Self-pay | Admitting: Family Medicine

## 2014-06-25 DIAGNOSIS — M25562 Pain in left knee: Secondary | ICD-10-CM

## 2014-06-26 ENCOUNTER — Encounter (HOSPITAL_COMMUNITY): Payer: BC Managed Care – PPO

## 2014-06-29 ENCOUNTER — Other Ambulatory Visit: Payer: Self-pay | Admitting: Internal Medicine

## 2014-07-01 NOTE — Telephone Encounter (Signed)
OK X1  This is high risk , controlled substance as per Dr Beer' s list.It must be filled locally &  monthly only. They can discuss these guidelines with their Pharmacist. Every time it is filled ; the computer issues warning as to risk in elderly.It is meant to be taken as little as possible, as needed only because of these risks   

## 2014-07-05 ENCOUNTER — Other Ambulatory Visit: Payer: Self-pay | Admitting: Internal Medicine

## 2014-07-05 NOTE — Telephone Encounter (Signed)
Dr. Dutch Quint is pt's PCP not Dr. Charlett Blake.

## 2014-07-29 ENCOUNTER — Other Ambulatory Visit: Payer: Self-pay

## 2014-07-29 MED ORDER — LOSARTAN POTASSIUM 50 MG PO TABS: ORAL_TABLET | ORAL | Status: DC

## 2014-07-29 MED ORDER — LEVOTHYROXINE SODIUM 200 MCG PO TABS: 200.0000 ug | ORAL_TABLET | Freq: Every day | ORAL | Status: DC

## 2014-07-29 MED ORDER — ATORVASTATIN CALCIUM 20 MG PO TABS: ORAL_TABLET | ORAL | Status: DC

## 2014-08-02 ENCOUNTER — Ambulatory Visit: Payer: BC Managed Care – PPO | Admitting: Family Medicine

## 2014-08-31 ENCOUNTER — Other Ambulatory Visit: Payer: Self-pay | Admitting: Family Medicine

## 2014-09-02 NOTE — Telephone Encounter (Signed)
Pt is under Dr Asa Lente

## 2014-10-02 ENCOUNTER — Encounter: Payer: Self-pay | Admitting: Internal Medicine

## 2014-10-02 ENCOUNTER — Ambulatory Visit (INDEPENDENT_AMBULATORY_CARE_PROVIDER_SITE_OTHER): Payer: BC Managed Care – PPO | Admitting: Internal Medicine

## 2014-10-02 ENCOUNTER — Other Ambulatory Visit (INDEPENDENT_AMBULATORY_CARE_PROVIDER_SITE_OTHER): Payer: BC Managed Care – PPO

## 2014-10-02 VITALS — BP 130/68 | HR 79 | Temp 98.3°F | Ht 71.0 in | Wt 199.8 lb

## 2014-10-02 DIAGNOSIS — E038 Other specified hypothyroidism: Secondary | ICD-10-CM

## 2014-10-02 DIAGNOSIS — E785 Hyperlipidemia, unspecified: Secondary | ICD-10-CM

## 2014-10-02 DIAGNOSIS — Z720 Tobacco use: Secondary | ICD-10-CM

## 2014-10-02 DIAGNOSIS — R160 Hepatomegaly, not elsewhere classified: Secondary | ICD-10-CM

## 2014-10-02 DIAGNOSIS — I10 Essential (primary) hypertension: Secondary | ICD-10-CM

## 2014-10-02 DIAGNOSIS — N529 Male erectile dysfunction, unspecified: Secondary | ICD-10-CM

## 2014-10-02 DIAGNOSIS — F17200 Nicotine dependence, unspecified, uncomplicated: Secondary | ICD-10-CM

## 2014-10-02 LAB — LIPID PANEL
CHOLESTEROL: 131 mg/dL (ref 0–200)
HDL: 37.1 mg/dL — ABNORMAL LOW (ref >39.00)
LDL CALC: 78 mg/dL (ref 0–99)
NonHDL: 93.9
TRIGLYCERIDES: 81 mg/dL (ref 0.0–149.0)
Total CHOL/HDL Ratio: 4
VLDL: 16.2 mg/dL (ref 0.0–40.0)

## 2014-10-02 LAB — BASIC METABOLIC PANEL
BUN: 17 mg/dL (ref 6–23)
CALCIUM: 8.7 mg/dL (ref 8.4–10.5)
CO2: 23 mEq/L (ref 19–32)
Chloride: 106 mEq/L (ref 96–112)
Creatinine, Ser: 0.9 mg/dL (ref 0.4–1.5)
GFR: 94.82 mL/min (ref >60.00)
GLUCOSE: 116 mg/dL — AB (ref 70–99)
Potassium: 4.6 mEq/L (ref 3.5–5.1)
SODIUM: 137 meq/L (ref 135–145)

## 2014-10-02 LAB — HEPATIC FUNCTION PANEL
ALT: 36 U/L (ref 0–53)
AST: 77 U/L — ABNORMAL HIGH (ref 0–37)
Albumin: 3.2 g/dL — ABNORMAL LOW (ref 3.5–5.2)
Alkaline Phosphatase: 208 U/L — ABNORMAL HIGH (ref 39–117)
BILIRUBIN TOTAL: 1.2 mg/dL (ref 0.2–1.2)
Bilirubin, Direct: 0.4 mg/dL — ABNORMAL HIGH (ref 0.0–0.3)
Total Protein: 7.3 g/dL (ref 6.0–8.3)

## 2014-10-02 LAB — TSH: TSH: 0.59 u[IU]/mL (ref 0.35–4.50)

## 2014-10-02 MED ORDER — SILDENAFIL CITRATE 100 MG PO TABS: 50.0000 mg | ORAL_TABLET | Freq: Every day | ORAL | Status: AC | PRN

## 2014-10-02 MED ORDER — METOPROLOL SUCCINATE ER 25 MG PO TB24: 25.0000 mg | ORAL_TABLET | Freq: Every day | ORAL | Status: DC

## 2014-10-02 MED ORDER — LOSARTAN POTASSIUM 50 MG PO TABS: 50.0000 mg | ORAL_TABLET | Freq: Every day | ORAL | Status: DC

## 2014-10-02 MED ORDER — ATORVASTATIN CALCIUM 20 MG PO TABS: 20.0000 mg | ORAL_TABLET | Freq: Every day | ORAL | Status: DC

## 2014-10-02 NOTE — Addendum Note (Signed)
Addended by: Gwendolyn Grant A on: 10/02/2014 05:37 PM   Modules accepted: Orders

## 2014-10-02 NOTE — Assessment & Plan Note (Signed)
Started atorva 04/2012 due to poor control and CVA hx -  Improved at last check Recheck lipids and LFTs now, titrate as needed The current medical regimen is effective;  continue present plan and medications.

## 2014-10-02 NOTE — Progress Notes (Signed)
Pre visit review using our clinic review tool, if applicable. No additional management support is needed unless otherwise documented below in the visit note. 

## 2014-10-02 NOTE — Patient Instructions (Addendum)
It was good to see you today.  We have reviewed your prior records including labs and tests today  Test(s) ordered today. Your results will be released to [place] (or called to you) after review, usually within 72hours after test completion. If any changes need to be made, you will be notified at that same time.  Medications reviewed and updated Use generic Viagra as needed if labs are normal - printed prescription given to you today No other changes recommended at this time. Refill on medication(s) as discussed today.  Continue to work on smoking cessation. Also decrease beer and alcohol consumption as discussed for improved sexual performance and liver health  Please schedule followup in 6 months, call sooner if problems.  Erectile Dysfunction Erectile dysfunction is the inability to get or sustain a good enough erection to have sexual intercourse. Erectile dysfunction may involve:  Inability to get an erection.  Lack of enough hardness to allow penetration.  Loss of the erection before sex is finished.  Premature ejaculation. CAUSES  Certain drugs, such as:  Pain relievers.  Antihistamines.  Antidepressants.  Blood pressure medicines.  Water pills (diuretics).  Ulcer medicines.  Muscle relaxants.  Illegal drugs.  Excessive drinking.  Psychological causes, such as:  Anxiety.  Depression.  Sadness.  Exhaustion.  Performance fear.  Stress.  Physical causes, such as:  Artery problems. This may include diabetes, smoking, liver disease, or atherosclerosis.  High blood pressure.  Hormonal problems, such as low testosterone.  Obesity.  Nerve problems. This may include back or pelvic injuries, diabetes mellitus, multiple sclerosis, or Parkinson disease. SYMPTOMS  Inability to get an erection.  Lack of enough hardness to allow penetration.  Loss of the erection before sex is finished.  Premature ejaculation.  Normal erections at some times,  but with frequent unsatisfactory episodes.  Orgasms that are not satisfactory in sensation or frequency.  Low sexual satisfaction in either partner because of erection problems.  A curved penis occurring with erection. The curve may cause pain or may be too curved to allow for intercourse.  Never having nighttime erections. DIAGNOSIS Your caregiver can often diagnose this condition by:  Performing a physical exam to find other diseases or specific problems with the penis.  Asking you detailed questions about the problem.  Performing blood tests to check for diabetes mellitus or to measure hormone levels.  Performing urine tests to find other underlying health conditions.  Performing an ultrasound exam to check for scarring.  Performing a test to check blood flow to the penis.  Doing a sleep study at home to measure nighttime erections. TREATMENT   You may be prescribed medicines by mouth.  You may be given medicine injections into the penis.  You may be prescribed a vacuum pump with a ring.  Penile implant surgery may be performed. You may receive:  An inflatable implant.  A semirigid implant.  Blood vessel surgery may be performed. HOME CARE INSTRUCTIONS  If you are prescribed oral medicine, you should take the medicine as prescribed. Do not increase the dosage without first discussing it with your physician.  If you are using self-injections, be careful to avoid any veins that are on the surface of the penis. Apply pressure to the injection site for 5 minutes.  If you are using a vacuum pump, make sure you have read the instructions before using it. Discuss any questions with your physician before taking the pump home. SEEK MEDICAL CARE IF:  You experience pain that is not responsive  to the pain medicine you have been prescribed.  You experience nausea or vomiting. SEEK IMMEDIATE MEDICAL CARE IF:   When taking oral or injectable medications, you experience an  erection that lasts longer than 4 hours. If your physician is unavailable, go to the nearest emergency room for evaluation. An erection that lasts much longer than 4 hours can result in permanent damage to your penis.  You have pain that is severe.  You develop redness, severe pain, or severe swelling of your penis.  You have redness spreading up into your groin or lower abdomen.  You are unable to pass your urine. Document Released: 10/15/2000 Document Revised: 06/20/2013 Document Reviewed: 03/22/2013 [facility] Patient Information 2015 [place], [place]. This information is not intended to replace advice given to you by your health care provider. Make sure you discuss any questions you have with your health care provider. Smoking Cessation Quitting smoking is important to your health and has many advantages. However, it is not always easy to quit since nicotine is a very addictive drug. Oftentimes, people try 3 times or more before being able to quit. This document explains the best ways for you to prepare to quit smoking. Quitting takes hard work and a lot of effort, but you can do it. ADVANTAGES OF QUITTING SMOKING  You will live longer, feel better, and live better.  Your body will feel the impact of quitting smoking almost immediately.  Within 20 minutes, blood pressure decreases. Your pulse returns to its normal level.  After 8 hours, carbon monoxide levels in the blood return to normal. Your oxygen level increases.  After 24 hours, the chance of having a heart attack starts to decrease. Your breath, hair, and body stop smelling like smoke.  After 48 hours, damaged nerve endings begin to recover. Your sense of taste and smell improve.  After 72 hours, the body is virtually free of nicotine. Your bronchial tubes relax and breathing becomes easier.  After 2 to 12 weeks, lungs can hold more air. Exercise becomes easier and circulation improves.  The risk of having a heart attack,  stroke, cancer, or lung disease is greatly reduced.  After 1 year, the risk of coronary heart disease is cut in half.  After 5 years, the risk of stroke falls to the same as a nonsmoker.  After 10 years, the risk of lung cancer is cut in half and the risk of other cancers decreases significantly.  After 15 years, the risk of coronary heart disease drops, usually to the level of a nonsmoker.  If you are pregnant, quitting smoking will improve your chances of having a healthy baby.  The people you live with, especially any children, will be healthier.  You will have extra money to spend on things other than cigarettes. QUESTIONS TO THINK ABOUT BEFORE ATTEMPTING TO QUIT You may want to talk about your answers with your health care provider.  Why do you want to quit?  If you tried to quit in the past, what helped and what did not?  What will be the most difficult situations for you after you quit? How will you plan to handle them?  Who can help you through the tough times? Your family? Friends? A health care provider?  What pleasures do you get from smoking? What ways can you still get pleasure if you quit? Here are some questions to ask your health care provider:  How can you help me to be successful at quitting?  What medicine do you  think would be best for me and how should I take it?  What should I do if I need more help?  What is smoking withdrawal like? How can I get information on withdrawal? GET READY  Set a quit date.  Change your environment by getting rid of all cigarettes, ashtrays, matches, and lighters in your home, car, or work. Do not let people smoke in your home.  Review your past attempts to quit. Think about what worked and what did not. GET SUPPORT AND ENCOURAGEMENT You have a better chance of being successful if you have help. You can get support in many ways.  Tell your family, friends, and coworkers that you are going to quit and need their support.  Ask them not to smoke around you.  Get individual, group, or telephone counseling and support. Programs are available at [employer] and health centers. Call your local health department for information about programs in your area.  Spiritual beliefs and practices may help some smokers quit.  Download a "quit meter" on your computer to keep track of quit statistics, such as how long you have gone without smoking, cigarettes not smoked, and money saved.  Get a self-help book about quitting smoking and staying off tobacco. [place] yourself from urges to smoke. Talk to someone, go for a walk, or occupy your time with a task.  Change your normal routine. Take a different route to work. Drink tea instead of coffee. Eat breakfast in a different place.  Reduce your stress. Take a hot bath, exercise, or read a book.  Plan something enjoyable to do every day. Reward yourself for not smoking.  Explore interactive web-based programs that specialize in helping you quit. GET MEDICINE AND USE IT CORRECTLY Medicines can help you stop smoking and decrease the urge to smoke. Combining medicine with the above behavioral methods and support can greatly increase your chances of successfully quitting smoking.  Nicotine replacement therapy helps deliver nicotine to your body without the negative effects and risks of smoking. Nicotine replacement therapy includes nicotine gum, lozenges, inhalers, nasal sprays, and skin patches. Some may be available over-the-counter and others require a prescription.  Antidepressant medicine helps people abstain from smoking, but how this works is unknown. This medicine is available by prescription.  Nicotinic receptor partial agonist medicine simulates the effect of nicotine in your brain. This medicine is available by prescription. Ask your health care provider for advice about which medicines to use and how to use them based on your  health history. Your health care provider will tell you what side effects to look out for if you choose to be on a medicine or therapy. Carefully read the information on the package. Do not use any other product containing nicotine while using a nicotine replacement product.  RELAPSE OR DIFFICULT SITUATIONS Most relapses occur within the first 3 months after quitting. Do not be discouraged if you start smoking again. Remember, most people try several times before finally quitting. You may have symptoms of withdrawal because your body is used to nicotine. You may crave cigarettes, be irritable, feel very hungry, cough often, get headaches, or have difficulty concentrating. The withdrawal symptoms are only temporary. They are strongest when you first quit, but they will go away within 10-14 days. To reduce the chances of relapse, try to:  Avoid drinking alcohol. Drinking lowers your chances of successfully quitting.  Reduce the amount of caffeine you consume. Once you quit smoking, the  amount of caffeine in your body increases and can give you symptoms, such as a rapid heartbeat, sweating, and anxiety.  Avoid smokers because they can make you want to smoke.  Do not let weight gain distract you. Many smokers will gain weight when they quit, usually less than 10 pounds. Eat a healthy diet and stay active. You can always lose the weight gained after you quit.  Find ways to improve your mood other than smoking. FOR MORE INFORMATION  www.smokefree.gov  Document Released: 10/12/2001 Document Revised: 03/04/2014 Document Reviewed: 01/27/2012 [facility] Patient Information 2015 [place], [place]. This information is not intended to replace advice given to you by your health care provider. Make sure you discuss any questions you have with your health care provider.

## 2014-10-02 NOTE — Assessment & Plan Note (Signed)
Reviewed LFTs trend and [place] 12/2013 (fatty changes) Reports decreased EtOh use, now 2-3 beers/night - Recheck LFTs now and encouraged to decrease use

## 2014-10-02 NOTE — Progress Notes (Signed)
Subjective:    Patient ID: Jon Boyd, male    DOB: 1961/07/16, 53 y.o.   MRN: [id number]  HPI  Patient here for follow up Reviewed chronic medical issues and interval medical events  Past Medical History  Diagnosis Date  . Hypertension   . Chronic headaches   . Hyperlipidemia   . Stroke   . Grave's disease 04/2011    s/p I-131 tx 01/2012  . Postablative hypothyroidism   . Edema 06/03/2014    Review of Systems  Constitutional: Negative for fever, fatigue and unexpected weight change.  Respiratory: Negative for cough and shortness of breath.   Cardiovascular: Negative for chest pain and leg swelling.  Genitourinary: Negative for urgency, hematuria, decreased urine volume, discharge, penile pain and testicular pain.       ED x 6-76mo, problem maintaining, not achieving initial state of arousal  Musculoskeletal:       Cramps - hands after work       Objective:   Physical Exam  BP 130/68 mmHg  Pulse 79  Temp(Src) 98.3 F (36.8 C) (Oral)  Ht 5\' 11"  (1.803 m)  Wt 199 lb 12 oz (90.606 kg)  BMI 27.87 kg/m2  SpO2 97% Wt Readings from Last 3 Encounters:  10/02/14 199 lb 12 oz (90.606 kg)  06/01/14 204 lb 12 oz (92.874 kg)  03/28/14 203 lb (92.08 kg)   Constitutional: he appears well-developed and well-nourished. No distress.  Neck: Normal range of motion. Neck supple. No JVD present. No thyromegaly present.  Cardiovascular: Normal rate, regular rhythm and normal heart sounds.  No murmur heard. No BLE edema. Pulmonary/Chest: Effort normal and breath sounds normal. No respiratory distress. he has no wheezes.  Psychiatric: he has a normal mood and affect. His behavior is normal. Judgment and thought content normal.   Lab Results  Component Value Date   WBC 7.4 01/29/2014   HGB 13.2 01/29/2014   HCT 38.7* 01/29/2014   PLT 144.0* 01/29/2014   GLUCOSE 123* 01/29/2014   CHOL 148 01/29/2014   TRIG 200.0* 01/29/2014   HDL 52.90 01/29/2014   LDLDIRECT 170.9 06/14/2011   LDLCALC 55 01/29/2014   ALT 60* 01/29/2014   AST 117* 01/29/2014   NA 139 01/29/2014   K 3.9 01/29/2014   CL 106 01/29/2014   CREATININE 0.9 01/29/2014   BUN 14 01/29/2014   CO2 23 01/29/2014   TSH 1.46 01/29/2014   PSA 0.26 06/14/2011   HGBA1C 6.1 01/26/2013    US Abdomen Complete  02/07/2014   CLINICAL DATA:  Increased LFTs  EXAM: ULTRASOUND ABDOMEN COMPLETE  COMPARISON:  07/12/2011  FINDINGS: Gallbladder:  No gallstones or wall thickening visualized. No sonographic Murphy sign noted.  Common bile duct:  Diameter: 0.3 mm in diameter within normal limits.  Liver:  Liver measures 17.3 cm in length. Mild increased echogenicity of the liver suspicious for fatty infiltration. No focal hepatic mass. No intrahepatic biliary ductal dilatation.  IVC:  No abnormality visualized.  Pancreas:  Limited assessment due to abundant bowel gas.  Spleen:  Normal echogenicity.  Measures 12.2 cm in length.  Right Kidney:  Length: 12.9 cm. Echogenicity within normal limits. No mass or hydronephrosis visualized.  Left Kidney:  Length: 13.5 cm. Echogenicity within normal limits. No mass or hydronephrosis visualized.  Abdominal aorta:  No aneurysm visualized.  Measures up to 2.4 cm in diameter.  Other findings:  None.  IMPRESSION: 1. No gallstones are noted within gallbladder.  Normal CBD. 2. Mild increased echogenicity of the  liver suspicious for fatty infiltration. No focal hepatic mass. 3. No hydronephrosis or diagnostic renal calculus.   Electronically Signed   By: Lahoma Crocker M.D.   On: 02/07/2014 08:24       Assessment & Plan:   ED - exclude metabolic dz - tries to need to decrease alcohol consumption and smoking -printed prescription for generic sildenafil provided to use as needed presuming no lab abnormalities are identified - we reviewed potential risk/benefit and possible side effects - pt understands and agrees to same   Problem List Items Addressed This Visit    Dyslipidemia - Primary    Started atorva  04/2012 due to poor control and CVA hx -  Improved at last check Recheck lipids and LFTs now, titrate as needed The current medical regimen is effective;  continue present plan and medications.     Relevant Medications      atorvastatin (LIPITOR) tablet   Other Relevant Orders      Lipid panel   Hepatomegaly    Reviewed LFTs trend and [place] 12/2013 (fatty changes) Reports decreased EtOh use, now 2-3 beers/night - Recheck LFTs now and encouraged to decrease use    Relevant Orders      Hepatic function panel   Hypertension    Started on beta-blocker 04/2011 - Added ARB 07/2012 -  The current medical regimen is effective;  continue present plan and medications.  BP Readings from Last 3 Encounters:  10/02/14 130/68  06/01/14 112/74  03/28/14 102/60      Relevant Medications      sildenafil (VIAGRA) tablet      losartan (COZAAR) tablet      metoprolol succinate (TOPROL-XL) 24 hr tablet      atorvastatin (LIPITOR) tablet   Other Relevant Orders      Basic metabolic panel      Lipid panel   Hypothyroidism    Prior Graves hyperthyroid Incidental dx on labs 04/2011 -  on MMZ until 10/2011 - then I-131 01/2012 ablation On replacement thyroid since late summer 2013 Check TSH and adjust as needed  Lab Results  Component Value Date   TSH 1.46 01/29/2014      Relevant Medications      metoprolol succinate (TOPROL-XL) 24 hr tablet   Other Relevant Orders      TSH

## 2014-10-02 NOTE — Assessment & Plan Note (Signed)
Started on beta-blocker 04/2011 - Added ARB 07/2012 -  The current medical regimen is effective;  continue present plan and medications.  BP Readings from Last 3 Encounters:  10/02/14 130/68  06/01/14 112/74  03/28/14 102/60

## 2014-10-02 NOTE — Assessment & Plan Note (Signed)
Prior Graves hyperthyroid Incidental dx on labs 04/2011 -  on MMZ until 10/2011 - then I-131 01/2012 ablation On replacement thyroid since late summer 2013 Check TSH and adjust as needed  Lab Results  Component Value Date   TSH 1.46 01/29/2014

## 2014-10-19 ENCOUNTER — Other Ambulatory Visit: Payer: Self-pay | Admitting: Internal Medicine

## 2014-11-08 ENCOUNTER — Other Ambulatory Visit: Payer: Self-pay | Admitting: Family Medicine

## 2014-11-08 NOTE — Telephone Encounter (Signed)
Med filled last by Dr. Asa Lente.  Message routed to her.

## 2014-11-11 ENCOUNTER — Other Ambulatory Visit: Payer: Self-pay

## 2014-11-11 ENCOUNTER — Other Ambulatory Visit: Payer: Self-pay | Admitting: Family Medicine

## 2014-11-11 MED ORDER — ALPRAZOLAM 0.5 MG PO TABS: 0.5000 mg | ORAL_TABLET | Freq: Two times a day (BID) | ORAL | Status: DC | PRN

## 2014-11-14 NOTE — Telephone Encounter (Signed)
Rx filled on (11/11/14).//AB/CMA

## 2014-12-23 ENCOUNTER — Other Ambulatory Visit: Payer: Self-pay | Admitting: Internal Medicine

## 2015-01-28 ENCOUNTER — Encounter: Payer: Self-pay | Admitting: Gastroenterology

## 2015-02-10 ENCOUNTER — Encounter: Payer: Self-pay | Admitting: Gastroenterology

## 2015-03-21 ENCOUNTER — Other Ambulatory Visit: Payer: Self-pay | Admitting: Internal Medicine

## 2015-04-07 ENCOUNTER — Other Ambulatory Visit (INDEPENDENT_AMBULATORY_CARE_PROVIDER_SITE_OTHER): Payer: BLUE CROSS/BLUE SHIELD

## 2015-04-07 ENCOUNTER — Encounter: Payer: Self-pay | Admitting: Internal Medicine

## 2015-04-07 ENCOUNTER — Ambulatory Visit (INDEPENDENT_AMBULATORY_CARE_PROVIDER_SITE_OTHER): Payer: BLUE CROSS/BLUE SHIELD | Admitting: Internal Medicine

## 2015-04-07 VITALS — BP 126/66 | HR 78 | Temp 98.0°F | Ht 71.0 in | Wt 207.5 lb

## 2015-04-07 DIAGNOSIS — R251 Tremor, unspecified: Secondary | ICD-10-CM | POA: Diagnosis not present

## 2015-04-07 DIAGNOSIS — R739 Hyperglycemia, unspecified: Secondary | ICD-10-CM

## 2015-04-07 DIAGNOSIS — E038 Other specified hypothyroidism: Secondary | ICD-10-CM

## 2015-04-07 DIAGNOSIS — Z114 Encounter for screening for human immunodeficiency virus [HIV]: Secondary | ICD-10-CM

## 2015-04-07 DIAGNOSIS — H539 Unspecified visual disturbance: Secondary | ICD-10-CM

## 2015-04-07 DIAGNOSIS — Z0000 Encounter for general adult medical examination without abnormal findings: Secondary | ICD-10-CM

## 2015-04-07 DIAGNOSIS — R7989 Other specified abnormal findings of blood chemistry: Secondary | ICD-10-CM | POA: Diagnosis not present

## 2015-04-07 DIAGNOSIS — Z1159 Encounter for screening for other viral diseases: Secondary | ICD-10-CM

## 2015-04-07 LAB — HEPATIC FUNCTION PANEL
ALT: 31 U/L (ref 0–53)
AST: 85 U/L — ABNORMAL HIGH (ref 0–37)
Albumin: 3.1 g/dL — ABNORMAL LOW (ref 3.5–5.2)
Alkaline Phosphatase: 206 U/L — ABNORMAL HIGH (ref 39–117)
BILIRUBIN DIRECT: 0.8 mg/dL — AB (ref 0.0–0.3)
TOTAL PROTEIN: 6.8 g/dL (ref 6.0–8.3)
Total Bilirubin: 1.8 mg/dL — ABNORMAL HIGH (ref 0.2–1.2)

## 2015-04-07 LAB — BASIC METABOLIC PANEL
BUN: 28 mg/dL — ABNORMAL HIGH (ref 6–23)
CO2: 23 mEq/L (ref 19–32)
CREATININE: 1.23 mg/dL (ref 0.40–1.50)
Calcium: 7.9 mg/dL — ABNORMAL LOW (ref 8.4–10.5)
Chloride: 99 mEq/L (ref 96–112)
GFR: 65.15 mL/min (ref >60.00)
GLUCOSE: 139 mg/dL — AB (ref 70–99)
POTASSIUM: 4.5 meq/L (ref 3.5–5.1)
SODIUM: 129 meq/L — AB (ref 135–145)

## 2015-04-07 LAB — LIPID PANEL
Cholesterol: 156 mg/dL (ref 0–200)
HDL: 35.2 mg/dL — AB (ref >39.00)
NonHDL: 120.8
TRIGLYCERIDES: 208 mg/dL — AB (ref 0.0–149.0)
Total CHOL/HDL Ratio: 4
VLDL: 41.6 mg/dL — AB (ref 0.0–40.0)

## 2015-04-07 LAB — CBC WITH DIFFERENTIAL/PLATELET
Basophils Absolute: 0 10*3/uL (ref 0.0–0.1)
Basophils Relative: 0.3 % (ref 0.0–3.0)
Eosinophils Absolute: 0.2 10*3/uL (ref 0.0–0.7)
Eosinophils Relative: 1.6 % (ref 0.0–5.0)
HEMATOCRIT: 35.3 % — AB (ref 39.0–52.0)
Hemoglobin: 12.2 g/dL — ABNORMAL LOW (ref 13.0–17.0)
LYMPHS PCT: 19.1 % (ref 12.0–46.0)
Lymphs Abs: 2.2 10*3/uL (ref 0.7–4.0)
MCHC: 34.5 g/dL (ref 30.0–36.0)
MCV: 100.1 fl — ABNORMAL HIGH (ref 78.0–100.0)
MONOS PCT: 9 % (ref 3.0–12.0)
Monocytes Absolute: 1 10*3/uL (ref 0.1–1.0)
NEUTROS ABS: 7.9 10*3/uL — AB (ref 1.4–7.7)
NEUTROS PCT: 70 % (ref 43.0–77.0)
Platelets: 121 10*3/uL — ABNORMAL LOW (ref 150.0–400.0)
RBC: 3.53 Mil/uL — ABNORMAL LOW (ref 4.22–5.81)
RDW: 18.4 % — AB (ref 11.5–15.5)
WBC: 11.3 10*3/uL — ABNORMAL HIGH (ref 4.0–10.5)

## 2015-04-07 LAB — TSH: TSH: 10.77 u[IU]/mL — ABNORMAL HIGH (ref 0.35–4.50)

## 2015-04-07 LAB — LDL CHOLESTEROL, DIRECT: Direct LDL: 51 mg/dL

## 2015-04-07 LAB — HEMOGLOBIN A1C: HEMOGLOBIN A1C: 5.4 % (ref 4.6–6.5)

## 2015-04-07 MED ORDER — LEVOTHYROXINE SODIUM 200 MCG PO TABS: 200.0000 ug | ORAL_TABLET | Freq: Every day | ORAL | Status: DC

## 2015-04-07 NOTE — Progress Notes (Signed)
Pre visit review using our clinic review tool, if applicable. No additional management support is needed unless otherwise documented below in the visit note. 

## 2015-04-07 NOTE — Assessment & Plan Note (Signed)
History reviewed. Weight trend reviewed. Check A1c now and annually Lab Results  Component Value Date   HGBA1C 6.1 01/26/2013

## 2015-04-07 NOTE — Progress Notes (Signed)
Subjective:    Patient ID: Jon Boyd, male    DOB: 10/08/1961, 54 y.o.   MRN: [id number]  HPI  patient is here today for annual physical. Patient feels well overall. Reviewed chronic conditions, interval events current concerns  Past Medical History  Diagnosis Date  . Hypertension   . Chronic headaches   . Hyperlipidemia   . Stroke   . Grave's disease 04/2011    s/p I-131 tx 01/2012  . Postablative hypothyroidism   . Edema 06/03/2014   Family History  Problem Relation Age of Onset  . COPD Mother   . Hyperthyroidism Sister   . Colon cancer Father 70  . Tremor Mother    History  Substance Use Topics  . Smoking status: Current Every Day Smoker -- 1.00 packs/day for 35 years    Types: Cigarettes  . Smokeless tobacco: Never Used  . Alcohol Use: 8.4 oz/week    14 Cans of beer per week    Review of Systems  Constitutional: Negative for fever, activity change, appetite change, fatigue and unexpected weight change.  Eyes: Positive for visual disturbance (blurry up close).  Respiratory: Negative for cough, chest tightness, shortness of breath and wheezing.   Cardiovascular: Negative for chest pain, palpitations and leg swelling.  Neurological: Positive for tremors (chronic hands and head/neck). Negative for dizziness, weakness and headaches.  Psychiatric/Behavioral: Negative for dysphoric mood. The patient is not nervous/anxious.   All other systems reviewed and are negative.      Objective:    Physical Exam  Constitutional: He is oriented to person, place, and time. He appears well-developed and well-nourished. No distress.  HENT:  Head: Normocephalic and atraumatic.  Nose: Nose normal.  Mouth/Throat: Oropharynx is clear and moist.  Hearing grossly normal.  Eyes: Conjunctivae and EOM are normal. Pupils are equal, round, and reactive to light. No scleral icterus.  Neck: Normal range of motion. Neck supple. No JVD present. No thyromegaly present.  Cardiovascular: Normal  rate, regular rhythm, normal heart sounds and intact distal pulses.  Exam reveals no friction rub.   No murmur heard. No edema.  Pulmonary/Chest: Effort normal and breath sounds normal. No respiratory distress. He has no wheezes.  Abdominal: Soft. Bowel sounds are normal. He exhibits no distension and no mass. There is no tenderness. There is no guarding.  Genitourinary:  defer  Musculoskeletal: Normal range of motion. He exhibits no edema or tenderness.  Lymphadenopathy:    He has no cervical adenopathy.  Neurological: He is alert and oriented to person, place, and time. He has normal reflexes. No cranial nerve deficit. Coordination normal.  Intention tremor bilateral hands, neck, head and detectable in voice. Appears consistent with essential tremor  Skin: Skin is warm and dry. No rash noted. No erythema.  Psychiatric: He has a normal mood and affect. His behavior is normal. Thought content normal.    BP 126/66 mmHg  Pulse 78  Temp(Src) 98 F (36.7 C) (Oral)  Ht 5\' 11"  (1.803 m)  Wt 207 lb 8 oz (94.121 kg)  BMI 28.95 kg/m2  SpO2 96% Wt Readings from Last 3 Encounters:  04/07/15 207 lb 8 oz (94.121 kg)  10/02/14 199 lb 12 oz (90.606 kg)  06/01/14 204 lb 12 oz (92.874 kg)     Lab Results  Component Value Date   WBC 7.4 01/29/2014   HGB 13.2 01/29/2014   HCT 38.7* 01/29/2014   PLT 144.0* 01/29/2014   GLUCOSE 116* 10/02/2014   CHOL 131 10/02/2014  TRIG 81.0 10/02/2014   HDL 37.10* 10/02/2014   LDLDIRECT 170.9 06/14/2011   LDLCALC 78 10/02/2014   ALT 36 10/02/2014   AST 77* 10/02/2014   NA 137 10/02/2014   K 4.6 10/02/2014   CL 106 10/02/2014   CREATININE 0.9 10/02/2014   BUN 17 10/02/2014   CO2 23 10/02/2014   TSH 0.59 10/02/2014   PSA 0.26 06/14/2011   HGBA1C 6.1 01/26/2013    US Abdomen Complete  02/07/2014   CLINICAL DATA:  Increased LFTs  EXAM: ULTRASOUND ABDOMEN COMPLETE  COMPARISON:  07/12/2011  FINDINGS: Gallbladder:  No gallstones or wall thickening  visualized. No sonographic Murphy sign noted.  Common bile duct:  Diameter: 0.3 mm in diameter within normal limits.  Liver:  Liver measures 17.3 cm in length. Mild increased echogenicity of the liver suspicious for fatty infiltration. No focal hepatic mass. No intrahepatic biliary ductal dilatation.  IVC:  No abnormality visualized.  Pancreas:  Limited assessment due to abundant bowel gas.  Spleen:  Normal echogenicity.  Measures 12.2 cm in length.  Right Kidney:  Length: 12.9 cm. Echogenicity within normal limits. No mass or hydronephrosis visualized.  Left Kidney:  Length: 13.5 cm. Echogenicity within normal limits. No mass or hydronephrosis visualized.  Abdominal aorta:  No aneurysm visualized.  Measures up to 2.4 cm in diameter.  Other findings:  None.  IMPRESSION: 1. No gallstones are noted within gallbladder.  Normal CBD. 2. Mild increased echogenicity of the liver suspicious for fatty infiltration. No focal hepatic mass. 3. No hydronephrosis or diagnostic renal calculus.   Electronically Signed   By: Lahoma Crocker M.D.   On: 02/07/2014 08:24       Assessment & Plan:   CPX/z00.00- Patient has been counseled on age-appropriate routine health concerns for screening and prevention. These are reviewed and up-to-date. Immunizations are up-to-date or declined. Labs ordered and reviewed.  Problem List Items Addressed This Visit    Hyperglycemia    History reviewed. Weight trend reviewed. Check A1c now and annually Lab Results  Component Value Date   HGBA1C 6.1 01/26/2013         Relevant Orders   Basic metabolic panel   Lipid panel   Hemoglobin A1c   Hypothyroidism    Prior Graves hyperthyroid Incidental dx on labs 04/2011 -  on MMZ until 10/2011 - then I-131 01/2012 ablation On replacement thyroid since late summer 2013 Check TSH and adjust as needed  Lab Results  Component Value Date   TSH 0.59 10/02/2014        Relevant Medications   levothyroxine (SYNTHROID, LEVOTHROID) 200 MCG  tablet   Tremor of both hands    Progressive symptoms despite low-dose beta blocker for CAD Family history of tremor and mother History exam consistent with essential tremor Check labs Reviewed need to reduce beer intake Refer to neurology for second opinion on evaluation and treatment of same      Relevant Orders   Ambulatory referral to Neurology    Other Visit Diagnoses    Routine general medical examination at a health care facility    -  Primary    Relevant Orders    Basic metabolic panel    CBC with Differential/Platelet    Hepatic function panel    Lipid panel    TSH    Change in vision        Relevant Orders    Ambulatory referral to Ophthalmology    Encounter for screening for HIV  Relevant Orders    HIV antibody    Need for hepatitis C screening test        Relevant Orders    Hepatitis C antibody        Gwendolyn Grant, MD

## 2015-04-07 NOTE — Assessment & Plan Note (Signed)
Prior Graves hyperthyroid Incidental dx on labs 04/2011 -  on MMZ until 10/2011 - then I-131 01/2012 ablation On replacement thyroid since late summer 2013 Check TSH and adjust as needed  Lab Results  Component Value Date   TSH 0.59 10/02/2014

## 2015-04-07 NOTE — Patient Instructions (Addendum)
It was good to see you today.  We have reviewed your prior records including labs and tests today  Health Maintenance reviewed - colon screen this month for follow up as planned. All other recommended immunizations and age-appropriate screenings are up-to-date.  Test(s) ordered today. Your results will be released to [facility] (or called to you) after review, usually within 72hours after test completion. If any changes need to be made, you will be notified at that same time.  Medications reviewed and updated, no changes recommended at this time.  we'll make referral to neurologist for evaluation of tremor and to ophthalmology for evaluation of vision changes . Our office will contact you regarding appointment(s) once made.  Please schedule followup in 6-12 months for semiannual exam and labs, call sooner if problems.  Health Maintenance A healthy lifestyle and preventative care can promote health and wellness.  Maintain regular health, dental, and eye exams.  Eat a healthy diet. Foods like vegetables, fruits, whole grains, low-fat dairy products, and lean protein foods contain the nutrients you need and are low in calories. Decrease your intake of foods high in solid fats, added sugars, and salt. Get information about a proper diet from your health care provider, if necessary.  Regular physical exercise is one of the most important things you can do for your health. Most adults should get at least 150 minutes of moderate-intensity exercise (any activity that increases your heart rate and causes you to sweat) each week. In addition, most adults need muscle-strengthening exercises on 2 or more days a week.   Maintain a healthy weight. The body mass index (BMI) is a screening tool to identify possible weight problems. It provides an estimate of body fat based on height and weight. Your health care provider can find your BMI and can help you achieve or maintain a healthy weight. For males 20 years  and older:  A BMI below 18.5 is considered underweight.  A BMI of 18.5 to 24.9 is normal.  A BMI of 25 to 29.9 is considered overweight.  A BMI of 30 and above is considered obese.  Maintain normal blood lipids and cholesterol by exercising and minimizing your intake of saturated fat. Eat a balanced diet with plenty of fruits and vegetables. Blood tests for lipids and cholesterol should begin at age 14 and be repeated every 5 years. If your lipid or cholesterol levels are high, you are over age 33, or you are at high risk for heart disease, you may need your cholesterol levels checked more frequently.Ongoing high lipid and cholesterol levels should be treated with medicines if diet and exercise are not working.  If you smoke, find out from your health care provider how to quit. If you do not use tobacco, do not start.  Lung cancer screening is recommended for adults aged 29-80 years who are at high risk for developing lung cancer because of a history of smoking. A yearly low-dose CT scan of the lungs is recommended for people who have at least a 30-pack-year history of smoking and are current smokers or have quit within the past 15 years. A pack year of smoking is smoking an average of 1 pack of cigarettes a day for 1 year (for example, a 30-pack-year history of smoking could mean smoking 1 pack a day for 30 years or 2 packs a day for 15 years). Yearly screening should continue until the smoker has stopped smoking for at least 15 years. Yearly screening should be stopped for people  who develop a health problem that would prevent them from having lung cancer treatment.  If you choose to drink alcohol, do not have more than 2 drinks per day. One drink is considered to be 12 oz (360 mL) of beer, 5 oz (150 mL) of wine, or 1.5 oz (45 mL) of liquor.  Avoid the use of street drugs. Do not share needles with anyone. Ask for help if you need support or instructions about stopping the use of drugs.  High  blood pressure causes heart disease and increases the risk of stroke. Blood pressure should be checked at least every 1-2 years. Ongoing high blood pressure should be treated with medicines if weight loss and exercise are not effective.  If you are 85-16 years old, ask your health care provider if you should take aspirin to prevent heart disease.  Diabetes screening involves taking a blood sample to check your fasting blood sugar level. This should be done once every 3 years after age 9 if you are at a normal weight and without risk factors for diabetes. Testing should be considered at a younger age or be carried out more frequently if you are overweight and have at least 1 risk factor for diabetes.  Colorectal cancer can be detected and often prevented. Most routine colorectal cancer screening begins at the age of 69 and continues through age 26. However, your health care provider may recommend screening at an earlier age if you have risk factors for colon cancer. On a yearly basis, your health care provider may provide home test kits to check for hidden blood in the stool. A small camera at the end of a tube may be used to directly examine the colon (sigmoidoscopy or colonoscopy) to detect the earliest forms of colorectal cancer. Talk to your health care provider about this at age 89 when routine screening begins. A direct exam of the colon should be repeated every 5-10 years through age 81, unless early forms of precancerous polyps or small growths are found.  People who are at an increased risk for hepatitis B should be screened for this virus. You are considered at high risk for hepatitis B if:  You were born in a country where hepatitis B occurs often. Talk with your health care provider about which countries are considered high risk.  Your parents were born in a high-risk country and you have not received a shot to protect against hepatitis B (hepatitis B vaccine).  You have HIV or AIDS.  You  use needles to inject street drugs.  You live with, or have sex with, someone who has hepatitis B.  You are a man who has sex with other men (MSM).  You get hemodialysis treatment.  You take certain medicines for conditions like cancer, organ transplantation, and autoimmune conditions.  Hepatitis C blood testing is recommended for all people born from [birth year] through 1965 and any individual with known risk factors for hepatitis C.  Healthy men should no longer receive prostate-specific antigen (PSA) blood tests as part of routine cancer screening. Talk to your health care provider about prostate cancer screening.  Testicular cancer screening is not recommended for adolescents or adult males who have no symptoms. Screening includes self-exam, a health care provider exam, and other screening tests. Consult with your health care provider about any symptoms you have or any concerns you have about testicular cancer.  Practice safe sex. Use condoms and avoid high-risk sexual practices to reduce the spread of sexually transmitted infections (  STIs).  You should be screened for STIs, including gonorrhea and chlamydia if:  You are sexually active and are younger than 24 years.  You are older than 24 years, and your health care provider tells you that you are at risk for this type of infection.  Your sexual activity has changed since you were last screened, and you are at an increased risk for chlamydia or gonorrhea. Ask your health care provider if you are at risk.  If you are at risk of being infected with HIV, it is recommended that you take a prescription medicine daily to prevent HIV infection. This is called pre-exposure prophylaxis (PrEP). You are considered at risk if:  You are a man who has sex with other men (MSM).  You are a heterosexual man who is sexually active with multiple partners.  You take drugs by injection.  You are sexually active with a partner who has HIV.  Talk with  your health care provider about whether you are at high risk of being infected with HIV. If you choose to begin PrEP, you should first be tested for HIV. You should then be tested every 3 months for as long as you are taking PrEP.  Use sunscreen. Apply sunscreen liberally and repeatedly throughout the day. You should seek shade when your shadow is shorter than you. Protect yourself by wearing long sleeves, pants, a wide-brimmed hat, and sunglasses year round whenever you are outdoors.  Tell your health care provider of new moles or changes in moles, especially if there is a change in shape or color. Also, tell your health care provider if a mole is larger than the size of a pencil eraser.  A one-time screening for abdominal aortic aneurysm (AAA) and surgical repair of large AAAs by ultrasound is recommended for men aged 7-75 years who are current or former smokers.  Stay current with your vaccines (immunizations). Document Released: 04/15/2008 Document Revised: 10/23/2013 Document Reviewed: 03/15/2011 [facility] Patient Information 2015 [place], [place]. This information is not intended to replace advice given to you by your health care provider. Make sure you discuss any questions you have with your health care provider.

## 2015-04-07 NOTE — Assessment & Plan Note (Signed)
Progressive symptoms despite low-dose beta blocker for CAD Family history of tremor and mother History exam consistent with essential tremor Check labs Reviewed need to reduce beer intake Refer to neurology for second opinion on evaluation and treatment of same

## 2015-04-08 ENCOUNTER — Other Ambulatory Visit: Payer: Self-pay | Admitting: Internal Medicine

## 2015-04-08 DIAGNOSIS — R160 Hepatomegaly, not elsewhere classified: Secondary | ICD-10-CM

## 2015-04-08 DIAGNOSIS — R7989 Other specified abnormal findings of blood chemistry: Secondary | ICD-10-CM

## 2015-04-08 DIAGNOSIS — E038 Other specified hypothyroidism: Secondary | ICD-10-CM

## 2015-04-08 DIAGNOSIS — E871 Hypo-osmolality and hyponatremia: Secondary | ICD-10-CM

## 2015-04-08 DIAGNOSIS — R945 Abnormal results of liver function studies: Secondary | ICD-10-CM

## 2015-04-08 LAB — HEPATITIS C ANTIBODY: HCV Ab: NEGATIVE

## 2015-04-08 LAB — HIV ANTIBODY (ROUTINE TESTING W REFLEX): HIV: NONREACTIVE

## 2015-04-08 MED ORDER — LEVOTHYROXINE SODIUM 25 MCG PO TABS: 25.0000 ug | ORAL_TABLET | Freq: Every day | ORAL | Status: DC

## 2015-04-10 ENCOUNTER — Ambulatory Visit (AMBULATORY_SURGERY_CENTER): Payer: Self-pay | Admitting: *Deleted

## 2015-04-10 VITALS — Ht 71.0 in | Wt 199.2 lb

## 2015-04-10 DIAGNOSIS — Z86010 Personal history of colonic polyps: Secondary | ICD-10-CM

## 2015-04-10 MED ORDER — NA SULFATE-K SULFATE-MG SULF 17.5-3.13-1.6 GM/177ML PO SOLN: 1.0000 | Freq: Once | ORAL | Status: DC

## 2015-04-10 NOTE — Progress Notes (Signed)
No egg/soy allergy No diet pills No home 02 No issues with past sedation emmi declined

## 2015-04-15 ENCOUNTER — Ambulatory Visit: Payer: BLUE CROSS/BLUE SHIELD | Admitting: Neurology

## 2015-04-24 ENCOUNTER — Encounter: Payer: Self-pay | Admitting: Gastroenterology

## 2015-04-24 ENCOUNTER — Other Ambulatory Visit (INDEPENDENT_AMBULATORY_CARE_PROVIDER_SITE_OTHER): Payer: BLUE CROSS/BLUE SHIELD

## 2015-04-24 ENCOUNTER — Ambulatory Visit (AMBULATORY_SURGERY_CENTER): Payer: BLUE CROSS/BLUE SHIELD | Admitting: Gastroenterology

## 2015-04-24 VITALS — BP 129/70 | HR 65 | Temp 98.1°F | Resp 18 | Ht 71.0 in | Wt 199.0 lb

## 2015-04-24 DIAGNOSIS — Z86010 Personal history of colonic polyps: Secondary | ICD-10-CM

## 2015-04-24 DIAGNOSIS — D124 Benign neoplasm of descending colon: Secondary | ICD-10-CM | POA: Diagnosis not present

## 2015-04-24 DIAGNOSIS — D122 Benign neoplasm of ascending colon: Secondary | ICD-10-CM

## 2015-04-24 DIAGNOSIS — D123 Benign neoplasm of transverse colon: Secondary | ICD-10-CM

## 2015-04-24 DIAGNOSIS — K648 Other hemorrhoids: Secondary | ICD-10-CM

## 2015-04-24 DIAGNOSIS — E871 Hypo-osmolality and hyponatremia: Secondary | ICD-10-CM | POA: Diagnosis not present

## 2015-04-24 DIAGNOSIS — D120 Benign neoplasm of cecum: Secondary | ICD-10-CM | POA: Diagnosis not present

## 2015-04-24 DIAGNOSIS — K5730 Diverticulosis of large intestine without perforation or abscess without bleeding: Secondary | ICD-10-CM

## 2015-04-24 LAB — BASIC METABOLIC PANEL
BUN: 8 mg/dL (ref 6–23)
CHLORIDE: 110 meq/L (ref 96–112)
CO2: 24 mEq/L (ref 19–32)
CREATININE: 0.77 mg/dL (ref 0.40–1.50)
Calcium: 9 mg/dL (ref 8.4–10.5)
GFR: 111.83 mL/min (ref >60.00)
GLUCOSE: 109 mg/dL — AB (ref 70–99)
POTASSIUM: 4.6 meq/L (ref 3.5–5.1)
Sodium: 140 mEq/L (ref 135–145)

## 2015-04-24 MED ORDER — SODIUM CHLORIDE 0.9 % IV SOLN: 500.0000 mL | INTRAVENOUS | Status: DC

## 2015-04-24 NOTE — Op Note (Signed)
[place]  [address] [place], [id number]   COLONOSCOPY PROCEDURE REPORT  PATIENT: Jon Boyd, Jon Boyd  MR#: [id number] BIRTHDATE: 1961-03-16 , 65  yrs. old GENDER: male ENDOSCOPIST: Inda Castle, MD REFERRED EP:PIRJJOA Asa Lente, M.D. PROCEDURE DATE:  04/24/2015 PROCEDURE:   Colonoscopy, surveillance , Colonoscopy with cold biopsy polypectomy, and Colonoscopy with snare polypectomy First Screening Colonoscopy - Avg.  risk and is 50 yrs.  old or older - No.  Prior Negative Screening - Now for repeat screening. N/A  History of Adenoma - Now for follow-up colonoscopy & has been > or = to 3 yrs.  No.  It has been less than 3 yrs since last colonoscopy.  Other: See Comments  Polyps removed today? Yes ASA CLASS:   Class II INDICATIONS:PH Colon Adenoma.greater than 10 polyps removed 2015 MEDICATIONS: Monitored anesthesia care and Propofol 340 mg IV  DESCRIPTION OF PROCEDURE:   After the risks benefits and alternatives of the procedure were thoroughly explained, informed consent was obtained.  The digital rectal exam revealed no abnormalities of the rectum.   The LB [id number]  endoscope was introduced through the anus and advanced to the cecum, which was identified by both the appendix and ileocecal valve. No adverse events experienced.   The quality of the prep was (Suprep was used) excellent.  The instrument was then slowly withdrawn as the colon was fully examined. Estimated blood loss is zero unless otherwise noted in this procedure report.      COLON FINDINGS: A sessile polyp measuring 2 mm in size was found at the cecum.  A polypectomy was performed with cold forceps.   A sessile polyp measuring 2 mm in size was found in the ascending colon.  A polypectomy was performed with cold forceps.   A sessile polyp measuring 2 mm in size was found in the ascending colon.  A polypectomy was performed with cold forceps.  The resection was complete, the polyp  tissue was completely retrieved and sent to histology.   There was mild diverticulosis noted in the ascending colon.      Three sessile polyps ranging from 3 to 37mm in size were found in the descending colon.  A polypectomy was performed.  A polypectomy was performed with a cold snare.  The resection was complete, the polyp tissue was completely retrieved and sent to histology.   Internal hemorrhoids were found.  Retroflexed views revealed no abnormalities. The time to cecum = 3.8 Withdrawal time = 10.1   The scope was withdrawn and the procedure completed. COMPLICATIONS: There were no immediate complications.  ENDOSCOPIC IMPRESSION: 1.   Sessile polyp was found at the cecum; polypectomy was performed with cold forceps 2.   Sessile polyp was found in the ascending colon; polypectomy was performed with cold forceps 3.   Sessile polyp was found in the ascending colon; polypectomy was performed with cold forceps 4.   Mild diverticulosis was noted in the ascending colon 5.   Three sessile polyps ranging from 3 to 58mm in size were found in the descending colon; polypectomy was performed; polypectomy was performed with a cold snare 6.   Internal hemorrhoids  RECOMMENDATIONS: Repeat Colonoscopy in 3 years.  eSigned:  Inda Castle, MD 04/24/2015 8:11 AM   cc:   PATIENT NAME:  Jon Boyd, Jon Boyd MR#: [id number]

## 2015-04-24 NOTE — Progress Notes (Signed)
Called to room to assist during endoscopic procedure.  Patient ID and intended procedure confirmed with present staff. Received instructions for my participation in the procedure from the performing physician.  

## 2015-04-24 NOTE — Progress Notes (Signed)
Report to PACU, RN, vss, BBS= Clear.  

## 2015-04-24 NOTE — Patient Instructions (Signed)
YOU HAD AN ENDOSCOPIC PROCEDURE TODAY AT THE [facility]:   Refer to the procedure report that was given to you for any specific questions about what was found during the examination.  If the procedure report does not answer your questions, please call your gastroenterologist to clarify.  If you requested that your care partner not be given the details of your procedure findings, then the procedure report has been included in a sealed envelope for you to review at your convenience later.  YOU SHOULD EXPECT: Some feelings of bloating in the abdomen. Passage of more gas than usual.  Walking can help get rid of the air that was put into your GI tract during the procedure and reduce the bloating. If you had a lower endoscopy (such as a colonoscopy or flexible sigmoidoscopy) you may notice spotting of blood in your stool or on the toilet paper. If you underwent a bowel prep for your procedure, you may not have a normal bowel movement for a few days.  Please Note:  You might notice some irritation and congestion in your nose or some drainage.  This is from the oxygen used during your procedure.  There is no need for concern and it should clear up in a day or so.  SYMPTOMS TO REPORT IMMEDIATELY:   Following lower endoscopy (colonoscopy or flexible sigmoidoscopy):  Excessive amounts of blood in the stool  Significant tenderness or worsening of abdominal pains  Swelling of the abdomen that is new, acute  Fever of 100F or higher   For urgent or emergent issues, a gastroenterologist can be reached at any hour by calling [phone].   DIET: Your first meal following the procedure should be a small meal and then it is ok to progress to your normal diet. Heavy or fried foods are harder to digest and may make you feel nauseous or bloated.  Likewise, meals heavy in dairy and vegetables can increase bloating.  Drink plenty of fluids but you should avoid alcoholic beverages for 24  hours.  ACTIVITY:  You should plan to take it easy for the rest of today and you should NOT DRIVE or use heavy machinery until tomorrow (because of the sedation medicines used during the test).    FOLLOW UP: Our staff will call the number listed on your records the next business day following your procedure to check on you and address any questions or concerns that you may have regarding the information given to you following your procedure. If we do not reach you, we will leave a message.  However, if you are feeling well and you are not experiencing any problems, there is no need to return our call.  We will assume that you have returned to your regular daily activities without incident.  If any biopsies were taken you will be contacted by phone or by letter within the next 1-3 weeks.  Please call us at [phone] if you have not heard about the biopsies in 3 weeks.    SIGNATURES/CONFIDENTIALITY: You and/or your care partner have signed paperwork which will be entered into your electronic medical record.  These signatures attest to the fact that that the information above on your After Visit Summary has been reviewed and is understood.  Full responsibility of the confidentiality of this discharge information lies with you and/or your care-partner. 

## 2015-04-25 ENCOUNTER — Telehealth: Payer: Self-pay | Admitting: *Deleted

## 2015-04-25 NOTE — Telephone Encounter (Signed)
  Follow up Call-  Call back number 04/24/2015 03/28/2014  Post procedure Call Back phone  # [phone]  Permission to leave phone message Yes Yes     Patient questions:  Do you have a fever, pain , or abdominal swelling? No. Pain Score  0 *  Have you tolerated food without any problems? Yes.    Have you been able to return to your normal activities? Yes.    Do you have any questions about your discharge instructions: Diet   No. Medications  No. Follow up visit  No.  Do you have questions or concerns about your Care? No.  Actions: * If pain score is 4 or above: No action needed, pain <4.

## 2015-05-01 ENCOUNTER — Encounter: Payer: Self-pay | Admitting: Gastroenterology

## 2015-05-08 ENCOUNTER — Encounter: Payer: Self-pay | Admitting: Internal Medicine

## 2015-08-26 ENCOUNTER — Other Ambulatory Visit: Payer: Self-pay

## 2015-08-26 ENCOUNTER — Other Ambulatory Visit: Payer: Self-pay | Admitting: Internal Medicine

## 2015-08-26 MED ORDER — LOSARTAN POTASSIUM 50 MG PO TABS: 50.0000 mg | ORAL_TABLET | Freq: Every day | ORAL | Status: DC

## 2015-08-26 MED ORDER — ATORVASTATIN CALCIUM 20 MG PO TABS: 20.0000 mg | ORAL_TABLET | Freq: Every day | ORAL | Status: DC

## 2015-10-07 ENCOUNTER — Encounter: Payer: Self-pay | Admitting: Internal Medicine

## 2015-10-07 ENCOUNTER — Other Ambulatory Visit (INDEPENDENT_AMBULATORY_CARE_PROVIDER_SITE_OTHER): Payer: PRIVATE HEALTH INSURANCE

## 2015-10-07 ENCOUNTER — Ambulatory Visit (INDEPENDENT_AMBULATORY_CARE_PROVIDER_SITE_OTHER): Payer: PRIVATE HEALTH INSURANCE | Admitting: Internal Medicine

## 2015-10-07 ENCOUNTER — Ambulatory Visit: Payer: BLUE CROSS/BLUE SHIELD | Admitting: Internal Medicine

## 2015-10-07 VITALS — BP 138/82 | HR 99 | Temp 98.0°F | Resp 18 | Ht 71.0 in | Wt 202.0 lb

## 2015-10-07 DIAGNOSIS — E039 Hypothyroidism, unspecified: Secondary | ICD-10-CM

## 2015-10-07 DIAGNOSIS — J029 Acute pharyngitis, unspecified: Secondary | ICD-10-CM | POA: Diagnosis not present

## 2015-10-07 DIAGNOSIS — I10 Essential (primary) hypertension: Secondary | ICD-10-CM | POA: Diagnosis not present

## 2015-10-07 DIAGNOSIS — Z720 Tobacco use: Secondary | ICD-10-CM

## 2015-10-07 DIAGNOSIS — F17200 Nicotine dependence, unspecified, uncomplicated: Secondary | ICD-10-CM

## 2015-10-07 LAB — COMPREHENSIVE METABOLIC PANEL
ALK PHOS: 178 U/L — AB (ref 39–117)
ALT: 29 U/L (ref 0–53)
AST: 64 U/L — ABNORMAL HIGH (ref 0–37)
Albumin: 3.4 g/dL — ABNORMAL LOW (ref 3.5–5.2)
BILIRUBIN TOTAL: 1 mg/dL (ref 0.2–1.2)
BUN: 11 mg/dL (ref 6–23)
CO2: 29 mEq/L (ref 19–32)
Calcium: 8.4 mg/dL (ref 8.4–10.5)
Chloride: 108 mEq/L (ref 96–112)
Creatinine, Ser: 0.85 mg/dL (ref 0.40–1.50)
GFR: 99.61 mL/min (ref >60.00)
GLUCOSE: 110 mg/dL — AB (ref 70–99)
Potassium: 4.5 mEq/L (ref 3.5–5.1)
SODIUM: 144 meq/L (ref 135–145)
Total Protein: 7.1 g/dL (ref 6.0–8.3)

## 2015-10-07 LAB — CBC
HCT: 37.5 % — ABNORMAL LOW (ref 39.0–52.0)
Hemoglobin: 12.3 g/dL — ABNORMAL LOW (ref 13.0–17.0)
MCHC: 32.8 g/dL (ref 30.0–36.0)
MCV: 93.8 fl (ref 78.0–100.0)
Platelets: 136 10*3/uL — ABNORMAL LOW (ref 150.0–400.0)
RBC: 4 Mil/uL — ABNORMAL LOW (ref 4.22–5.81)
RDW: 21.6 % — ABNORMAL HIGH (ref 11.5–15.5)
WBC: 7.5 10*3/uL (ref 4.0–10.5)

## 2015-10-07 LAB — T4, FREE: FREE T4: 0.74 ng/dL (ref 0.60–1.60)

## 2015-10-07 LAB — TSH: TSH: 23.37 u[IU]/mL — ABNORMAL HIGH (ref 0.35–4.50)

## 2015-10-07 NOTE — Assessment & Plan Note (Signed)
He is not taking his medicine and will simplify to one thyroid medicine only. Checking TSH and free T4. Suspect non-compliance instead of inadequate replacement.

## 2015-10-07 NOTE — Assessment & Plan Note (Signed)
BP at goal today. He would like to come off medicine but not recommended today due to history of stroke it is very important to have good control and talked to him about that as well.

## 2015-10-07 NOTE — Assessment & Plan Note (Signed)
Did talk to him about the fact that chest x-ray is indicated today for his cough and bloody sputum and smoking history but he declines. Last CXR in 2012 and normal.

## 2015-10-07 NOTE — Progress Notes (Signed)
   Subjective:    Patient ID: Jon Boyd, male    DOB: 20-Sep-1961, 54 y.o.   MRN: [id number]  HPI The patient is a 54 YO man coming in for sore throat. Going on for 1 month and getting slowly better with time. Lost his voice for several days. No fevers or chills. Had a cold at the onset and that has cleared but he still has allergy drainage. Denies SOB and still able to work. Some cough which is not very productive. Occasional blood in sputum (over the last several years during episodes of cold). Happened once during this episode (specks, no clots). Denies nosebleeds. Still smoking 1/2 PPD but smoked 2 PPD for some time. Has had ENT evaluation in the past where he states they looked at his vocal cords and they were without finding at that time. No weight change or night sweats. Does not take his medicines daily and feels better without them. Thinks he is on too many medicines.   Review of Systems  Constitutional: Negative for fever, chills, activity change, appetite change, fatigue and unexpected weight change.  HENT: Positive for congestion, postnasal drip, rhinorrhea and sore throat. Negative for ear discharge, ear pain, sinus pressure, trouble swallowing and voice change.   Eyes: Negative.   Respiratory: Positive for cough. Negative for chest tightness, shortness of breath and wheezing.   Cardiovascular: Negative for chest pain, palpitations and leg swelling.  Gastrointestinal: Negative.   Neurological: Negative.       Objective:   Physical Exam  Constitutional: He is oriented to person, place, and time. He appears well-developed and well-nourished.  HENT:  Head: Normocephalic and atraumatic.  Back of throat is red without drainage, voice is suggestive of long smoking history, ears clear.   Eyes: EOM are normal.  Neck: Normal range of motion.  Cardiovascular: Normal rate and regular rhythm.   Pulmonary/Chest: Effort normal and breath sounds normal. No respiratory distress. He has no  wheezes. He has no rales.  Abdominal: Soft. He exhibits no distension. There is no tenderness.  Musculoskeletal: He exhibits no edema.  Neurological: He is alert and oriented to person, place, and time.  Skin: Skin is warm and dry.   Filed Vitals:   10/07/15 0823  BP: 152/90  Pulse: 99  Temp: 98 F (36.7 C)  TempSrc: Oral  Resp: 18  Height: 5\' 11"  (1.803 m)  Weight: 202 lb (91.627 kg)  SpO2: 96%      Assessment & Plan:

## 2015-10-07 NOTE — Patient Instructions (Signed)
We will call you back with the lab results if there are any changes to the medicines.   I suspect that you can go back to taking only the 1 thyroid medicine (the 200 mcg pill) but it is important to take every day.   Call us back in 1 month if the throat is not better as we may have you see the ear nose and throat doctor again to check the vocal cords.   Come back in about 6 months for a physical if you are doing well.

## 2015-10-07 NOTE — Assessment & Plan Note (Signed)
Suspect it is related to his drainage and per his reports it is improving. He does not wish any medicine for his allergies or drainage. If not improved in 1 month he will call and he needs referral to ENT. Given his extensive smoking history would be suspicious for vocal cord problem causing his voice change and sore throat. No weight loss but is having intermittent blood in sputum. Recommended CXR today and he declines.

## 2015-10-07 NOTE — Progress Notes (Signed)
Pre visit review using our clinic review tool, if applicable. No additional management support is needed unless otherwise documented below in the visit note. 

## 2015-10-29 ENCOUNTER — Emergency Department (HOSPITAL_COMMUNITY): Payer: 59

## 2015-10-29 ENCOUNTER — Ambulatory Visit: Payer: BLUE CROSS/BLUE SHIELD | Admitting: Internal Medicine

## 2015-10-29 ENCOUNTER — Telehealth: Payer: Self-pay | Admitting: Internal Medicine

## 2015-10-29 ENCOUNTER — Emergency Department (HOSPITAL_COMMUNITY)
Admission: EM | Admit: 2015-10-29 | Discharge: 2015-10-29 | Disposition: A | Discharge status: Home / Self Care | Payer: 59 | Attending: Emergency Medicine | Admitting: Emergency Medicine

## 2015-10-29 ENCOUNTER — Encounter (HOSPITAL_COMMUNITY): Payer: Self-pay

## 2015-10-29 DIAGNOSIS — Z8673 Personal history of transient ischemic attack (TIA), and cerebral infarction without residual deficits: Secondary | ICD-10-CM | POA: Insufficient documentation

## 2015-10-29 DIAGNOSIS — Z79899 Other long term (current) drug therapy: Secondary | ICD-10-CM | POA: Diagnosis not present

## 2015-10-29 DIAGNOSIS — R079 Chest pain, unspecified: Secondary | ICD-10-CM | POA: Diagnosis present

## 2015-10-29 DIAGNOSIS — K828 Other specified diseases of gallbladder: Secondary | ICD-10-CM | POA: Insufficient documentation

## 2015-10-29 DIAGNOSIS — K7460 Unspecified cirrhosis of liver: Secondary | ICD-10-CM | POA: Insufficient documentation

## 2015-10-29 DIAGNOSIS — R188 Other ascites: Secondary | ICD-10-CM | POA: Insufficient documentation

## 2015-10-29 DIAGNOSIS — E890 Postprocedural hypothyroidism: Secondary | ICD-10-CM | POA: Insufficient documentation

## 2015-10-29 DIAGNOSIS — E0500 Thyrotoxicosis with diffuse goiter without thyrotoxic crisis or storm: Secondary | ICD-10-CM | POA: Diagnosis not present

## 2015-10-29 DIAGNOSIS — G8929 Other chronic pain: Secondary | ICD-10-CM | POA: Diagnosis not present

## 2015-10-29 DIAGNOSIS — I10 Essential (primary) hypertension: Secondary | ICD-10-CM | POA: Insufficient documentation

## 2015-10-29 DIAGNOSIS — F17210 Nicotine dependence, cigarettes, uncomplicated: Secondary | ICD-10-CM | POA: Insufficient documentation

## 2015-10-29 DIAGNOSIS — Z7982 Long term (current) use of aspirin: Secondary | ICD-10-CM | POA: Insufficient documentation

## 2015-10-29 DIAGNOSIS — E785 Hyperlipidemia, unspecified: Secondary | ICD-10-CM | POA: Diagnosis not present

## 2015-10-29 LAB — CBC
HCT: 32.6 % — ABNORMAL LOW (ref 39.0–52.0)
Hemoglobin: 11.2 g/dL — ABNORMAL LOW (ref 13.0–17.0)
MCH: 31.4 pg (ref 26.0–34.0)
MCHC: 34.4 g/dL (ref 30.0–36.0)
MCV: 91.3 fL (ref 78.0–100.0)
PLATELETS: 90 10*3/uL — AB (ref 150–400)
RBC: 3.57 MIL/uL — AB (ref 4.22–5.81)
RDW: 21.3 % — ABNORMAL HIGH (ref 11.5–15.5)
WBC: 7 10*3/uL (ref 4.0–10.5)

## 2015-10-29 LAB — URINALYSIS, ROUTINE W REFLEX MICROSCOPIC
BILIRUBIN URINE: NEGATIVE
Glucose, UA: NEGATIVE mg/dL
HGB URINE DIPSTICK: NEGATIVE
KETONES UR: NEGATIVE mg/dL
Leukocytes, UA: NEGATIVE
NITRITE: NEGATIVE
Protein, ur: NEGATIVE mg/dL
SPECIFIC GRAVITY, URINE: 1.015 (ref 1.005–1.030)
pH: 6 (ref 5.0–8.0)

## 2015-10-29 LAB — COMPREHENSIVE METABOLIC PANEL
ALK PHOS: 217 U/L — AB (ref 38–126)
ALT: 39 U/L (ref 17–63)
AST: 105 U/L — AB (ref 15–41)
Albumin: 3.1 g/dL — ABNORMAL LOW (ref 3.5–5.0)
Anion gap: 9 (ref 5–15)
BILIRUBIN TOTAL: 1.5 mg/dL — AB (ref 0.3–1.2)
BUN: 11 mg/dL (ref 6–20)
CALCIUM: 8.3 mg/dL — AB (ref 8.9–10.3)
CHLORIDE: 108 mmol/L (ref 101–111)
CO2: 27 mmol/L (ref 22–32)
CREATININE: 0.79 mg/dL (ref 0.61–1.24)
Glucose, Bld: 139 mg/dL — ABNORMAL HIGH (ref 65–99)
Potassium: 3.7 mmol/L (ref 3.5–5.1)
Sodium: 144 mmol/L (ref 135–145)
TOTAL PROTEIN: 6.7 g/dL (ref 6.5–8.1)

## 2015-10-29 LAB — I-STAT TROPONIN, ED: Troponin i, poc: 0.01 ng/mL (ref 0.00–0.08)

## 2015-10-29 LAB — PROTIME-INR
INR: 1.24 (ref 0.00–1.49)
PROTHROMBIN TIME: 15.7 s — AB (ref 11.6–15.2)

## 2015-10-29 LAB — LIPASE, BLOOD: LIPASE: 90 U/L — AB (ref 11–51)

## 2015-10-29 LAB — POC OCCULT BLOOD, ED: Fecal Occult Bld: POSITIVE — AB

## 2015-10-29 MED ORDER — IOHEXOL 300 MG/ML  SOLN
150.0000 mL | Freq: Once | INTRAMUSCULAR | Status: AC | PRN
  Administered 2015-10-29: 100 mL via INTRAVENOUS

## 2015-10-29 MED ORDER — HYDROCODONE-ACETAMINOPHEN 5-325 MG PO TABS: 1.0000 | ORAL_TABLET | Freq: Four times a day (QID) | ORAL | Status: DC | PRN

## 2015-10-29 MED ORDER — ONDANSETRON 4 MG PO TBDP
4.0000 mg | ORAL_TABLET | Freq: Once | ORAL | Status: AC
  Administered 2015-10-29: 4 mg via ORAL
  Filled 2015-10-29: qty 1

## 2015-10-29 MED ORDER — OXYCODONE-ACETAMINOPHEN 5-325 MG PO TABS
1.0000 | ORAL_TABLET | Freq: Once | ORAL | Status: AC
Start: 2015-10-29 — End: 2015-10-29
  Administered 2015-10-29: 1 via ORAL
  Filled 2015-10-29: qty 1

## 2015-10-29 NOTE — ED Provider Notes (Signed)
CSN: [id number]     Arrival date & time 10/29/15  [id number] History   First MD Initiated Contact with Patient 10/29/15 1009     Chief Complaint  Patient presents with  . Chest Pain     (Consider location/radiation/quality/duration/timing/severity/associated sxs/prior Treatment) HPI Comments: 54 year old male with past medical history including hypertension, hyperlipidemia, CVA, Graves' disease, colon polyps who presents with epigastric pain. Patient reports that yesterday afternoon he developed epigastric pain that has been constant, moderate in intensity, nonradiating. Nothing makes it better or worse. He denies any associated vomiting or diarrhea. No fevers. He denies any significant NSAID use. He has a history of alcohol use but stopped in June. He states he had 2 beers yesterday but none today. He reports intermittent bloody stools with bright red blood and also dark blood which she has always had intermittently because of his history of multiple colonic polyps. He does report increased fatigue recently.   Patient is a 54 y.o. male presenting with chest pain. The history is provided by the patient.  Chest Pain   Past Medical History  Diagnosis Date  . Hypertension   . Chronic headaches   . Hyperlipidemia   . Stroke ([place])   . Grave's disease 04/2011    s/p I-131 tx 01/2012  . Postablative hypothyroidism   . Edema 06/03/2014   Past Surgical History  Procedure Laterality Date  . Back surgery    . Ankle surgery    . Finger surgery  09/2012    gramig  . Colonoscopy    . Polypectomy     Family History  Problem Relation Age of Onset  . COPD Mother   . Tremor Mother   . Hyperthyroidism Sister   . Colon cancer Father 72  . Colon polyps Father    Social History  Substance Use Topics  . Smoking status: Current Every Day Smoker -- 0.50 packs/day for 35 years    Types: Cigarettes  . Smokeless tobacco: Never Used  . Alcohol Use: No     Comment: dr has made him quit since 04-08-15,  pt  admitted to drinking on 10/29/15    Review of Systems  Cardiovascular: Positive for chest pain.   10 Systems reviewed and are negative for acute change except as noted in the HPI.   Allergies  Review of patient's allergies indicates no known allergies.  Home Medications   Prior to Admission medications   Medication Sig Start Date End Date Taking? Authorizing Provider  aspirin 81 MG chewable tablet Chew 81 mg by mouth daily.     Yes Historical Provider, MD  atorvastatin (LIPITOR) 20 MG tablet Take 1 tablet (20 mg total) by mouth daily at 6 PM. 08/26/15  Yes Rowe Clack, MD  levothyroxine (LEVOTHROID) 25 MCG tablet Take 1 tablet (25 mcg total) by mouth daily before breakfast. Take with 29mcg for total 241mcg daily 04/08/15  Yes Rowe Clack, MD  levothyroxine (SYNTHROID, LEVOTHROID) 200 MCG tablet Take 1 tablet (200 mcg total) by mouth daily before breakfast. 04/07/15  Yes Rowe Clack, MD  losartan (COZAAR) 50 MG tablet Take 1 tablet (50 mg total) by mouth daily. 08/26/15  Yes Rowe Clack, MD  metoprolol succinate (TOPROL-XL) 25 MG 24 hr tablet Take 1 tablet (25 mg total) by mouth daily. 10/02/14  Yes Rowe Clack, MD  sildenafil (VIAGRA) 100 MG tablet Take 0.5-1 tablets (50-100 mg total) by mouth daily as needed for erectile dysfunction. 10/02/14  Yes Rowe Clack, MD  HYDROcodone-acetaminophen (NORCO/VICODIN) 5-325 MG tablet Take 1 tablet by mouth every 6 (six) hours as needed for severe pain. 10/29/15   Wenda Overland Little, MD   BP 119/78 mmHg  Pulse 81  Temp(Src) 98.3 F (36.8 C) (Oral)  Resp 20  SpO2 98% Physical Exam  Constitutional: He is oriented to person, place, and time. He appears well-developed and well-nourished. No distress.  Appears older than stated age  HENT:  Head: Normocephalic and atraumatic.  Moist mucous membranes  Eyes: Conjunctivae are normal. Pupils are equal, round, and reactive to light.  Neck: Neck supple.   Cardiovascular: Normal rate, regular rhythm and normal heart sounds.   No murmur heard. Pulmonary/Chest: Effort normal and breath sounds normal.  Abdominal: Soft. Bowel sounds are normal. He exhibits no distension.  Epigastric TTP w/ no rebound or guarding  Musculoskeletal: He exhibits no edema.  Neurological: He is alert and oriented to person, place, and time.  Fluent speech  Skin: Skin is warm and dry.  Psychiatric: He has a normal mood and affect. Judgment normal.  Nursing note and vitals reviewed.   ED Course  Procedures (including critical care time) Labs Review Labs Reviewed  CBC - Abnormal; Notable for the following:    RBC 3.57 (*)    Hemoglobin 11.2 (*)    HCT 32.6 (*)    RDW 21.3 (*)    Platelets 90 (*)    All other components within normal limits  LIPASE, BLOOD - Abnormal; Notable for the following:    Lipase 90 (*)    All other components within normal limits  COMPREHENSIVE METABOLIC PANEL - Abnormal; Notable for the following:    Glucose, Bld 139 (*)    Calcium 8.3 (*)    Albumin 3.1 (*)    AST 105 (*)    Alkaline Phosphatase 217 (*)    Total Bilirubin 1.5 (*)    All other components within normal limits  PROTIME-INR - Abnormal; Notable for the following:    Prothrombin Time 15.7 (*)    All other components within normal limits  POC OCCULT BLOOD, ED - Abnormal; Notable for the following:    Fecal Occult Bld POSITIVE (*)    All other components within normal limits  URINALYSIS, ROUTINE W REFLEX MICROSCOPIC (NOT AT [facility])  Randolm Idol, ED    Imaging Review US Abdomen Complete  10/29/2015  CLINICAL DATA:  Mid abdominal pain since yesterday. EXAM: ABDOMEN ULTRASOUND COMPLETE COMPARISON:  02/07/2014 FINDINGS: Gallbladder: Moderate distention of the gallbladder with asymmetric wall thickening. Gallbladder wall measures up to 1.1 cm 2 at the fundus. There are cystic type structures along the gallbladder wall and/or pericholecystic fluid. Echogenic material  in the gallbladder could represent stones or sludge. Reportedly, the patient has a sonographic Murphy's sign. Common bile duct: Diameter: 0.6 cm. Liver: Liver is mildly heterogeneous without a focal lesion. Small amount of perihepatic fluid. Main portal vein is patent with hepatopetal flow. IVC: No abnormality visualized. Pancreas: Visualized portion unremarkable. Spleen: Size and appearance within normal limits. Right Kidney: Length: 13.0 cm. Echogenicity within normal limits. No mass or hydronephrosis visualized. Left Kidney: Length: 13.8. Left kidney is poorly visualized due to bowel gas. No evidence for hydronephrosis. Abdominal aorta: No aneurysm visualized. Other findings: Small amount of ascites in the right lower quadrant and left lower quadrant. IMPRESSION: Gallbladder is markedly abnormal. The gallbladder is distended with asymmetric wall thickening. Fluid or cystic-type structures around the gallbladder wall. Probable sludge or gallstones. In addition, there is small amount of  ascites in the abdomen. Findings are concerning for acute cholecystitis. Recommend further evaluation with a CT of the abdomen/pelvis to better characterize the gallbladder disease and exclude a neoplastic process. Abdominal ascites could be secondary to gallbladder disease. The liver is mildly heterogeneous and difficult to exclude early cirrhotic changes. These findings could also be further characterized with CT. These results were called by telephone at the time of interpretation on 10/29/2015 at 1:43 pm to Dr. Theotis Burrow , who verbally acknowledged these results. Electronically Signed   By: Markus Daft M.D.   On: 10/29/2015 13:45   Ct Abdomen Pelvis W Contrast  10/29/2015  CLINICAL DATA:  Abdominal pain and shortness of breath. Abnormal gallbladder on ultrasound exam. Ascites. EXAM: CT ABDOMEN AND PELVIS WITH CONTRAST TECHNIQUE: Multidetector CT imaging of the abdomen and pelvis was performed using the standard protocol  following bolus administration of intravenous contrast. CONTRAST:  140mL OMNIPAQUE IOHEXOL 300 MG/ML  SOLN COMPARISON:  Ultrasound dated 10/29/2015 FINDINGS: Lower chest:  Normal. Hepatobiliary: There is nodularity of the liver parenchyma prominence of the caudate lobe. This is consistent with cirrhosis. There are multiple small stones in the gallbladder. The gallbladder wall is thickened. There is an enhancing 16 mm nodule at the tip of the gallbladder worrisome for carcinoma. There is an adjacent calcification in the wall of the gallbladder. There are no dilated bile ducts. Pancreas: Pancreatic parenchyma is normal. Slight haziness in the peripancreatic fat suggests mild pancreatitis. Spleen: The spleen is at the upper limits of normal. There multiple varices in the left upper quadrant which extend around the gastroesophageal junction. This is suggestive of portal hypertension. Adrenals/Urinary Tract: 21 mm low-density lesion on the left adrenal gland consistent with a benign adrenal adenoma. Kidneys, ureters, and bladder are normal. Stomach/Bowel: There is slight mucosal edema in the rectum and in the cecum and ascending colon. The appendix and small bowel are normal. Vascular/Lymphatic: Extensive calcification in the abdominal aorta and iliac arteries. Reproductive: Normal. Other: Ascites primarily around the liver and spleen. Soft tissue edema in the mesenteries. Musculoskeletal: No acute abnormality. Degenerative disc disease in the lower lumbar spine. IMPRESSION: 1. Cirrhosis with ascites. 2. Cholelithiasis. 3. Focal enhancing nodule at the tip of the gallbladder worrisome for carcinoma. 4. Upper abdominal varices consistent with portal hypertension. 5. Aortic atherosclerosis. Electronically Signed   By: Lorriane Shire M.D.   On: 10/29/2015 14:56   I have personally reviewed and evaluated these lab results as part of my medical decision-making.   EKG Interpretation   Date/Time:  Wednesday October 29 2015 09:37:53 EST Ventricular Rate:  97 PR Interval:  128 QRS Duration: 85 QT Interval:  379 QTC Calculation: 481 R Axis:   49 Text Interpretation:  Sinus rhythm Borderline prolonged QT interval No  previous ECGs available Confirmed by LITTLE MD, RACHEL [phone]) on  10/29/2015 10:24:46 AM      MDM   Final diagnoses:  Cirrhosis of liver with ascites, unspecified hepatic cirrhosis type ([place])  Gallbladder mass    Pt w/ 1 day of epigastric pain that he has never had before. On exam, pt was mildly uncomfortable but NAD. VS notable for hypertension. Gastric tenderness on exam with no rebound or guarding. Obtained above lab work. EKG at triage showed sinus rhythm with no ischemic changes. Labs showed mild anemia, low platelets at 90,000, lipase mildly elevated at 90, total bilirubin 1.5, alkaline phosphatase 217, AST 105. Because of these lab abnormalities, obtained an ultrasound of the abdomen to evaluate for gallbladder  pathology. Ultrasound showed markedly abnormal gallbladder with ascites noted. Per radiology recommendation, obtained a CT of the abdomen and pelvis for better evaluation. CT showed cirrhosis with ascites and abdominal varices consistent with portal hypertension. Nodule at tip of gallbladder also noted, concerning for carcinoma. I discussed with general surgery, Dr. Lucia Gaskins, via his assistance [place]. He reviewed images and recommended primary care provider follow-up for outpatient workup as gallbladder abnormalities are likely secondary to ascites rather than cholecystitis. I have instructed patient to follow-up with his PCP as well as his gastroenterologist at [facility]. Gave vicodin and cautioned on careful use given tylenol and liver disease. The size importance of discontinuing all alcohol use. On reexamination, the patient is well-appearing with no complaints of severe pain and has not required any pain medications here. Return precautions reviewed and patient discharged in  satisfactory condition.   Sharlett Iles, MD 10/29/15 [id number]

## 2015-10-29 NOTE — ED Notes (Signed)
US at bedside

## 2015-10-29 NOTE — ED Notes (Signed)
Pt escorted to discharge window. Pt verbalized understanding discharge instructions. In no acute distress.  

## 2015-10-29 NOTE — Telephone Encounter (Signed)
PLEASE NOTE: All timestamps contained within this report are represented as [place] Standard Time. CONFIDENTIALTY NOTICE: This fax transmission is intended only for the addressee. It contains information that is legally privileged, confidential or otherwise protected from use or disclosure. If you are not the intended recipient, you are strictly prohibited from reviewing, disclosing, copying using or disseminating any of this information or taking any action in reliance on or regarding this information. If you have received this fax in error, please notify us immediately by telephone so that we can arrange for its return to [place]. Phone: [phone], Toll-Free: [phone], Fax: [phone] Page: 1 of 2 Call Id: [id number] [facility] - Client [facility] Patient Name: Jon Boyd Gender: Male DOB: Jan 23, 1961 Age: 54 Y 42 M 4 D Return Phone Number: [phone] (Primary) Address: City/State/Zip: [place] Client [facility] - Client Client Site [facility] - Day Physician [place], [place] Type Call Call Type Triage / Clinical Relationship To Patient Self Appointment Disposition EMR Appointment Attempted - Not Scheduled Info pasted into Epic Yes Return Phone Number [phone] (Primary) Chief Complaint CHEST PAIN (>=21 years) - pain, pressure, heaviness or tightness Initial Comment Caller states he has chest pain and difficulty breathing PreDisposition Call Doctor Nurse Assessment Nurse: Mechele Dawley, RN, Amy Date/Time Eilene Ghazi Time): 10/29/2015 11:12:19 AM Confirm and document reason for call. If symptomatic, describe symptoms. ------CHEST PAIN AND DIFFICULTY BREATHING, SYMPTOMS STARTED A DAY AGO. HE HAD A STROKE ABOUT 5 YEARS AGO. HE HAS NEVER ANYTHING LIKE THIS BEFORE AGO. MIDDLE PART OF CHEST FEELS LIKE ITS IN KNOTS. CONSTANTLY. DIFFICULTY BREATHING WITH LAYING IN THE BED. HE  STATES THAT HE WILL DRIVE HIMSELF TO THE OFFICE TO SEE THE MD THERE. HE REFUSES TO LET THE NURSE SPEAK TO ANYONE IN THE HOME WITH HIM. Has the patient traveled out of the country within the last 30 days? ---Not Applicable Does the patient have any new or worsening symptoms? ---Yes Will a triage be completed? ---Yes Related visit to physician within the last 2 weeks? ---No Does the PT have any chronic conditions? (i.e. diabetes, asthma, etc.) ---Yes List chronic conditions. ---stroke - please see epic records Is this a behavioral health or substance abuse call? ---No Guidelines Guideline Title Affirmed Question Affirmed Notes Nurse Date/Time (Eastern Time) Chest Pain [1] Chest pain lasts > 5 minutes AND [2] age > 72 [place], RN, Amy 10/29/2015 11:21:54 AM PLEASE NOTE: All timestamps contained within this report are represented as [place] Standard Time. CONFIDENTIALTY NOTICE: This fax transmission is intended only for the addressee. It contains information that is legally privileged, confidential or otherwise protected from use or disclosure. If you are not the intended recipient, you are strictly prohibited from reviewing, disclosing, copying using or disseminating any of this information or taking any action in reliance on or regarding this information. If you have received this fax in error, please notify us immediately by telephone so that we can arrange for its return to [place]. Phone: [phone], Toll-Free: [phone], Fax: [phone] Page: 2 of 2 Call Id: [id number] [place]. Time Eilene Ghazi Time) Disposition Final User 10/29/2015 8:20:19 AM Send to Urgent Queue Dalia Heading 10/29/2015 8:59:06 AM 911 Outcome Documentation [place], Therapist, sports, Amy Reason: CALLED AND SPOKE WITH STEPHANIE IN THE OFFICE AND SHE STATES THAT THE OFFICE HAS NOT APPT'S. PATIENT WILL NEED TO GO TO THE ED OR URGENT CARE. CALLED THE PATIENT BACK AND INFORMED HIM OF THIS INFORMATION. 10/29/2015 8:59:30 AM Send  To RN  Personal [place], RN, Amy 10/29/2015 8:59:47 AM Call Completed Mechele Dawley, RN, Amy 10/29/2015 11:23:57 AM 911 Outcome Documentation [place], RN, Amy Reason: spoke with stephanie in the office - no appts available today. instructed that the pt needs to go into ed or urgent care. called the patient back and instructed on this information. 10/29/2015 11:22:27 AM Call EMS 911 Now Yes Mechele Dawley, RN, Amy Caller Understands: Yes Disagree/Comply: Disagree Disagree/Comply Reason: Wait and see Care Advice Given Per Guideline CALL EMS 911 NOW: Immediate medical attention is needed. You need to hang up and call 911 (or an ambulance). Psychologist, forensic Discretion: I'll call you back in a few minutes to be sure you were able to reach them.) CARE ADVICE given per Chest Pain (Adult) guideline. After Care Instructions Given Call Event Type User Date / Time Description Comments User: Susanne Borders, RN Date/Time Eilene Ghazi Time): 10/29/2015 11:24:31 AM LATE ENTRY DOCUMENTATION FROM THIS MORNING AT 0848 Referrals REFERRED TO PCP OFFICE

## 2015-10-29 NOTE — ED Notes (Signed)
Pt alert and oriented x4. Respirations even and unlabored, bilateral symmetrical rise and fall of chest. Skin warm and dry. In no acute distress. Denies needs.   

## 2015-10-29 NOTE — ED Notes (Signed)
Bed: WA13 Expected date:  Expected time:  Means of arrival:  Comments: Triage 3 

## 2015-10-29 NOTE — Discharge Instructions (Signed)
Cirrhosis °Cirrhosis is long-term (chronic) liver injury. The liver is your largest internal organ, and it performs many functions. The liver converts food into energy, removes toxic material from your blood, makes important proteins, and absorbs necessary vitamins from your diet. °If you have cirrhosis, it means many of your healthy liver cells have been replaced by scar tissue. This prevents blood from flowing through your liver, which makes it difficult for your liver to function. This scarring is not reversible, but treatment can prevent it from getting worse.  °CAUSES  °Hepatitis C and long-term alcohol abuse are the most common causes of cirrhosis. Other causes include: °· Nonalcoholic fatty liver disease. °· Hepatitis B infection. °· Autoimmune hepatitis. °· Diseases that cause blockage of ducts inside the liver. °· Inherited liver diseases. °· Reactions to certain long-term medicines. °· Parasitic infections. °· Long-term exposure to certain toxins. °RISK FACTORS °You may have a higher risk of cirrhosis if you: °· Have certain hepatitis viruses. °· Abuse alcohol, especially if you are male. °· Are overweight. °· Share needles. °· Have unprotected sex with someone who has hepatitis. °SYMPTOMS  °You may not have any signs and symptoms at first. Symptoms may not develop until the damage to your liver starts to get worse. Signs and symptoms of cirrhosis may include:  °· Tenderness in the right-upper part of your abdomen. °· Weakness and tiredness (fatigue). °· Loss of appetite. °· Nausea. °· Weight loss and muscle loss. °· Itchiness. °· Yellow skin and eyes (jaundice). °· Buildup of fluid in the abdomen (ascites). °· Swelling of the feet and ankles (edema). °· Appearance of tiny blood vessels under the skin. °· Mental confusion. °· Easy bruising and bleeding. °DIAGNOSIS  °Your health care provider may suspect cirrhosis based on your symptoms and medical history, especially if you have other medical conditions  or a history of alcohol abuse. Your health care provider will do a physical exam to feel your liver and check for signs of cirrhosis. Your health care provider may perform other tests, including:  °· Blood tests to check:   °¨ Whether you have hepatitis B or C.   °¨ Kidney function. °¨ Liver function. °· Imaging tests such as: °¨ MRI or CT scan to look for changes seen in advanced cirrhosis. °¨ Ultrasound to see if normal liver tissue is being replaced by scar tissue. °· A procedure using a long needle to take a sample of liver tissue (biopsy) for examination under a microscope. Liver biopsy can confirm the diagnosis of cirrhosis.   °TREATMENT  °Treatment depends on how damaged your liver is and what caused the damage. Treatment may include treating cirrhosis symptoms or treating the underlying causes of the condition to try to slow the progression of the damage. Treatment may include: °· Making lifestyle changes, such as:   °¨ Eating a healthy diet. °¨ Restricting salt intake.  °¨ Maintaining a healthy weight.   °¨ Not abusing drugs or alcohol. °· Taking medicines to: °¨ Treat liver infections or other infections. °¨ Control itching. °¨ Reduce fluid buildup. °¨ Reduce certain blood toxins. °¨ Reduce risk of bleeding from enlarged blood vessels in the stomach or esophagus (varices). °· If varices are causing bleeding problems, you may need treatment with a procedure that ties up the vessels causing them to fall off (band ligation). °· If cirrhosis is causing your liver to fail, your health care provider may recommend a liver transplant. °· Other treatments may be recommended depending on any complications of cirrhosis, such as liver-related kidney failure (hepatorenal   syndrome). HOME CARE INSTRUCTIONS   Take medicines only as directed by your health care provider. Do not use drugs that are toxic to your liver. Ask your health care provider before taking any new medicines, including over-the-counter medicines.    Rest as needed.  Eat a well-balanced diet. Ask your health care provider or dietitian for more information.   You may have to follow a low-salt diet or restrict your water intake as directed.  Do not drink alcohol. This is especially important if you are taking acetaminophen.  Keep all follow-up visits as directed by your health care provider. This is important. SEEK MEDICAL CARE IF:  You have fatigue or weakness that is getting worse.  You develop swelling of the hands, feet, legs, or face.  You have a fever.  You develop loss of appetite.  You have nausea or vomiting.  You develop jaundice.  You develop easy bruising or bleeding. SEEK IMMEDIATE MEDICAL CARE IF:  You vomit bright red blood or a material that looks like coffee grounds.  You have blood in your stools.  Your stools appear black and tarry.  You become confused.  You have chest pain or trouble breathing.   This information is not intended to replace advice given to you by your health care provider. Make sure you discuss any questions you have with your health care provider.   Document Released: 10/18/2005 Document Revised: 11/08/2014 Document Reviewed: 06/26/2014 Elsevier Interactive Patient Education 2016 [place] CT SCAN SHOWED A MASS ON YOUR GALLBLADDER. IT IS VERY IMPORTANT TO FOLLOW CLOSELY WITH YOUR PRIMARY CARE DOCTOR AND GASTROENTEROLOGIST FOR FURTHER WORK UP.

## 2015-10-29 NOTE — ED Notes (Signed)
He c/o pain at the area where lower chest meets epigastrium since yesterday afternoon. He also c/o some shortness of breath.  EKG/labs performed at triage.

## 2015-10-30 ENCOUNTER — Telehealth: Payer: Self-pay | Admitting: Internal Medicine

## 2015-10-30 DIAGNOSIS — K7460 Unspecified cirrhosis of liver: Secondary | ICD-10-CM

## 2015-10-30 NOTE — Telephone Encounter (Signed)
Referral placed.

## 2015-10-30 NOTE — Telephone Encounter (Signed)
Pt went to ER and they wanted him to follow up with his GI doctor and they are needing a referral. Please give pt a call at [phone]

## 2015-10-31 ENCOUNTER — Encounter: Payer: Self-pay | Admitting: Gastroenterology

## 2015-11-05 ENCOUNTER — Encounter: Payer: Self-pay | Admitting: Internal Medicine

## 2015-11-05 ENCOUNTER — Ambulatory Visit (INDEPENDENT_AMBULATORY_CARE_PROVIDER_SITE_OTHER): Payer: PRIVATE HEALTH INSURANCE | Admitting: Internal Medicine

## 2015-11-05 ENCOUNTER — Other Ambulatory Visit (INDEPENDENT_AMBULATORY_CARE_PROVIDER_SITE_OTHER): Payer: PRIVATE HEALTH INSURANCE

## 2015-11-05 VITALS — BP 100/60 | HR 69 | Temp 98.1°F | Resp 18 | Ht 71.0 in | Wt 196.4 lb

## 2015-11-05 DIAGNOSIS — K7290 Hepatic failure, unspecified without coma: Secondary | ICD-10-CM | POA: Diagnosis not present

## 2015-11-05 DIAGNOSIS — K828 Other specified diseases of gallbladder: Secondary | ICD-10-CM

## 2015-11-05 DIAGNOSIS — D510 Vitamin B12 deficiency anemia due to intrinsic factor deficiency: Secondary | ICD-10-CM | POA: Diagnosis not present

## 2015-11-05 DIAGNOSIS — K7682 Hepatic encephalopathy: Secondary | ICD-10-CM

## 2015-11-05 DIAGNOSIS — R739 Hyperglycemia, unspecified: Secondary | ICD-10-CM | POA: Diagnosis not present

## 2015-11-05 DIAGNOSIS — K7031 Alcoholic cirrhosis of liver with ascites: Secondary | ICD-10-CM | POA: Diagnosis not present

## 2015-11-05 DIAGNOSIS — I10 Essential (primary) hypertension: Secondary | ICD-10-CM

## 2015-11-05 DIAGNOSIS — E038 Other specified hypothyroidism: Secondary | ICD-10-CM

## 2015-11-05 LAB — CBC WITH DIFFERENTIAL/PLATELET
BASOS PCT: 0.4 % (ref 0.0–3.0)
Basophils Absolute: 0 10*3/uL (ref 0.0–0.1)
EOS PCT: 2.7 % (ref 0.0–5.0)
Eosinophils Absolute: 0.2 10*3/uL (ref 0.0–0.7)
HCT: 35.8 % — ABNORMAL LOW (ref 39.0–52.0)
Hemoglobin: 11.9 g/dL — ABNORMAL LOW (ref 13.0–17.0)
LYMPHS ABS: 2.6 10*3/uL (ref 0.7–4.0)
Lymphocytes Relative: 32.7 % (ref 12.0–46.0)
MCHC: 33.2 g/dL (ref 30.0–36.0)
MCV: 95.9 fl (ref 78.0–100.0)
MONO ABS: 1.3 10*3/uL — AB (ref 0.1–1.0)
Monocytes Relative: 16.6 % — ABNORMAL HIGH (ref 3.0–12.0)
NEUTROS PCT: 47.6 % (ref 43.0–77.0)
Neutro Abs: 3.9 10*3/uL (ref 1.4–7.7)
Platelets: 125 10*3/uL — ABNORMAL LOW (ref 150.0–400.0)
RBC: 3.73 Mil/uL — AB (ref 4.22–5.81)
RDW: 24.3 % — AB (ref 11.5–15.5)
WBC: 8.1 10*3/uL (ref 4.0–10.5)

## 2015-11-05 LAB — VITAMIN B12: Vitamin B-12: 515 pg/mL (ref 211–911)

## 2015-11-05 LAB — TSH: TSH: 22.58 u[IU]/mL — AB (ref 0.35–4.50)

## 2015-11-05 LAB — COMPREHENSIVE METABOLIC PANEL
ALK PHOS: 157 U/L — AB (ref 39–117)
ALT: 30 U/L (ref 0–53)
AST: 49 U/L — ABNORMAL HIGH (ref 0–37)
Albumin: 3.1 g/dL — ABNORMAL LOW (ref 3.5–5.2)
BILIRUBIN TOTAL: 1.7 mg/dL — AB (ref 0.2–1.2)
BUN: 18 mg/dL (ref 6–23)
CO2: 28 mEq/L (ref 19–32)
CREATININE: 1.35 mg/dL (ref 0.40–1.50)
Calcium: 8.9 mg/dL (ref 8.4–10.5)
Chloride: 103 mEq/L (ref 96–112)
GFR: 58.38 mL/min — AB (ref >60.00)
Glucose, Bld: 66 mg/dL — ABNORMAL LOW (ref 70–99)
POTASSIUM: 3.7 meq/L (ref 3.5–5.1)
Sodium: 138 mEq/L (ref 135–145)
Total Protein: 6.6 g/dL (ref 6.0–8.3)

## 2015-11-05 LAB — PROTIME-INR
INR: 1.4 ratio — ABNORMAL HIGH (ref 0.8–1.0)
PROTHROMBIN TIME: 14.3 s — AB (ref 9.6–13.1)

## 2015-11-05 LAB — HEMOGLOBIN A1C: HEMOGLOBIN A1C: 5.6 % (ref 4.6–6.5)

## 2015-11-05 LAB — IBC PANEL
Iron: 45 ug/dL (ref 42–165)
Saturation Ratios: 12.6 % — ABNORMAL LOW (ref 20.0–50.0)
Transferrin: 255 mg/dL (ref 212.0–360.0)

## 2015-11-05 LAB — FOLATE: Folate: 14.7 ng/mL (ref >5.9)

## 2015-11-05 LAB — FERRITIN: FERRITIN: 83.5 ng/mL (ref 22.0–322.0)

## 2015-11-05 MED ORDER — LEVOTHYROXINE SODIUM 25 MCG PO TABS: 25.0000 ug | ORAL_TABLET | Freq: Every day | ORAL | Status: DC

## 2015-11-05 MED ORDER — LACTULOSE 10 GM/15ML PO SOLN: 10.0000 g | Freq: Three times a day (TID) | ORAL | Status: DC

## 2015-11-05 NOTE — Progress Notes (Signed)
Pre visit review using our clinic review tool, if applicable. No additional management support is needed unless otherwise documented below in the visit note. 

## 2015-11-05 NOTE — Patient Instructions (Signed)
Cirrhosis °Cirrhosis is long-term (chronic) liver injury. The liver is your largest internal organ, and it performs many functions. The liver converts food into energy, removes toxic material from your blood, makes important proteins, and absorbs necessary vitamins from your diet. °If you have cirrhosis, it means many of your healthy liver cells have been replaced by scar tissue. This prevents blood from flowing through your liver, which makes it difficult for your liver to function. This scarring is not reversible, but treatment can prevent it from getting worse.  °CAUSES  °Hepatitis C and long-term alcohol abuse are the most common causes of cirrhosis. Other causes include: °· Nonalcoholic fatty liver disease. °· Hepatitis B infection. °· Autoimmune hepatitis. °· Diseases that cause blockage of ducts inside the liver. °· Inherited liver diseases. °· Reactions to certain long-term medicines. °· Parasitic infections. °· Long-term exposure to certain toxins. °RISK FACTORS °You may have a higher risk of cirrhosis if you: °· Have certain hepatitis viruses. °· Abuse alcohol, especially if you are male. °· Are overweight. °· Share needles. °· Have unprotected sex with someone who has hepatitis. °SYMPTOMS  °You may not have any signs and symptoms at first. Symptoms may not develop until the damage to your liver starts to get worse. Signs and symptoms of cirrhosis may include:  °· Tenderness in the right-upper part of your abdomen. °· Weakness and tiredness (fatigue). °· Loss of appetite. °· Nausea. °· Weight loss and muscle loss. °· Itchiness. °· Yellow skin and eyes (jaundice). °· Buildup of fluid in the abdomen (ascites). °· Swelling of the feet and ankles (edema). °· Appearance of tiny blood vessels under the skin. °· Mental confusion. °· Easy bruising and bleeding. °DIAGNOSIS  °Your health care provider may suspect cirrhosis based on your symptoms and medical history, especially if you have other medical conditions  or a history of alcohol abuse. Your health care provider will do a physical exam to feel your liver and check for signs of cirrhosis. Your health care provider may perform other tests, including:  °· Blood tests to check:   °¨ Whether you have hepatitis B or C.   °¨ Kidney function. °¨ Liver function. °· Imaging tests such as: °¨ MRI or CT scan to look for changes seen in advanced cirrhosis. °¨ Ultrasound to see if normal liver tissue is being replaced by scar tissue. °· A procedure using a long needle to take a sample of liver tissue (biopsy) for examination under a microscope. Liver biopsy can confirm the diagnosis of cirrhosis.   °TREATMENT  °Treatment depends on how damaged your liver is and what caused the damage. Treatment may include treating cirrhosis symptoms or treating the underlying causes of the condition to try to slow the progression of the damage. Treatment may include: °· Making lifestyle changes, such as:   °¨ Eating a healthy diet. °¨ Restricting salt intake.  °¨ Maintaining a healthy weight.   °¨ Not abusing drugs or alcohol. °· Taking medicines to: °¨ Treat liver infections or other infections. °¨ Control itching. °¨ Reduce fluid buildup. °¨ Reduce certain blood toxins. °¨ Reduce risk of bleeding from enlarged blood vessels in the stomach or esophagus (varices). °· If varices are causing bleeding problems, you may need treatment with a procedure that ties up the vessels causing them to fall off (band ligation). °· If cirrhosis is causing your liver to fail, your health care provider may recommend a liver transplant. °· Other treatments may be recommended depending on any complications of cirrhosis, such as liver-related kidney failure (hepatorenal   syndrome). °HOME CARE INSTRUCTIONS  °· Take medicines only as directed by your health care provider. Do not use drugs that are toxic to your liver. Ask your health care provider before taking any new medicines, including over-the-counter medicines.    °· Rest as needed. °· Eat a well-balanced diet. Ask your health care provider or dietitian for more information.   °· You may have to follow a low-salt diet or restrict your water intake as directed. °· Do not drink alcohol. This is especially important if you are taking acetaminophen. °· Keep all follow-up visits as directed by your health care provider. This is important. °SEEK MEDICAL CARE IF: °· You have fatigue or weakness that is getting worse. °· You develop swelling of the hands, feet, legs, or face. °· You have a fever. °· You develop loss of appetite. °· You have nausea or vomiting. °· You develop jaundice. °· You develop easy bruising or bleeding. °SEEK IMMEDIATE MEDICAL CARE IF: °· You vomit bright red blood or a material that looks like coffee grounds. °· You have blood in your stools. °· Your stools appear black and tarry. °· You become confused. °· You have chest pain or trouble breathing. °  °This information is not intended to replace advice given to you by your health care provider. Make sure you discuss any questions you have with your health care provider. °  °Document Released: 10/18/2005 Document Revised: 11/08/2014 Document Reviewed: 06/26/2014 °Elsevier Interactive Patient Education ©2016 [employer] ° °

## 2015-11-05 NOTE — Progress Notes (Signed)
Subjective:  Patient ID: LANGFORD TYGART, male    DOB: 1961-02-09  Age: 55 y.o. MRN: [id number]  CC: Acute Visit and Anemia  NEW TO ME   HPI HOSSEIN ETRIS presents for a hospital follow-up. He was recently diagnosed with cirrhosis and tells me that he thinks it's related to heavy alcohol intake for years. He complains of hallucinations and constipation today.  Outpatient Prescriptions Prior to Visit  Medication Sig Dispense Refill  . aspirin 81 MG chewable tablet Chew 81 mg by mouth daily.      Marland Kitchen atorvastatin (LIPITOR) 20 MG tablet Take 1 tablet (20 mg total) by mouth daily at 6 PM. 90 tablet 3  . levothyroxine (SYNTHROID, LEVOTHROID) 200 MCG tablet Take 1 tablet (200 mcg total) by mouth daily before breakfast. 90 tablet 4  . losartan (COZAAR) 50 MG tablet Take 1 tablet (50 mg total) by mouth daily. 90 tablet 3  . metoprolol succinate (TOPROL-XL) 25 MG 24 hr tablet Take 1 tablet (25 mg total) by mouth daily. 90 tablet 3  . sildenafil (VIAGRA) 100 MG tablet Take 0.5-1 tablets (50-100 mg total) by mouth daily as needed for erectile dysfunction. 10 tablet 3  . HYDROcodone-acetaminophen (NORCO/VICODIN) 5-325 MG tablet Take 1 tablet by mouth every 6 (six) hours as needed for severe pain. 15 tablet 0  . levothyroxine (LEVOTHROID) 25 MCG tablet Take 1 tablet (25 mcg total) by mouth daily before breakfast. Take with 230mcg for total 233mcg daily (Patient not taking: Reported on 11/05/2015) 90 tablet 3   No facility-administered medications prior to visit.    ROS Review of Systems  Constitutional: Positive for activity change. Negative for fever, chills, diaphoresis, appetite change, fatigue and unexpected weight change.  HENT: Negative.   Eyes: Negative.   Respiratory: Negative.  Negative for cough, choking, chest tightness, shortness of breath and stridor.   Cardiovascular: Negative.  Negative for chest pain, palpitations and leg swelling.  Gastrointestinal: Positive for constipation, blood in  stool and anal bleeding. Negative for nausea, vomiting, abdominal pain, diarrhea, abdominal distention and rectal pain.       He tells me that he has had blood in his stool for years and has had several colonoscopies that were unremarkable  Endocrine: Negative.   Genitourinary: Negative.   Musculoskeletal: Positive for gait problem.  Skin: Negative.  Negative for color change, pallor, rash and wound.  Allergic/Immunologic: Negative.   Neurological: Positive for tremors. Negative for dizziness, speech difficulty, weakness, light-headedness, numbness and headaches.       He is felt off-balance of the last few weeks  Hematological: Negative.  Negative for adenopathy. Does not bruise/bleed easily.  Psychiatric/Behavioral: Positive for hallucinations. Negative for suicidal ideas, behavioral problems, confusion, sleep disturbance, self-injury, dysphoric mood, decreased concentration and agitation. The patient is not nervous/anxious and is not hyperactive.        He complains of visual hallucinations for about 3 weeks, he tells me that he sees dogs and people where they are not    Objective:  BP 100/60 mmHg  Pulse 69  Temp(Src) 98.1 F (36.7 C) (Oral)  Resp 18  Ht 5\' 11"  (1.803 m)  Wt 196 lb 6.4 oz (89.086 kg)  BMI 27.40 kg/m2  SpO2 98%  BP Readings from Last 3 Encounters:  11/05/15 100/60  10/29/15 150/87  10/07/15 138/82    Wt Readings from Last 3 Encounters:  11/05/15 196 lb 6.4 oz (89.086 kg)  10/07/15 202 lb (91.627 kg)  04/24/15 199 lb (90.266 kg)  Physical Exam  Constitutional: He is oriented to person, place, and time. Vital signs are normal.  Non-toxic appearance. He has a sickly appearance. He does not appear ill. No distress.  He appears older than his stated age  HENT:  Mouth/Throat: Oropharynx is clear and moist. No oropharyngeal exudate.  Eyes: Conjunctivae are normal. Right eye exhibits no discharge. Left eye exhibits no discharge. No scleral icterus.  Neck:  Normal range of motion. Neck supple. No JVD present. No tracheal deviation present. No thyromegaly present.  Cardiovascular: Normal rate, regular rhythm, normal heart sounds and intact distal pulses.  Exam reveals no gallop and no friction rub.   No murmur heard. Pulmonary/Chest: Effort normal and breath sounds normal. No stridor. No respiratory distress. He has no wheezes. He has no rales. He exhibits no tenderness.  Abdominal: Soft. Normal appearance. He exhibits ascites. He exhibits no shifting dullness, no distension, no pulsatile liver, no fluid wave, no abdominal bruit, no pulsatile midline mass and no mass. Bowel sounds are decreased. There is no hepatosplenomegaly, splenomegaly or hepatomegaly. There is generalized tenderness. There is no rebound and no CVA tenderness. No hernia. Hernia confirmed negative in the ventral area, confirmed negative in the right inguinal area and confirmed negative in the left inguinal area.  Musculoskeletal: Normal range of motion. He exhibits no edema or tenderness.  Lymphadenopathy:    He has no cervical adenopathy.  Neurological: He is oriented to person, place, and time. He has normal strength. He displays tremor. He displays no atrophy. He exhibits normal muscle tone. He displays a negative Romberg sign. He displays no seizure activity. Coordination and gait normal.  He has a diffuse, subtle tremor  Skin: Skin is warm and dry. No rash noted. No erythema. No pallor.  Psychiatric: He has a normal mood and affect. His behavior is normal. Judgment and thought content normal. His mood appears not anxious. His speech is not delayed and not tangential. He is not agitated, not aggressive, not hyperactive, not slowed, not withdrawn, not actively hallucinating and not combative. Cognition and memory are normal. He does not exhibit a depressed mood. He expresses no homicidal and no suicidal ideation. He expresses no suicidal plans and no homicidal plans. He is attentive.    Vitals reviewed.   Lab Results  Component Value Date   WBC 8.1 11/05/2015   HGB 11.9* 11/05/2015   HCT 35.8* 11/05/2015   PLT 125.0* 11/05/2015   GLUCOSE 66* 11/05/2015   CHOL 156 04/07/2015   TRIG 208.0* 04/07/2015   HDL 35.20* 04/07/2015   LDLDIRECT 51.0 04/07/2015   LDLCALC 78 10/02/2014   ALT 30 11/05/2015   AST 49* 11/05/2015   NA 138 11/05/2015   K 3.7 11/05/2015   CL 103 11/05/2015   CREATININE 1.35 11/05/2015   BUN 18 11/05/2015   CO2 28 11/05/2015   TSH 22.58* 11/05/2015   PSA 0.26 06/14/2011   INR 1.4* 11/05/2015   HGBA1C 5.6 11/05/2015    US Abdomen Complete  10/29/2015  CLINICAL DATA:  Mid abdominal pain since yesterday. EXAM: ABDOMEN ULTRASOUND COMPLETE COMPARISON:  02/07/2014 FINDINGS: Gallbladder: Moderate distention of the gallbladder with asymmetric wall thickening. Gallbladder wall measures up to 1.1 cm 2 at the fundus. There are cystic type structures along the gallbladder wall and/or pericholecystic fluid. Echogenic material in the gallbladder could represent stones or sludge. Reportedly, the patient has a sonographic Murphy's sign. Common bile duct: Diameter: 0.6 cm. Liver: Liver is mildly heterogeneous without a focal lesion. Small amount of perihepatic  fluid. Main portal vein is patent with hepatopetal flow. IVC: No abnormality visualized. Pancreas: Visualized portion unremarkable. Spleen: Size and appearance within normal limits. Right Kidney: Length: 13.0 cm. Echogenicity within normal limits. No mass or hydronephrosis visualized. Left Kidney: Length: 13.8. Left kidney is poorly visualized due to bowel gas. No evidence for hydronephrosis. Abdominal aorta: No aneurysm visualized. Other findings: Small amount of ascites in the right lower quadrant and left lower quadrant. IMPRESSION: Gallbladder is markedly abnormal. The gallbladder is distended with asymmetric wall thickening. Fluid or cystic-type structures around the gallbladder wall. Probable sludge or  gallstones. In addition, there is small amount of ascites in the abdomen. Findings are concerning for acute cholecystitis. Recommend further evaluation with a CT of the abdomen/pelvis to better characterize the gallbladder disease and exclude a neoplastic process. Abdominal ascites could be secondary to gallbladder disease. The liver is mildly heterogeneous and difficult to exclude early cirrhotic changes. These findings could also be further characterized with CT. These results were called by telephone at the time of interpretation on 10/29/2015 at 1:43 pm to Dr. Theotis Burrow , who verbally acknowledged these results. Electronically Signed   By: Markus Daft M.D.   On: 10/29/2015 13:45   Ct Abdomen Pelvis W Contrast  10/29/2015  CLINICAL DATA:  Abdominal pain and shortness of breath. Abnormal gallbladder on ultrasound exam. Ascites. EXAM: CT ABDOMEN AND PELVIS WITH CONTRAST TECHNIQUE: Multidetector CT imaging of the abdomen and pelvis was performed using the standard protocol following bolus administration of intravenous contrast. CONTRAST:  115mL OMNIPAQUE IOHEXOL 300 MG/ML  SOLN COMPARISON:  Ultrasound dated 10/29/2015 FINDINGS: Lower chest:  Normal. Hepatobiliary: There is nodularity of the liver parenchyma prominence of the caudate lobe. This is consistent with cirrhosis. There are multiple small stones in the gallbladder. The gallbladder wall is thickened. There is an enhancing 16 mm nodule at the tip of the gallbladder worrisome for carcinoma. There is an adjacent calcification in the wall of the gallbladder. There are no dilated bile ducts. Pancreas: Pancreatic parenchyma is normal. Slight haziness in the peripancreatic fat suggests mild pancreatitis. Spleen: The spleen is at the upper limits of normal. There multiple varices in the left upper quadrant which extend around the gastroesophageal junction. This is suggestive of portal hypertension. Adrenals/Urinary Tract: 21 mm low-density lesion on the left  adrenal gland consistent with a benign adrenal adenoma. Kidneys, ureters, and bladder are normal. Stomach/Bowel: There is slight mucosal edema in the rectum and in the cecum and ascending colon. The appendix and small bowel are normal. Vascular/Lymphatic: Extensive calcification in the abdominal aorta and iliac arteries. Reproductive: Normal. Other: Ascites primarily around the liver and spleen. Soft tissue edema in the mesenteries. Musculoskeletal: No acute abnormality. Degenerative disc disease in the lower lumbar spine. IMPRESSION: 1. Cirrhosis with ascites. 2. Cholelithiasis. 3. Focal enhancing nodule at the tip of the gallbladder worrisome for carcinoma. 4. Upper abdominal varices consistent with portal hypertension. 5. Aortic atherosclerosis. Electronically Signed   By: Lorriane Shire M.D.   On: 10/29/2015 14:56    Assessment & Plan:   Dareus was seen today for acute visit and anemia.  Diagnoses and all orders for this visit:  Mass of gallbladder -     Ambulatory referral to General Surgery  Encephalopathy, hepatic ([place])- we'll discontinue the opiate, will treat the constipation and probable ammonia elevation with lactulose. -     AMMONIA; Future -     lactulose (CHRONULAC) 10 GM/15ML solution; Take 15 mLs (10 g total) by mouth 3 (  three) times daily.  Essential hypertension- his blood pressure is well-controlled, lites and renal function are stable. -     Comprehensive metabolic panel; Future  Pernicious anemia- his hemoglobin and hematocrit are stable today, all of his vitamin levels are normal, this appears to be anemia of chronic disease, will follow for now. -     CBC with Differential/Platelet; Future -     IBC panel; Future -     Vitamin B12; Future -     Folate; Future -     Ferritin; Future  Hyperglycemia- he has very mild prediabetes, no medications are needed for this at this time. -     Hemoglobin A1c; Future  Other specified hypothyroidism- his TSH remains elevated, I  have asked him to add back the 25 g a day to the 200 g once a day. -     TSH; Future  Alcoholic cirrhosis of liver with ascites ([place])- he has abstained for the last 4 weeks and he was congratulated for that, he appears to have hepatic encephalopathy so I have started lactulose. -     Comprehensive metabolic panel; Future -     Protime-INR; Future -     lactulose (CHRONULAC) 10 GM/15ML solution; Take 15 mLs (10 g total) by mouth 3 (three) times daily.  I have discontinued Mr. Bulson HYDROcodone-acetaminophen. I am also having him start on lactulose. Additionally, I am having him maintain his aspirin, sildenafil, metoprolol succinate, levothyroxine, atorvastatin, losartan, and levothyroxine.  Meds ordered this encounter  Medications  . lactulose (CHRONULAC) 10 GM/15ML solution    Sig: Take 15 mLs (10 g total) by mouth 3 (three) times daily.    Dispense:  473 mL    Refill:  1  . levothyroxine (LEVOTHROID) 25 MCG tablet    Sig: Take 1 tablet (25 mcg total) by mouth daily before breakfast. Take with 226mcg for total 263mcg daily    Dispense:  90 tablet    Refill:  1     Follow-up: Return in about 3 weeks (around 11/26/2015).  Scarlette Calico, MD

## 2015-11-06 ENCOUNTER — Other Ambulatory Visit: Payer: Self-pay

## 2015-11-06 DIAGNOSIS — E038 Other specified hypothyroidism: Secondary | ICD-10-CM

## 2015-11-06 LAB — AMMONIA: Ammonia: 124 umol/L — ABNORMAL HIGH (ref 16–53)

## 2015-11-06 MED ORDER — LEVOTHYROXINE SODIUM 25 MCG PO TABS: 25.0000 ug | ORAL_TABLET | Freq: Every day | ORAL | Status: DC

## 2015-11-20 ENCOUNTER — Ambulatory Visit: Payer: Self-pay | Admitting: General Surgery

## 2015-11-20 NOTE — H&P (Signed)
History of Present Illness Jon Spruce MD; 11/20/2015 2:31 PM) Patient words: evaluate gallbladder.  The patient is a 55 year old male who presents for evaluation of gall stones. Dr. Ronnald Ramp has asked me to see Jon Boyd. A 55 year old male in the ER last week with right upper quadrant abdominal pain. This is the first time that this is happened. He did have nausea and vomiting. He said that he came on suddenly is not related to food. He denies fevers or chills. He has lost 35 pounds in the past 2 years. He has not had any change in bowel movements. He does have known hemorrhoids with occasional rectal bleeding.  He has a significant drinking history currently he only drinks 1 alcoholic beverage per week.  He has a significant smoking history currently he smokes 12-15 cigarettes per day down from 2 packs previously.   Other Problems Jon Boyd, [place]; 11/20/2015 1:36 PM) Alcohol Abuse Anxiety Disorder Bladder Problems Cerebrovascular Accident Chest pain Cirrhosis Of Liver Hemorrhoids High blood pressure Hypercholesterolemia Kidney Stone Thyroid Disease  Past Surgical History Jon Boyd, [place]; 11/20/2015 1:36 PM) Colon Polyp Removal - Colonoscopy  Diagnostic Studies History Jon Boyd, CMA; 11/20/2015 1:36 PM) Colonoscopy 1-5 years ago  Allergies Jon Boyd, CMA; 11/20/2015 1:36 PM) No Known Drug Allergies01/19/2017  Medication History Jon Boyd, CMA; 11/20/2015 1:36 PM) Atorvastatin Calcium (20MG  Tablet, Oral) Active. Hydrocodone-Acetaminophen (5-325MG  Tablet, Oral) Active. Lactulose (10GM/15ML Solution, Oral) Active. Levothyroxine Sodium (200MCG Tablet, Oral) Active. Losartan Potassium (50MG  Tablet, Oral) Active. Metoprolol Succinate ER (25MG  Tablet ER 24HR, Oral) Active. Medications Reconciled  Social History Jon Boyd, [place]; 11/20/2015 1:36 PM) Alcohol use Moderate alcohol use. Caffeine use Coffee. No drug use  Family  History Jon Boyd, [place]; 11/20/2015 1:36 PM) Alcohol Abuse Brother, Father. Colon Cancer Father. Colon Polyps Father. Diabetes Mellitus Father. Hypertension Father, Mother. Kidney Disease Mother. Melanoma Mother. Migraine Headache Mother. Thyroid problems Mother, Sister.    Review of Systems Jon Spruce MD; 11/20/2015 2:23 PM) General Present- Appetite Loss, Fatigue, Night Sweats and Weight Loss. Not Present- Chills, Fever and Weight Gain. Skin Present- Dryness. Not Present- Change in Wart/Mole, Hives, Jaundice, New Lesions, Non-Healing Wounds, Rash and Ulcer. HEENT Present- Earache, Hearing Loss, Nose Bleed, Ringing in the Ears and Visual Disturbances. Not Present- Hoarseness, Oral Ulcers, Seasonal Allergies, Sinus Pain, Sore Throat, Wears glasses/contact lenses and Yellow Eyes. Respiratory Present- Chronic Cough, Difficulty Breathing and Wheezing. Not Present- Bloody sputum and Snoring. Cardiovascular Present- Chest Pain, Leg Cramps and Shortness of Breath. Not Present- Difficulty Breathing Lying Down, Palpitations, Rapid Heart Rate and Swelling of Extremities. Gastrointestinal Present- Abdominal Pain, Bloody Stool, Change in Bowel Habits, Gets full quickly at meals and Hemorrhoids. Not Present- Bloating, Chronic diarrhea, Constipation, Difficulty Swallowing, Excessive gas, Indigestion, Nausea, Rectal Pain and Vomiting. Male Genitourinary Present- Frequency and Nocturia. Not Present- Blood in Urine, Change in Urinary Stream, Impotence, Painful Urination, Urgency and Urine Leakage. Musculoskeletal Present- Back Pain. Not Present- Claudication, Decreased Range of Motion and Fasciculations. Neurological Not Present- Attention Deficit, Decreased Memory, Difficulty Speaking and Dizziness. Endocrine Not Present- Excessive Hunger, Excessive Sweating, Excessive Thirst and Excessive Urination.  Vitals Jon Boyd CMA; 11/20/2015 1:36 PM) 11/20/2015 1:35 PM Weight: 195.4 lb  Height: 71in Body Surface Area: 2.09 m Body Mass Index: 27.25 kg/m  Temp.: 55F(Temporal)  Pulse: 100 (Regular)  Resp.: 18 (Unlabored)  BP: 138/82 (Sitting, Left Arm, Standard)       Physical Exam Jon Spruce MD; 11/20/2015 2:27 PM) General Mental Status-Alert. General Appearance-Cooperative.  Orientation-Oriented X4. Posture-Normal posture.  Integumentary Global Assessment Normal Exam - Head/Face: no rashes, ulcers, lesions or evidence of photo damage. No palpable nodules or masses and Neck: no visible lesions or palpable masses.  Head and Neck Head-normocephalic, atraumatic with no lesions or palpable masses. Face Global Assessment - atraumatic. Thyroid Gland Characteristics - normal size and consistency.  Eye Eyeball - Bilateral-Extraocular movements intact. Sclera/Conjunctiva Sclera/Conjunctiva - Bilateral-Sclera icteric, No Discharge.  ENMT Nose and Sinuses Nose - no deformities observed, no swelling present.  Chest and Lung Exam Palpation Normal exam - Non-tender. Auscultation Breath sounds - Normal.  Cardiovascular Auscultation Rhythm - Regular. Heart Sounds - S1 WNL and S2 WNL. Carotid arteries - No Carotid bruit.  Abdomen Inspection Normal Exam - No Visible peristalsis, No Abnormal pulsations and No Paradoxical movements. Palpation/Percussion Normal exam - Soft, No Rebound tenderness, No Rigidity (guarding), No hepatosplenomegaly and No Palpable abdominal masses. Tenderness - Right Upper Quadrant. Gallbladder - Negative Murphy's sign. Note: Firmness over the right upper quadrant approximately 3 finger lengths below the costal margin. Tenderness directly over this area   Peripheral Vascular Upper Extremity Palpation - Pulses bilaterally normal. Lower Extremity Palpation - Edema - Bilateral - No edema.  Neurologic Neurologic evaluation reveals -normal sensation and normal  coordination.  Neuropsychiatric Mental status exam performed with findings of-able to articulate well with normal speech/language, rate, volume and coherence and thought content normal with ability to perform basic computations and apply abstract reasoning.  Musculoskeletal Normal Exam - Bilateral-Upper Extremity Strength Normal and Lower Extremity Strength Normal.    Assessment & Plan Jon Spruce MD; 11/20/2015 2:31 PM) GALLBLADDER MASS (K82.8) Impression: 55 year old male with concerning ultrasound and CT scan findings for gallbladder mass. Imaging was done for right upper quadrant pain, and she has known cirrhotic disease secondary to alcohol abuse in the past. I discussed these findings with him, and that irregular gallbladder wall thickening is assigned of cancer until proven otherwise and is concerning in the face of his weight loss. Given that he was having symptoms consistent with gallbladder disease as well I think the best plan is to expedite gallbladder removal. I discussed the surgery in detail that we could attempt a laparoscopic approach, that the risks involved are bile duct injury, cystic duct leak, liver injury, bleeding, injury to intestines, and incisional hernia. I discussed that in the case that tumor was found further dissection would be necessary often requiring an open procedure often requiring taking a rim of liver. As there is no evidence on CT scan it did not, discussions of formal hepatic lobectomy at this time. He showed understanding and wanted to proceed. I'll obtain a CEA in the meantime and plan for surgery within the next month Current Plans CARCINOEMBRYONIC ANTIGEN (CEA) [id number]) You are being scheduled for surgery - Our schedulers will call you.  You should hear from our office's scheduling department within 5 working days about the location, date, and time of surgery. We try to make accommodations for patient's preferences in scheduling surgery, but  sometimes the OR schedule or the surgeon's schedule prevents [place] from making those accommodations.  If you have not heard from our office [phone]) in 5 working days, call the office and ask for your surgeon's nurse.  If you have other questions about your diagnosis, plan, or surgery, call the office and ask for your surgeon's nurse.  Pt Education - Laparoscopic Cholecystectomy: gallbladder The anatomy & physiology of hepatobiliary & pancreatic function was discussed. The pathophysiology of gallbladder dysfunction was  discussed. Natural history risks without surgery was discussed. I feel the risks of no intervention will lead to serious problems that outweigh the operative risks; therefore, I recommended cholecystectomy to remove the pathology. I explained laparoscopic techniques with possible need for an open approach. Probable cholangiogram to evaluate the bilary tract was explained as well.  Risks such as bleeding, infection, abscess, leak, injury to other organs, need for further treatment, heart attack, death, and other risks were discussed. I noted a good likelihood this will help address the problem. Possibility that this will not correct all abdominal symptoms was explained. Goals of post-operative recovery were discussed as well. We will work to minimize complications. An educational handout further explaining the pathology and treatment options was given as well. Questions were answered. The patient expresses understanding & wishes to proceed with surgery.  CIRRHOSIS (K74.60) Impression: MELD of 15. Child's B by criteria from ER. Discussed how this puts increased risk of gallbladder surgery. -continue lactulose per primary

## 2015-12-19 ENCOUNTER — Encounter (HOSPITAL_COMMUNITY)
Admission: RE | Admit: 2015-12-19 | Discharge: 2015-12-19 | Disposition: A | Discharge status: Home / Self Care | Payer: Commercial Managed Care - PPO | Source: Ambulatory Visit | Attending: General Surgery | Admitting: General Surgery

## 2015-12-19 ENCOUNTER — Encounter (HOSPITAL_COMMUNITY): Payer: Self-pay

## 2015-12-19 DIAGNOSIS — K829 Disease of gallbladder, unspecified: Secondary | ICD-10-CM | POA: Diagnosis not present

## 2015-12-19 DIAGNOSIS — Z01812 Encounter for preprocedural laboratory examination: Secondary | ICD-10-CM | POA: Diagnosis present

## 2015-12-19 DIAGNOSIS — Z0183 Encounter for blood typing: Secondary | ICD-10-CM | POA: Insufficient documentation

## 2015-12-19 LAB — CBC WITH DIFFERENTIAL/PLATELET
BASOS ABS: 0 10*3/uL (ref 0.0–0.1)
Basophils Relative: 0 %
EOS ABS: 0.1 10*3/uL (ref 0.0–0.7)
Eosinophils Relative: 2 %
HCT: 34 % — ABNORMAL LOW (ref 39.0–52.0)
HEMOGLOBIN: 12.2 g/dL — AB (ref 13.0–17.0)
LYMPHS ABS: 1.7 10*3/uL (ref 0.7–4.0)
LYMPHS PCT: 25 %
MCH: 33.4 pg (ref 26.0–34.0)
MCHC: 35.9 g/dL (ref 30.0–36.0)
MCV: 93.2 fL (ref 78.0–100.0)
MONOS PCT: 14 %
Monocytes Absolute: 0.9 10*3/uL (ref 0.1–1.0)
NEUTROS ABS: 4 10*3/uL (ref 1.7–7.7)
NEUTROS PCT: 59 %
Platelets: 48 10*3/uL — ABNORMAL LOW (ref 150–400)
RBC: 3.65 MIL/uL — AB (ref 4.22–5.81)
RDW: 21.4 % — AB (ref 11.5–15.5)
WBC: 6.7 10*3/uL (ref 4.0–10.5)

## 2015-12-19 LAB — ABO/RH: ABO/RH(D): O POS

## 2015-12-19 LAB — COMPREHENSIVE METABOLIC PANEL
ALBUMIN: 3 g/dL — AB (ref 3.5–5.0)
ALT: 42 U/L (ref 17–63)
ANION GAP: 10 (ref 5–15)
AST: 126 U/L — AB (ref 15–41)
Alkaline Phosphatase: 209 U/L — ABNORMAL HIGH (ref 38–126)
BUN: 10 mg/dL (ref 6–20)
CO2: 22 mmol/L (ref 22–32)
Calcium: 8.1 mg/dL — ABNORMAL LOW (ref 8.9–10.3)
Chloride: 101 mmol/L (ref 101–111)
Creatinine, Ser: 0.92 mg/dL (ref 0.61–1.24)
GFR calc Af Amer: >60 mL/min (ref >60)
GFR calc non Af Amer: >60 mL/min (ref >60)
GLUCOSE: 112 mg/dL — AB (ref 65–99)
POTASSIUM: 3.6 mmol/L (ref 3.5–5.1)
SODIUM: 133 mmol/L — AB (ref 135–145)
Total Bilirubin: 3.4 mg/dL — ABNORMAL HIGH (ref 0.3–1.2)
Total Protein: 6.9 g/dL (ref 6.5–8.1)

## 2015-12-19 LAB — PROTIME-INR
INR: 1.39 (ref 0.00–1.49)
Prothrombin Time: 17.1 seconds — ABNORMAL HIGH (ref 11.6–15.2)

## 2015-12-19 NOTE — Patient Instructions (Signed)
Jon Boyd  12/19/2015   Your procedure is scheduled on: Wednesday 12/24/15  Report to [facility]  Entrance take [facility]  elevators to 3rd floor to  [place] at 6:30AM.  Call this number if you have problems the morning of surgery [phone]   Remember: ONLY 1 PERSON MAY GO WITH YOU TO SHORT STAY TO GET  READY MORNING OF [place].  Do not eat food or drink liquids :After Midnight.     Take these medicines the morning of surgery with A SIP OF WATER: Atorvastatin (lipitor), Levothyroxine                               You may not have any metal on your body including hair pins and              piercings  Do not wear jewelry, make-up, lotions, powders or perfumes, deodorant             Do not wear nail polish.  Do not shave  48 hours prior to surgery.              Men may shave face and neck.   Do not bring valuables to the hospital. [place].  Contacts, dentures or bridgework may not be worn into surgery.  Leave suitcase in the car. After surgery it may be brought to your room.     Special Instructions: N/A              Please read over the following fact sheets you were given: _____________________________________________________________________             [facility] - Preparing for Surgery Before surgery, you can play an important role.  Because skin is not sterile, your skin needs to be as free of germs as possible.  You can reduce the number of germs on your skin by washing with CHG (chlorahexidine gluconate) soap before surgery.  CHG is an antiseptic cleaner which kills germs and bonds with the skin to continue killing germs even after washing. Please DO NOT use if you have an allergy to CHG or antibacterial soaps.  If your skin becomes reddened/irritated stop using the CHG and inform your nurse when you arrive at Short Stay. Do not shave (including legs and underarms) for at least 48  hours prior to the first CHG shower.  You may shave your face/neck. Please follow these instructions carefully:  1.  Shower with CHG Soap the night before surgery and the  morning of Surgery.  2.  If you choose to wash your hair, wash your hair first as usual with your  normal  shampoo.  3.  After you shampoo, rinse your hair and body thoroughly to remove the  shampoo.                           4.  Use CHG as you would any other liquid soap.  You can apply chg directly  to the skin and wash                       Gently with a scrungie or clean washcloth.  5.  Apply the CHG Soap to your body ONLY FROM THE NECK DOWN.   Do not use on face/ open                           Wound or open sores. Avoid contact with eyes, ears mouth and genitals (private parts).                       Wash face,  Genitals (private parts) with your normal soap.             6.  Wash thoroughly, paying special attention to the area where your surgery  will be performed.  7.  Thoroughly rinse your body with warm water from the neck down.  8.  DO NOT shower/wash with your normal soap after using and rinsing off  the CHG Soap.                9.  Pat yourself dry with a clean towel.            10.  Wear clean pajamas.            11.  Place clean sheets on your bed the night of your first shower and do not  sleep with pets. Day of Surgery : Do not apply any lotions/deodorants the morning of surgery.  Please wear clean clothes to the hospital/surgery center.  FAILURE TO FOLLOW THESE INSTRUCTIONS MAY RESULT IN THE CANCELLATION OF YOUR SURGERY PATIENT SIGNATURE_________________________________  NURSE SIGNATURE__________________________________  ________________________________________________________________________

## 2015-12-19 NOTE — Pre-Procedure Instructions (Signed)
10-30-15 EKG, epic

## 2015-12-23 ENCOUNTER — Ambulatory Visit: Payer: PRIVATE HEALTH INSURANCE | Admitting: Gastroenterology

## 2015-12-24 ENCOUNTER — Inpatient Hospital Stay (HOSPITAL_COMMUNITY): Payer: Commercial Managed Care - PPO | Admitting: Anesthesiology

## 2015-12-24 ENCOUNTER — Encounter (HOSPITAL_COMMUNITY): Payer: Self-pay | Admitting: Anesthesiology

## 2015-12-24 ENCOUNTER — Encounter (HOSPITAL_COMMUNITY)
Admission: RE | Disposition: A | Discharge status: Home / Self Care | Payer: Self-pay | Source: Ambulatory Visit | Attending: General Surgery

## 2015-12-24 ENCOUNTER — Observation Stay (HOSPITAL_COMMUNITY)
Admission: RE | Admit: 2015-12-24 | Discharge: 2015-12-25 | Disposition: A | Discharge status: Home / Self Care | Payer: Commercial Managed Care - PPO | Source: Ambulatory Visit | Attending: General Surgery | Admitting: General Surgery

## 2015-12-24 DIAGNOSIS — E785 Hyperlipidemia, unspecified: Secondary | ICD-10-CM | POA: Insufficient documentation

## 2015-12-24 DIAGNOSIS — R109 Unspecified abdominal pain: Secondary | ICD-10-CM | POA: Diagnosis present

## 2015-12-24 DIAGNOSIS — Z8673 Personal history of transient ischemic attack (TIA), and cerebral infarction without residual deficits: Secondary | ICD-10-CM | POA: Insufficient documentation

## 2015-12-24 DIAGNOSIS — K8010 Calculus of gallbladder with chronic cholecystitis without obstruction: Secondary | ICD-10-CM | POA: Diagnosis present

## 2015-12-24 DIAGNOSIS — D649 Anemia, unspecified: Secondary | ICD-10-CM | POA: Diagnosis not present

## 2015-12-24 DIAGNOSIS — K811 Chronic cholecystitis: Secondary | ICD-10-CM | POA: Diagnosis not present

## 2015-12-24 DIAGNOSIS — F17200 Nicotine dependence, unspecified, uncomplicated: Secondary | ICD-10-CM | POA: Diagnosis not present

## 2015-12-24 DIAGNOSIS — K7460 Unspecified cirrhosis of liver: Secondary | ICD-10-CM | POA: Insufficient documentation

## 2015-12-24 DIAGNOSIS — K828 Other specified diseases of gallbladder: Secondary | ICD-10-CM | POA: Insufficient documentation

## 2015-12-24 DIAGNOSIS — Z7982 Long term (current) use of aspirin: Secondary | ICD-10-CM | POA: Diagnosis not present

## 2015-12-24 DIAGNOSIS — E039 Hypothyroidism, unspecified: Secondary | ICD-10-CM | POA: Insufficient documentation

## 2015-12-24 DIAGNOSIS — I10 Essential (primary) hypertension: Secondary | ICD-10-CM | POA: Insufficient documentation

## 2015-12-24 DIAGNOSIS — Z79899 Other long term (current) drug therapy: Secondary | ICD-10-CM | POA: Insufficient documentation

## 2015-12-24 HISTORY — PX: CHOLECYSTECTOMY: SHX55

## 2015-12-24 HISTORY — PX: LYMPH NODE DISSECTION: SHX5087

## 2015-12-24 LAB — PREPARE PLATELET PHERESIS
Unit division: 0
Unit division: 0

## 2015-12-24 LAB — PREPARE RBC (CROSSMATCH)

## 2015-12-24 SURGERY — LAPAROSCOPIC CHOLECYSTECTOMY
Anesthesia: General

## 2015-12-24 MED ORDER — ONDANSETRON HCL 4 MG/2ML IJ SOLN: 4.0000 mg | Freq: Four times a day (QID) | INTRAMUSCULAR | Status: DC | PRN

## 2015-12-24 MED ORDER — CEFAZOLIN SODIUM-DEXTROSE 2-3 GM-% IV SOLR
INTRAVENOUS | Status: AC
Start: 2015-12-24 — End: 2015-12-24
  Filled 2015-12-24: qty 50

## 2015-12-24 MED ORDER — SODIUM CHLORIDE 0.9 % IV SOLN
INTRAVENOUS | Status: DC
  Administered 2015-12-24: 14:00:00 via INTRAVENOUS

## 2015-12-24 MED ORDER — MORPHINE SULFATE (PF) 2 MG/ML IV SOLN
1.0000 mg | INTRAVENOUS | Status: DC | PRN
  Administered 2015-12-24 – 2015-12-25 (×3): 2 mg via INTRAVENOUS
  Filled 2015-12-24 (×3): qty 1

## 2015-12-24 MED ORDER — PHENYLEPHRINE 40 MCG/ML (10ML) SYRINGE FOR IV PUSH (FOR BLOOD PRESSURE SUPPORT)
PREFILLED_SYRINGE | INTRAVENOUS | Status: AC
  Filled 2015-12-24: qty 10

## 2015-12-24 MED ORDER — ROCURONIUM BROMIDE 100 MG/10ML IV SOLN
INTRAVENOUS | Status: DC | PRN
  Administered 2015-12-24: 10 mg via INTRAVENOUS
  Administered 2015-12-24: 40 mg via INTRAVENOUS

## 2015-12-24 MED ORDER — HYDROMORPHONE HCL 1 MG/ML IJ SOLN: 0.2500 mg | INTRAMUSCULAR | Status: DC | PRN

## 2015-12-24 MED ORDER — SODIUM CHLORIDE 0.9 % IV SOLN: Freq: Once | INTRAVENOUS | Status: DC

## 2015-12-24 MED ORDER — ROCURONIUM BROMIDE 100 MG/10ML IV SOLN
INTRAVENOUS | Status: AC
  Filled 2015-12-24: qty 1

## 2015-12-24 MED ORDER — 0.9 % SODIUM CHLORIDE (POUR BTL) OPTIME
TOPICAL | Status: DC | PRN
  Administered 2015-12-24: 1000 mL

## 2015-12-24 MED ORDER — BUPIVACAINE-EPINEPHRINE 0.25% -1:200000 IJ SOLN
INTRAMUSCULAR | Status: DC | PRN
  Administered 2015-12-24: 47 mL

## 2015-12-24 MED ORDER — METOPROLOL SUCCINATE ER 25 MG PO TB24
25.0000 mg | ORAL_TABLET | Freq: Once | ORAL | Status: AC
  Administered 2015-12-24: 25 mg via ORAL
  Filled 2015-12-24: qty 1

## 2015-12-24 MED ORDER — LACTATED RINGERS IV SOLN
INTRAVENOUS | Status: DC | PRN
  Administered 2015-12-24: 08:00:00 via INTRAVENOUS

## 2015-12-24 MED ORDER — SUGAMMADEX SODIUM 200 MG/2ML IV SOLN
INTRAVENOUS | Status: DC | PRN
  Administered 2015-12-24: 175 mg via INTRAVENOUS

## 2015-12-24 MED ORDER — BISACODYL 5 MG PO TBEC: 5.0000 mg | DELAYED_RELEASE_TABLET | Freq: Every day | ORAL | Status: DC | PRN

## 2015-12-24 MED ORDER — EPHEDRINE SULFATE 50 MG/ML IJ SOLN
INTRAMUSCULAR | Status: AC
  Filled 2015-12-24: qty 1

## 2015-12-24 MED ORDER — LACTULOSE 10 GM/15ML PO SOLN
10.0000 g | Freq: Three times a day (TID) | ORAL | Status: DC
  Administered 2015-12-24 – 2015-12-25 (×3): 10 g via ORAL
  Filled 2015-12-24 (×5): qty 15

## 2015-12-24 MED ORDER — MIDAZOLAM HCL 5 MG/5ML IJ SOLN
INTRAMUSCULAR | Status: DC | PRN
  Administered 2015-12-24: 2 mg via INTRAVENOUS

## 2015-12-24 MED ORDER — DOCUSATE SODIUM 100 MG PO CAPS
100.0000 mg | ORAL_CAPSULE | Freq: Two times a day (BID) | ORAL | Status: DC
  Administered 2015-12-24 – 2015-12-25 (×2): 100 mg via ORAL
  Filled 2015-12-24 (×3): qty 1

## 2015-12-24 MED ORDER — CEFAZOLIN SODIUM-DEXTROSE 2-3 GM-% IV SOLR
2.0000 g | INTRAVENOUS | Status: AC
  Administered 2015-12-24: 2 g via INTRAVENOUS

## 2015-12-24 MED ORDER — METOPROLOL SUCCINATE ER 25 MG PO TB24
25.0000 mg | ORAL_TABLET | Freq: Every day | ORAL | Status: DC
Start: 2015-12-25 — End: 2015-12-25
  Administered 2015-12-25: 25 mg via ORAL
  Filled 2015-12-24: qty 1

## 2015-12-24 MED ORDER — FENTANYL CITRATE (PF) 100 MCG/2ML IJ SOLN
INTRAMUSCULAR | Status: DC | PRN
  Administered 2015-12-24: 50 ug via INTRAVENOUS
  Administered 2015-12-24: 100 ug via INTRAVENOUS
  Administered 2015-12-24 (×2): 50 ug via INTRAVENOUS

## 2015-12-24 MED ORDER — CHLORHEXIDINE GLUCONATE 4 % EX LIQD: 1.0000 "application " | Freq: Once | CUTANEOUS | Status: DC

## 2015-12-24 MED ORDER — LACTATED RINGERS IV SOLN
INTRAVENOUS | Status: DC
  Administered 2015-12-24: 1000 mL via INTRAVENOUS

## 2015-12-24 MED ORDER — HYDROMORPHONE HCL 1 MG/ML IJ SOLN
INTRAMUSCULAR | Status: DC | PRN
  Administered 2015-12-24: 1 mg via INTRAVENOUS

## 2015-12-24 MED ORDER — PROPOFOL 10 MG/ML IV BOLUS
INTRAVENOUS | Status: AC
  Filled 2015-12-24: qty 20

## 2015-12-24 MED ORDER — EPHEDRINE SULFATE 50 MG/ML IJ SOLN
INTRAMUSCULAR | Status: DC | PRN
  Administered 2015-12-24: 10 mg via INTRAVENOUS

## 2015-12-24 MED ORDER — FENTANYL CITRATE (PF) 250 MCG/5ML IJ SOLN
INTRAMUSCULAR | Status: AC
  Filled 2015-12-24: qty 5

## 2015-12-24 MED ORDER — ONDANSETRON HCL 4 MG/2ML IJ SOLN
INTRAMUSCULAR | Status: AC
  Filled 2015-12-24: qty 2

## 2015-12-24 MED ORDER — LIDOCAINE HCL (CARDIAC) 20 MG/ML IV SOLN
INTRAVENOUS | Status: AC
  Filled 2015-12-24: qty 5

## 2015-12-24 MED ORDER — HYDROMORPHONE HCL 2 MG/ML IJ SOLN
INTRAMUSCULAR | Status: AC
  Filled 2015-12-24: qty 1

## 2015-12-24 MED ORDER — LACTATED RINGERS IR SOLN
Status: DC | PRN
  Administered 2015-12-24: 1000 mL

## 2015-12-24 MED ORDER — MIDAZOLAM HCL 2 MG/2ML IJ SOLN
INTRAMUSCULAR | Status: AC
  Filled 2015-12-24: qty 2

## 2015-12-24 MED ORDER — ONDANSETRON 4 MG PO TBDP: 4.0000 mg | ORAL_TABLET | Freq: Four times a day (QID) | ORAL | Status: DC | PRN

## 2015-12-24 MED ORDER — OXYCODONE HCL 5 MG PO TABS: 5.0000 mg | ORAL_TABLET | ORAL | Status: DC | PRN

## 2015-12-24 MED ORDER — SUGAMMADEX SODIUM 200 MG/2ML IV SOLN
INTRAVENOUS | Status: AC
  Filled 2015-12-24: qty 2

## 2015-12-24 MED ORDER — PROPOFOL 10 MG/ML IV BOLUS
INTRAVENOUS | Status: DC | PRN
  Administered 2015-12-24: 180 mg via INTRAVENOUS

## 2015-12-24 MED ORDER — PHENYLEPHRINE HCL 10 MG/ML IJ SOLN
INTRAMUSCULAR | Status: DC | PRN
  Administered 2015-12-24: 40 ug via INTRAVENOUS
  Administered 2015-12-24: 80 ug via INTRAVENOUS

## 2015-12-24 MED ORDER — PROMETHAZINE HCL 25 MG/ML IJ SOLN
6.2500 mg | INTRAMUSCULAR | Status: DC | PRN
Start: 2015-12-24 — End: 2015-12-24

## 2015-12-24 MED ORDER — LIDOCAINE HCL (CARDIAC) 20 MG/ML IV SOLN
INTRAVENOUS | Status: DC | PRN
  Administered 2015-12-24: 50 mg via INTRAVENOUS

## 2015-12-24 MED ORDER — BUPIVACAINE-EPINEPHRINE 0.25% -1:200000 IJ SOLN
INTRAMUSCULAR | Status: AC
  Filled 2015-12-24: qty 1

## 2015-12-24 MED ORDER — ONDANSETRON HCL 4 MG/2ML IJ SOLN
INTRAMUSCULAR | Status: DC | PRN
  Administered 2015-12-24: 4 mg via INTRAVENOUS

## 2015-12-24 MED ORDER — LEVOTHYROXINE SODIUM 200 MCG PO TABS
200.0000 ug | ORAL_TABLET | Freq: Every day | ORAL | Status: DC
  Administered 2015-12-24 – 2015-12-25 (×2): 200 ug via ORAL
  Filled 2015-12-24 (×3): qty 1

## 2015-12-24 MED ORDER — LEVOTHYROXINE SODIUM 25 MCG PO TABS
25.0000 ug | ORAL_TABLET | Freq: Every day | ORAL | Status: DC
  Administered 2015-12-24 – 2015-12-25 (×2): 25 ug via ORAL
  Filled 2015-12-24 (×3): qty 1

## 2015-12-24 SURGICAL SUPPLY — 46 items
APL SRG 38 LTWT LNG FL B (MISCELLANEOUS) ×1
APPLICATOR ARISTA FLEXITIP XL (MISCELLANEOUS) ×2 IMPLANT
APPLIER CLIP 5 13 M/L LIGAMAX5 (MISCELLANEOUS)
APR CLP MED LRG 5 ANG JAW (MISCELLANEOUS)
CHLORAPREP W/TINT 26ML (MISCELLANEOUS) ×3 IMPLANT
CLIP APPLIE 5 13 M/L LIGAMAX5 (MISCELLANEOUS) IMPLANT
CLIP LIGATING HEM O LOK PURPLE (MISCELLANEOUS) ×6 IMPLANT
COVER MAYO STAND STRL (DRAPES) IMPLANT
COVER SURGICAL LIGHT HANDLE (MISCELLANEOUS) ×1 IMPLANT
CUTTER FLEX LINEAR 45M (STAPLE) IMPLANT
DECANTER SPIKE VIAL GLASS SM (MISCELLANEOUS) ×3 IMPLANT
DEVICE PMI PUNCTURE CLOSURE (MISCELLANEOUS) ×2 IMPLANT
DEVICE SECURE STRAP 25 ABSORB (INSTRUMENTS) IMPLANT
DRAIN CHANNEL 19F RND (DRAIN) IMPLANT
DRAPE C-ARM 42X120 X-RAY (DRAPES) IMPLANT
DRAPE LAPAROSCOPIC ABDOMINAL (DRAPES) ×1 IMPLANT
ELECT REM PT RETURN 9FT ADLT (ELECTROSURGICAL) ×3
ELECTRODE REM PT RTRN 9FT ADLT (ELECTROSURGICAL) ×1 IMPLANT
EVACUATOR SILICONE 100CC (DRAIN) IMPLANT
GLOVE BIO SURGEON STRL SZ7 (GLOVE) ×3 IMPLANT
GLOVE BIOGEL PI IND STRL 7.0 (GLOVE) ×1 IMPLANT
GLOVE BIOGEL PI INDICATOR 7.0 (GLOVE) ×2
GOWN STRL REUS W/TWL LRG LVL3 (GOWN DISPOSABLE) ×3 IMPLANT
GOWN STRL REUS W/TWL XL LVL3 (GOWN DISPOSABLE) ×8 IMPLANT
HEMOSTAT ARISTA ABSORB 3G PWDR (MISCELLANEOUS) ×2 IMPLANT
KIT BASIN OR (CUSTOM PROCEDURE TRAY) ×3 IMPLANT
LIQUID BAND (GAUZE/BANDAGES/DRESSINGS) ×3 IMPLANT
MARKER SKIN DUAL TIP RULER LAB (MISCELLANEOUS) ×3 IMPLANT
PAD POSITIONING PINK XL (MISCELLANEOUS) ×2 IMPLANT
POSITIONER SURGICAL ARM (MISCELLANEOUS) IMPLANT
RELOAD 45 VASCULAR/THIN (ENDOMECHANICALS) IMPLANT
RELOAD STAPLE 45 2.5 WHT GRN (ENDOMECHANICALS) IMPLANT
RELOAD STAPLE 45 3.5 BLU ETS (ENDOMECHANICALS) IMPLANT
RELOAD STAPLE TA45 3.5 REG BLU (ENDOMECHANICALS) IMPLANT
SCISSORS LAP 5X35 DISP (ENDOMECHANICALS) ×3 IMPLANT
SET IRRIG TUBING LAPAROSCOPIC (IRRIGATION / IRRIGATOR) ×2 IMPLANT
SHEARS HARMONIC ACE PLUS 36CM (ENDOMECHANICALS) IMPLANT
SLEEVE XCEL OPT CAN 5 100 (ENDOMECHANICALS) ×6 IMPLANT
SUT ETHILON 2 0 PS N (SUTURE) IMPLANT
SUT MNCRL AB 4-0 PS2 18 (SUTURE) ×3 IMPLANT
SUT VICRYL 0 ENDOLOOP (SUTURE) IMPLANT
TAPE CLOTH 4X10 WHT NS (GAUZE/BANDAGES/DRESSINGS) IMPLANT
TOWEL OR 17X26 10 PK STRL BLUE (TOWEL DISPOSABLE) ×3 IMPLANT
TRAY LAPAROSCOPIC (CUSTOM PROCEDURE TRAY) ×3 IMPLANT
TROCAR BLADELESS OPT 5 100 (ENDOMECHANICALS) ×3 IMPLANT
TROCAR XCEL 12X100 BLDLESS (ENDOMECHANICALS) ×3 IMPLANT

## 2015-12-24 NOTE — Anesthesia Postprocedure Evaluation (Signed)
Anesthesia Post Note  Patient: Jon Boyd  Procedure(Boyd) Performed: Procedure(Boyd) (LRB): LAPAROSCOPIC CHOLECYSTECTOMY  (N/A) POSSIBLE LYMPHADENECTOMY (N/A)  Patient location during evaluation: PACU Anesthesia Type: General Level of consciousness: awake and alert Pain management: pain level controlled Vital Signs Assessment: post-procedure vital signs reviewed and stable Respiratory status: spontaneous breathing, nonlabored ventilation, respiratory function stable and patient connected to nasal cannula oxygen Cardiovascular status: blood pressure returned to baseline and stable Postop Assessment: no signs of nausea or vomiting Anesthetic complications: no    Last Vitals:  Filed Vitals:   12/24/15 1100 12/24/15 1115  BP: 114/59 111/60  Pulse: 77 80  Temp: 36.7 C   Resp: 15 18    Last Pain:  Filed Vitals:   12/24/15 1127  PainSc: 0-No pain                 Jon Boyd

## 2015-12-24 NOTE — Op Note (Signed)
Preoperative diagnosis: chronic cholecystitis  Postoperative diagnosis: Same   Procedure: laparoscopic cholecystectomy  Surgeon: Gurney Maxin, M.D.  Asst: Stark Klein, M.D.  Anesthesia: Gen.   Indications for procedure: Jon Boyd is a 55 y.o. male with symptoms of RUQ pain, Nausea and Vomiting consistent with gallbladder disease, Confirmed by CT.  Description of procedure: The patient was brought into the operative suite, placed supine. Anesthesia was administered with endotracheal tube. Patient was strapped in place and foot board was secured. All pressure points were offloaded by foam padding. The patient was prepped and draped in the usual sterile fashion.  A small incision was made to the right of the umbilicus. A 74mm trocar was inserted into the peritoneal cavity with optical entry. Pneumoperitoneum was applied with high flow low pressure. 2 100mm trocars were placed in the RUQ. A 81mm trocar was placed in the subxiphoid space. All trocars sites were first anesthesized with 0.25% marcaine with epinephrine in the subcutaneous and preperitoneal layers. Next the patient was placed in reverse trendelenberg. The gallbladder was retracted cephalad and lateral. The peritoneum was reflected off the infundibulum working lateral to medial.   There was a enlarged area of the fundus of the gallbladder but nothing to definitely denote a cancer. Multiple adhesions were removed from the gallbladder wall with cautery and blunt dissection. The liver was cirrhotic throughout with multiple regenerative nodules throughout making visualization of the cystic triangle difficult. The cystic duct and cystic artery were identified and further dissection revealed a critical view.  The cystic duct and cystic artery were doubly clipped and ligated.   The gallbladder was removed off the liver bed with cautery. The Gallbladder was placed in a specimen bag. The gallbladder fossa was irrigated and hemostasis was applied  with cautery. The gallbladder was removed via the 37mm trocar. The tract had to be dilated due remove the gallbladder. A 0 vicryl was used to close the fascia of this area using a suture passer.  The gallbladder was opened on the back table, no mass was seen in the area of concern from imaging.   The area was irrigated and cautery was used to stop bleeding. Arista was then used to ensure hemostasis by spreading it in that area. Pneumoperitoneum was removed, all trocar were removed. All incisions were closed with 4-0 monocryl subcuticular stitch. The patient woke from anesthesia and was brought to PACU in stable condition.  Findings: cirrhotic liver, inflamed gallbladder, no mass identified  Specimen: gallbldadder  Blood loss: 72ml  Local anesthesia: 25ml 0.25% marcaine w epi  Complications: none  Gurney Maxin, M.D. General, Bariatric, & Minimally Invasive Surgery [facility]

## 2015-12-24 NOTE — Transfer of Care (Signed)
Immediate Anesthesia Transfer of Care Note  Patient: Jon Boyd  Procedure(s) Performed: Procedure(s): LAPAROSCOPIC CHOLECYSTECTOMY  (N/A) POSSIBLE LYMPHADENECTOMY (N/A)  Patient Location: PACU  Anesthesia Type:General  Level of Consciousness:  sedated, patient cooperative and responds to stimulation  Airway & Oxygen Therapy:Patient Spontanous Breathing and Patient connected to face mask oxgen  Post-op Assessment:  Report given to PACU RN and Post -op Vital signs reviewed and stable  Post vital signs:  Reviewed and stable  Last Vitals:  Filed Vitals:   12/24/15 0628  BP: 141/70  Pulse: 66  Temp: 36.7 C  Resp: 18    Complications: No apparent anesthesia complications

## 2015-12-24 NOTE — Anesthesia Procedure Notes (Signed)

## 2015-12-24 NOTE — H&P (Addendum)
Jon Boyd is an 55 y.o. male.   Chief Complaint: abdominal pain HPI: 55 yo male with abdominal pain who on work up was found to have cholelithiasis and a 79mm tumor on the fundus of the gallbladder. He has had 35 pounds weight loss in last 2 years. He denies changes in bowel habits.  Past Medical History  Diagnosis Date  . Hypertension   . Chronic headaches   . Hyperlipidemia   . Stroke ([place])   . Postablative hypothyroidism   . Edema 06/03/2014    Past Surgical History  Procedure Laterality Date  . Back surgery    . Ankle surgery    . Finger surgery  09/2012    gramig  . Colonoscopy    . Polypectomy      Family History  Problem Relation Age of Onset  . COPD Mother   . Tremor Mother   . Hyperthyroidism Sister   . Colon cancer Father 51  . Colon polyps Father    Social History:  reports that he has been smoking Cigarettes.  He has a 17.5 pack-year smoking history. He has never used smokeless tobacco. He reports that he does not drink alcohol or use illicit drugs.  Allergies: No Known Allergies  Medications Prior to Admission  Medication Sig Dispense Refill  . aspirin 81 MG chewable tablet Chew 81 mg by mouth daily.      Marland Kitchen atorvastatin (LIPITOR) 20 MG tablet Take 1 tablet (20 mg total) by mouth daily at 6 PM. 90 tablet 3  . lactulose (CHRONULAC) 10 GM/15ML solution Take 15 mLs (10 g total) by mouth 3 (three) times daily. 473 mL 1  . levothyroxine (LEVOTHROID) 25 MCG tablet Take 1 tablet (25 mcg total) by mouth daily before breakfast. Take with 268mcg for total 289mcg daily 90 tablet 1  . levothyroxine (SYNTHROID, LEVOTHROID) 200 MCG tablet Take 1 tablet (200 mcg total) by mouth daily before breakfast. 90 tablet 4  . losartan (COZAAR) 50 MG tablet Take 1 tablet (50 mg total) by mouth daily. 90 tablet 3  . metoprolol succinate (TOPROL-XL) 25 MG 24 hr tablet Take 1 tablet (25 mg total) by mouth daily. 90 tablet 3  . sildenafil (VIAGRA) 100 MG tablet Take 0.5-1 tablets (50-100  mg total) by mouth daily as needed for erectile dysfunction. 10 tablet 3    No results found for this or any previous visit (from the past 48 hour(s)). No results found.  Review of Systems  Constitutional: Negative for fever and chills.  HENT: Negative for hearing loss.   Eyes: Negative for blurred vision and double vision.  Respiratory: Negative for cough and hemoptysis.   Cardiovascular: Negative for chest pain and palpitations.  Gastrointestinal: Positive for abdominal pain. Negative for nausea and vomiting.  Genitourinary: Negative for dysuria and urgency.  Musculoskeletal: Negative for myalgias and neck pain.  Skin: Negative for itching and rash.  Neurological: Negative for dizziness, tingling and headaches.  Endo/Heme/Allergies: Does not bruise/bleed easily.  Psychiatric/Behavioral: Negative for depression and suicidal ideas.    Blood pressure 141/70, pulse 66, temperature 98.1 F (36.7 C), temperature source Oral, resp. rate 18, height 5\' 11"  (1.803 m), weight 86.183 kg (190 lb), SpO2 100 %. Physical Exam  Constitutional: He is oriented to person, place, and time. He appears well-developed and well-nourished.  HENT:  Head: Normocephalic and atraumatic.  Eyes: Conjunctivae are normal. Pupils are equal, round, and reactive to light.  Neck: Normal range of motion. Neck supple.  Cardiovascular: Normal rate  and regular rhythm.   Respiratory: Breath sounds normal.  GI: Soft. He exhibits no distension and no mass. There is no tenderness. There is no rebound.  Musculoskeletal: He exhibits no edema or tenderness.  Neurological: He is alert and oriented to person, place, and time.  Skin: Skin is warm and dry.  Psychiatric: He has a normal mood and affect. His behavior is normal.     Assessment/Plan 55 yo male with abdominal pain with concerning mass on gallbladder -plan for laparoscopic cholecystectomy, if mass if apparent will continue with oncologic procedure. Patient showed  understanding and agreed to proceed.  Mickeal Skinner, MD 12/24/2015, 8:18 AM

## 2015-12-24 NOTE — Anesthesia Preprocedure Evaluation (Addendum)
Anesthesia Evaluation  Patient identified by MRN, date of birth, ID band Patient awake    Reviewed: Allergy & Precautions, NPO status , Patient's Chart, lab work & pertinent test results  Airway Mallampati: II  TM Distance: >3 FB Neck ROM: Full    Dental no notable dental hx.    Pulmonary neg pulmonary ROS, Current Smoker,    Pulmonary exam normal breath sounds clear to auscultation       Cardiovascular hypertension, Pt. on medications and Pt. on home beta blockers Normal cardiovascular exam Rhythm:Regular Rate:Normal     Neuro/Psych CVA negative psych ROS   GI/Hepatic negative GI ROS, (+) Cirrhosis   ascites    ,   Endo/Other  Hypothyroidism   Renal/GU negative Renal ROS  negative genitourinary   Musculoskeletal negative musculoskeletal ROS (+)   Abdominal   Peds negative pediatric ROS (+)  Hematology  (+) anemia , thrombocytopenia   Anesthesia Other Findings   Reproductive/Obstetrics negative OB ROS                           Anesthesia Physical Anesthesia Plan  ASA: III  Anesthesia Plan: General   Post-op Pain Management:    Induction: Intravenous  Airway Management Planned: Oral ETT  Additional Equipment:   Intra-op Plan:   Post-operative Plan: Possible Post-op intubation/ventilation and Extubation in OR  Informed Consent: I have reviewed the patients History and Physical, chart, labs and discussed the procedure including the risks, benefits and alternatives for the proposed anesthesia with the patient or authorized representative who has indicated his/her understanding and acceptance.   Dental advisory given  Plan Discussed with: CRNA  Anesthesia Plan Comments: (If it becomes necessary to perform procedure in open fashion, will transfuse platelets)        Anesthesia Quick Evaluation

## 2015-12-25 DIAGNOSIS — K811 Chronic cholecystitis: Secondary | ICD-10-CM | POA: Diagnosis not present

## 2015-12-25 LAB — BASIC METABOLIC PANEL
Anion gap: 8 (ref 5–15)
BUN: 8 mg/dL (ref 6–20)
CO2: 24 mmol/L (ref 22–32)
Calcium: 8 mg/dL — ABNORMAL LOW (ref 8.9–10.3)
Chloride: 107 mmol/L (ref 101–111)
Creatinine, Ser: 0.84 mg/dL (ref 0.61–1.24)
GFR calc Af Amer: >60 mL/min (ref >60)
Glucose, Bld: 96 mg/dL (ref 65–99)
POTASSIUM: 3.6 mmol/L (ref 3.5–5.1)
SODIUM: 139 mmol/L (ref 135–145)

## 2015-12-25 LAB — CBC
HCT: 31.6 % — ABNORMAL LOW (ref 39.0–52.0)
Hemoglobin: 10.8 g/dL — ABNORMAL LOW (ref 13.0–17.0)
MCH: 33.5 pg (ref 26.0–34.0)
MCHC: 34.2 g/dL (ref 30.0–36.0)
MCV: 98.1 fL (ref 78.0–100.0)
PLATELETS: 78 10*3/uL — AB (ref 150–400)
RBC: 3.22 MIL/uL — AB (ref 4.22–5.81)
RDW: 21.2 % — ABNORMAL HIGH (ref 11.5–15.5)
WBC: 6 10*3/uL (ref 4.0–10.5)

## 2015-12-25 MED ORDER — OXYCODONE HCL 10 MG PO TABS: 10.0000 mg | ORAL_TABLET | ORAL | Status: DC | PRN

## 2015-12-25 NOTE — Discharge Summary (Signed)
Physician Discharge Summary  Patient ID: Jon Boyd MRN: [id number] DOB/AGE: 02/21/1961 55 y.o.  Admit date: 12/24/2015 Discharge date: 12/25/2015  Admission Diagnoses:  Discharge Diagnoses:  Active Problems:   Chronic cholecystitis with calculus   Discharged Condition: good  Hospital Course: the patient was observed overnight after laparoscopic cholecystectomy given his history of cirrhosis. He did well as able ambulate postoperative day 1 and tolerating a regular diet.  His pain was controlled with oral medications.  Consults: None  Significant Diagnostic Studies: labs: plt 78  Treatments: laparoscopic cholecystectomy  Discharge Exam: Blood pressure 117/64, pulse 68, temperature 98.3 F (36.8 C), temperature source Oral, resp. rate 18, height 5\' 11"  (1.803 m), weight 86.183 kg (190 lb), SpO2 95 %. General appearance: alert and cooperative Neck: no adenopathy, no carotid bruit, no JVD, supple, symmetrical, trachea midline and thyroid not enlarged, symmetric, no tenderness/mass/nodules Resp: clear to auscultation bilaterally Cardio: regular rate and rhythm, S1, S2 normal, no murmur, click, rub or gallop GI: soft, non-tender; bowel sounds normal; no masses,  no organomegaly and incisions c/d/i  Disposition: 01-Home or Self Care  Discharge Instructions    Call MD for:  persistant nausea and vomiting    Complete by:  As directed      Call MD for:  redness, tenderness, or signs of infection (pain, swelling, redness, odor or green/yellow discharge around incision site)    Complete by:  As directed      Call MD for:  severe uncontrolled pain    Complete by:  As directed      Call MD for:  temperature >100.4    Complete by:  As directed      Diet - low sodium heart healthy    Complete by:  As directed      Increase activity slowly    Complete by:  As directed      Lifting restrictions    Complete by:  As directed   Slowly advance activity over the next 3 weeks            Medication List    TAKE these medications        aspirin 81 MG chewable tablet  Chew 81 mg by mouth daily.     atorvastatin 20 MG tablet  Commonly known as:  LIPITOR  Take 1 tablet (20 mg total) by mouth daily at 6 PM.     lactulose 10 GM/15ML solution  Commonly known as:  CHRONULAC  Take 15 mLs (10 g total) by mouth 3 (three) times daily.     levothyroxine 200 MCG tablet  Commonly known as:  SYNTHROID, LEVOTHROID  Take 1 tablet (200 mcg total) by mouth daily before breakfast.     levothyroxine 25 MCG tablet  Commonly known as:  LEVOTHROID  Take 1 tablet (25 mcg total) by mouth daily before breakfast. Take with 262mcg for total 234mcg daily     losartan 50 MG tablet  Commonly known as:  COZAAR  Take 1 tablet (50 mg total) by mouth daily.     metoprolol succinate 25 MG 24 hr tablet  Commonly known as:  TOPROL-XL  Take 1 tablet (25 mg total) by mouth daily.     Oxycodone HCl 10 MG Tabs  Take 1 tablet (10 mg total) by mouth every 4 (four) hours as needed (for pain).     sildenafil 100 MG tablet  Commonly known as:  VIAGRA  Take 0.5-1 tablets (50-100 mg total) by mouth daily as needed for erectile  dysfunction.         Signed: Arta Bruce [name] 12/25/2015, 1:32 PM

## 2015-12-28 LAB — TYPE AND SCREEN
ABO/RH(D): O POS
ANTIBODY SCREEN: NEGATIVE
UNIT DIVISION: 0

## 2016-02-01 ENCOUNTER — Emergency Department (HOSPITAL_COMMUNITY): Payer: Commercial Managed Care - PPO

## 2016-02-01 ENCOUNTER — Encounter (HOSPITAL_COMMUNITY): Payer: Self-pay | Admitting: *Deleted

## 2016-02-01 ENCOUNTER — Emergency Department (HOSPITAL_COMMUNITY)
Admission: EM | Admit: 2016-02-01 | Discharge: 2016-02-03 | Disposition: A | Discharge status: Other Acute Inpatient Hospital | Payer: Commercial Managed Care - PPO | Attending: Emergency Medicine | Admitting: Emergency Medicine

## 2016-02-01 DIAGNOSIS — R0789 Other chest pain: Secondary | ICD-10-CM | POA: Diagnosis not present

## 2016-02-01 DIAGNOSIS — G8929 Other chronic pain: Secondary | ICD-10-CM | POA: Diagnosis not present

## 2016-02-01 DIAGNOSIS — F17210 Nicotine dependence, cigarettes, uncomplicated: Secondary | ICD-10-CM | POA: Diagnosis not present

## 2016-02-01 DIAGNOSIS — E890 Postprocedural hypothyroidism: Secondary | ICD-10-CM | POA: Insufficient documentation

## 2016-02-01 DIAGNOSIS — I10 Essential (primary) hypertension: Secondary | ICD-10-CM | POA: Diagnosis not present

## 2016-02-01 DIAGNOSIS — F1010 Alcohol abuse, uncomplicated: Secondary | ICD-10-CM | POA: Insufficient documentation

## 2016-02-01 DIAGNOSIS — Z8673 Personal history of transient ischemic attack (TIA), and cerebral infarction without residual deficits: Secondary | ICD-10-CM | POA: Diagnosis not present

## 2016-02-01 DIAGNOSIS — E785 Hyperlipidemia, unspecified: Secondary | ICD-10-CM | POA: Insufficient documentation

## 2016-02-01 DIAGNOSIS — Z79899 Other long term (current) drug therapy: Secondary | ICD-10-CM | POA: Insufficient documentation

## 2016-02-01 DIAGNOSIS — F22 Delusional disorders: Secondary | ICD-10-CM | POA: Diagnosis present

## 2016-02-01 DIAGNOSIS — Z7982 Long term (current) use of aspirin: Secondary | ICD-10-CM | POA: Insufficient documentation

## 2016-02-01 DIAGNOSIS — F419 Anxiety disorder, unspecified: Secondary | ICD-10-CM | POA: Insufficient documentation

## 2016-02-01 LAB — CBC
HEMATOCRIT: 37.1 % — AB (ref 39.0–52.0)
Hemoglobin: 13.5 g/dL (ref 13.0–17.0)
MCH: 33.3 pg (ref 26.0–34.0)
MCHC: 36.4 g/dL — ABNORMAL HIGH (ref 30.0–36.0)
MCV: 91.6 fL (ref 78.0–100.0)
PLATELETS: 101 10*3/uL — AB (ref 150–400)
RBC: 4.05 MIL/uL — ABNORMAL LOW (ref 4.22–5.81)
RDW: 16.7 % — AB (ref 11.5–15.5)
WBC: 9.4 10*3/uL (ref 4.0–10.5)

## 2016-02-01 LAB — COMPREHENSIVE METABOLIC PANEL
ALBUMIN: 3.2 g/dL — AB (ref 3.5–5.0)
ALT: 27 U/L (ref 17–63)
ANION GAP: 14 (ref 5–15)
AST: 53 U/L — ABNORMAL HIGH (ref 15–41)
Alkaline Phosphatase: 137 U/L — ABNORMAL HIGH (ref 38–126)
BILIRUBIN TOTAL: 1.7 mg/dL — AB (ref 0.3–1.2)
BUN: 8 mg/dL (ref 6–20)
CO2: 16 mmol/L — ABNORMAL LOW (ref 22–32)
Calcium: 9.1 mg/dL (ref 8.9–10.3)
Chloride: 100 mmol/L — ABNORMAL LOW (ref 101–111)
Creatinine, Ser: 1.36 mg/dL — ABNORMAL HIGH (ref 0.61–1.24)
GFR calc Af Amer: >60 mL/min (ref >60)
GFR calc non Af Amer: 58 mL/min — ABNORMAL LOW (ref >60)
GLUCOSE: 120 mg/dL — AB (ref 65–99)
POTASSIUM: 3.6 mmol/L (ref 3.5–5.1)
SODIUM: 130 mmol/L — AB (ref 135–145)
TOTAL PROTEIN: 7.6 g/dL (ref 6.5–8.1)

## 2016-02-01 LAB — ACETAMINOPHEN LEVEL

## 2016-02-01 LAB — AMMONIA: Ammonia: 42 umol/L — ABNORMAL HIGH (ref 9–35)

## 2016-02-01 LAB — RAPID URINE DRUG SCREEN, HOSP PERFORMED
Amphetamines: NOT DETECTED
BENZODIAZEPINES: NOT DETECTED
Barbiturates: NOT DETECTED
COCAINE: NOT DETECTED
OPIATES: NOT DETECTED
Tetrahydrocannabinol: NOT DETECTED

## 2016-02-01 LAB — SALICYLATE LEVEL

## 2016-02-01 LAB — ETHANOL: Alcohol, Ethyl (B): 83 mg/dL — ABNORMAL HIGH (ref <5)

## 2016-02-01 MED ORDER — LORAZEPAM 1 MG PO TABS
1.0000 mg | ORAL_TABLET | Freq: Once | ORAL | Status: AC
  Administered 2016-02-01: 1 mg via ORAL
  Filled 2016-02-01: qty 1

## 2016-02-01 NOTE — BH Assessment (Signed)
Clinician attempted to engage pt in TTS Consult however, pt was sleeping and unable to arouse. Clinician informed by RN that pt was administered ativan. Clinician requested consult to be re-entered once pt was up and able to participate.

## 2016-02-01 NOTE — ED Notes (Signed)
Daughters provided pt with sub from [facility], per MD. Pt now resting comfortably in bed and appears more relaxed and less anxious. MD made aware.

## 2016-02-01 NOTE — ED Notes (Signed)
TTS called to do assessment however pt unable to stay awake at this time. [place] states they will call back.

## 2016-02-01 NOTE — ED Notes (Signed)
Nicole Kindred (brother) [phone]

## 2016-02-01 NOTE — ED Notes (Signed)
Staffing aware of need for sitter, Charge Rn aware that staffing does not have sitters available at this time, security called to wand pt

## 2016-02-01 NOTE — ED Notes (Signed)
Patient's daughters:  [name] [phone] [place] [phone] ** call if patient is to be discharged **

## 2016-02-01 NOTE — ED Notes (Signed)
Pt's daughters arrived and spoke with MD regarding pt's hx - per daughters, similar situation happened around [place] Years this year and pt's ammonia level was up. Daughters sitting at pt's bedside, pt appears to be more calm at this time.

## 2016-02-01 NOTE — ED Notes (Signed)
Pt informed that his mother is here to visit him, pt states, "I do  Not want anyone to visit me. I don't know how anyone knows I am here." Production designer, theatre/television/film to inform pts mother that he does not want any visitors
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
## 2016-02-01 NOTE — ED Notes (Signed)
MD at bedside. 

## 2016-02-02 ENCOUNTER — Encounter (HOSPITAL_COMMUNITY): Payer: Self-pay | Admitting: Emergency Medicine

## 2016-02-02 MED ORDER — LACTULOSE 10 GM/15ML PO SOLN
10.0000 g | Freq: Three times a day (TID) | ORAL | Status: DC
Start: 2016-02-02 — End: 2016-02-03
  Administered 2016-02-02 – 2016-02-03 (×3): 10 g via ORAL
  Filled 2016-02-02 (×3): qty 15

## 2016-02-02 MED ORDER — ASPIRIN 81 MG PO CHEW
81.0000 mg | CHEWABLE_TABLET | Freq: Every day | ORAL | Status: DC
  Administered 2016-02-02 – 2016-02-03 (×2): 81 mg via ORAL
  Filled 2016-02-02 (×2): qty 1

## 2016-02-02 MED ORDER — THIAMINE HCL 100 MG/ML IJ SOLN
100.0000 mg | Freq: Every day | INTRAMUSCULAR | Status: DC
  Administered 2016-02-02: 100 mg via INTRAVENOUS
  Filled 2016-02-02: qty 2

## 2016-02-02 MED ORDER — LORAZEPAM 1 MG PO TABS
0.0000 mg | ORAL_TABLET | Freq: Four times a day (QID) | ORAL | Status: DC
  Administered 2016-02-02: 2 mg via ORAL
  Administered 2016-02-02: 1 mg via ORAL
  Filled 2016-02-02: qty 1
  Filled 2016-02-02: qty 2

## 2016-02-02 MED ORDER — SODIUM CHLORIDE 0.9 % IV BOLUS (SEPSIS)
1000.0000 mL | Freq: Once | INTRAVENOUS | Status: AC
  Administered 2016-02-02: 1000 mL via INTRAVENOUS

## 2016-02-02 MED ORDER — VITAMIN B-1 100 MG PO TABS
100.0000 mg | ORAL_TABLET | Freq: Every day | ORAL | Status: DC
  Administered 2016-02-03: 100 mg via ORAL
  Filled 2016-02-02 (×2): qty 1

## 2016-02-02 MED ORDER — METOPROLOL SUCCINATE ER 25 MG PO TB24
25.0000 mg | ORAL_TABLET | Freq: Every day | ORAL | Status: DC
  Administered 2016-02-03: 25 mg via ORAL
  Filled 2016-02-02 (×2): qty 1

## 2016-02-02 MED ORDER — LOSARTAN POTASSIUM 50 MG PO TABS
50.0000 mg | ORAL_TABLET | Freq: Every day | ORAL | Status: DC
  Administered 2016-02-03: 50 mg via ORAL
  Filled 2016-02-02 (×2): qty 1

## 2016-02-02 MED ORDER — LORAZEPAM 1 MG PO TABS: 1.0000 mg | ORAL_TABLET | Freq: Three times a day (TID) | ORAL | Status: DC | PRN

## 2016-02-02 MED ORDER — LORAZEPAM 1 MG PO TABS: 0.0000 mg | ORAL_TABLET | Freq: Two times a day (BID) | ORAL | Status: DC

## 2016-02-02 MED ORDER — ONDANSETRON HCL 4 MG PO TABS: 4.0000 mg | ORAL_TABLET | Freq: Three times a day (TID) | ORAL | Status: DC | PRN

## 2016-02-02 MED ORDER — ATORVASTATIN CALCIUM 10 MG PO TABS
20.0000 mg | ORAL_TABLET | Freq: Every day | ORAL | Status: DC
  Administered 2016-02-02: 20 mg via ORAL
  Filled 2016-02-02: qty 2

## 2016-02-02 MED ORDER — LEVOTHYROXINE SODIUM 200 MCG PO TABS
200.0000 ug | ORAL_TABLET | Freq: Every day | ORAL | Status: DC
  Administered 2016-02-03: 200 ug via ORAL
  Filled 2016-02-02: qty 1

## 2016-02-02 MED ORDER — LEVOTHYROXINE SODIUM 25 MCG PO TABS
25.0000 ug | ORAL_TABLET | Freq: Every day | ORAL | Status: DC
  Administered 2016-02-03: 25 ug via ORAL
  Filled 2016-02-02: qty 1

## 2016-02-02 MED ORDER — ZOLPIDEM TARTRATE 5 MG PO TABS: 5.0000 mg | ORAL_TABLET | Freq: Every evening | ORAL | Status: DC | PRN

## 2016-02-02 NOTE — ED Notes (Signed)
Family visiting with pt at bedside.

## 2016-02-02 NOTE — ED Notes (Signed)
Patient's belongings in bags, pt in wine-colored scrubs.

## 2016-02-02 NOTE — ED Notes (Signed)
Pt refusing vital signs

## 2016-02-02 NOTE — BHH Counselor (Signed)
Clinician contacted pt RN Jerene Pitch) upon TTS Consult being re-ordered. Clinician verified that pt was asleep and still unable to participate in consult. Clinician requested that 1st and 2nd consult be removed and re-ordered once pt was able to remain aroused. RN consulted with MD. RN communicated to this writer that consult was unable to be removed. This Probation officer explained to RN that consults are timed and requested consult orders be place when pt able to participate.

## 2016-02-02 NOTE — ED Notes (Signed)
TTS eval being done in room.

## 2016-02-02 NOTE — Progress Notes (Signed)
Referred pt for inpatient psych treatment at TTS recommendation.  Referred to: [facility]- per [facility]- per [facility]- per [facility]- per Lollie Marrow  Left voicemails for [place] and [place], paged [facility]; will refer if any have bed availability.  Cristal Ford, [place] advised currently at capacity but to call back after 3pm in case of d/cs.  Sharren Bridge, MSW, LCSW Clinical Social Work, Disposition  02/02/2016 [phone]

## 2016-02-02 NOTE — ED Notes (Signed)
TTS called again to complete psych assessment - patient still unable to remain awake for conversation. [place] asked if MD could take out TTS order and re-enter it when pt is able to complete assessment. Order remains in, [facility] to try again soon. MD aware.
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
## 2016-02-02 NOTE — ED Notes (Signed)
Daughter [name] [phone] H [phone].

## 2016-02-03 NOTE — Progress Notes (Signed)
Pt accepted to [facility] by Dr Dareen Piano, can arrive after 9am this morning per Amy in intake. Report # is [phone].  Called pt's daughter [name] [phone]. She is aware of pending transfer and has contact information for [employer]. Requested to speak with pt's nurse; CSW provided number for [facility].   Sharren Bridge, MSW, LCSW Clinical Social Work, Disposition  02/03/2016 [phone]

## 2016-02-03 NOTE — ED Notes (Signed)
Labeled belonging bags x 2 removed from pt's room - placed at nurses's desk.

## 2016-02-03 NOTE — ED Notes (Signed)
Pt spoke w/his daughter Marye Round [phone]

## 2016-02-19 ENCOUNTER — Ambulatory Visit (INDEPENDENT_AMBULATORY_CARE_PROVIDER_SITE_OTHER): Payer: Commercial Managed Care - PPO | Admitting: Internal Medicine

## 2016-02-19 ENCOUNTER — Other Ambulatory Visit (INDEPENDENT_AMBULATORY_CARE_PROVIDER_SITE_OTHER): Payer: Commercial Managed Care - PPO

## 2016-02-19 ENCOUNTER — Telehealth: Payer: Self-pay

## 2016-02-19 ENCOUNTER — Encounter: Payer: Self-pay | Admitting: Internal Medicine

## 2016-02-19 VITALS — BP 118/70 | HR 75 | Temp 97.9°F | Resp 16 | Ht 71.0 in | Wt 204.0 lb

## 2016-02-19 DIAGNOSIS — F1097 Alcohol use, unspecified with alcohol-induced persisting dementia: Secondary | ICD-10-CM

## 2016-02-19 DIAGNOSIS — D510 Vitamin B12 deficiency anemia due to intrinsic factor deficiency: Secondary | ICD-10-CM

## 2016-02-19 DIAGNOSIS — I10 Essential (primary) hypertension: Secondary | ICD-10-CM

## 2016-02-19 DIAGNOSIS — F1027 Alcohol dependence with alcohol-induced persisting dementia: Secondary | ICD-10-CM

## 2016-02-19 DIAGNOSIS — D509 Iron deficiency anemia, unspecified: Secondary | ICD-10-CM

## 2016-02-19 DIAGNOSIS — E781 Pure hyperglyceridemia: Secondary | ICD-10-CM

## 2016-02-19 DIAGNOSIS — R739 Hyperglycemia, unspecified: Secondary | ICD-10-CM | POA: Diagnosis not present

## 2016-02-19 DIAGNOSIS — E038 Other specified hypothyroidism: Secondary | ICD-10-CM

## 2016-02-19 DIAGNOSIS — E559 Vitamin D deficiency, unspecified: Secondary | ICD-10-CM

## 2016-02-19 DIAGNOSIS — F1021 Alcohol dependence, in remission: Secondary | ICD-10-CM

## 2016-02-19 LAB — CBC WITH DIFFERENTIAL/PLATELET
BASOS ABS: 0 10*3/uL (ref 0.0–0.1)
BASOS PCT: 0.4 % (ref 0.0–3.0)
EOS ABS: 0.1 10*3/uL (ref 0.0–0.7)
Eosinophils Relative: 3.1 % (ref 0.0–5.0)
HEMATOCRIT: 35.4 % — AB (ref 39.0–52.0)
HEMOGLOBIN: 12 g/dL — AB (ref 13.0–17.0)
LYMPHS PCT: 32.7 % (ref 12.0–46.0)
Lymphs Abs: 1.6 10*3/uL (ref 0.7–4.0)
MCHC: 33.8 g/dL (ref 30.0–36.0)
MCV: 94.4 fl (ref 78.0–100.0)
Monocytes Absolute: 0.7 10*3/uL (ref 0.1–1.0)
Monocytes Relative: 14 % — ABNORMAL HIGH (ref 3.0–12.0)
Neutro Abs: 2.4 10*3/uL (ref 1.4–7.7)
Neutrophils Relative %: 49.8 % (ref 43.0–77.0)
Platelets: 105 10*3/uL — ABNORMAL LOW (ref 150.0–400.0)
RBC: 3.74 Mil/uL — ABNORMAL LOW (ref 4.22–5.81)
RDW: 17.8 % — AB (ref 11.5–15.5)
WBC: 4.9 10*3/uL (ref 4.0–10.5)

## 2016-02-19 LAB — COMPREHENSIVE METABOLIC PANEL
ALBUMIN: 3.1 g/dL — AB (ref 3.5–5.2)
ALT: 21 U/L (ref 0–53)
AST: 41 U/L — AB (ref 0–37)
Alkaline Phosphatase: 167 U/L — ABNORMAL HIGH (ref 39–117)
BUN: 8 mg/dL (ref 6–23)
CALCIUM: 9.1 mg/dL (ref 8.4–10.5)
CHLORIDE: 110 meq/L (ref 96–112)
CO2: 25 mEq/L (ref 19–32)
CREATININE: 0.68 mg/dL (ref 0.40–1.50)
GFR: 128.68 mL/min (ref >60.00)
Glucose, Bld: 126 mg/dL — ABNORMAL HIGH (ref 70–99)
POTASSIUM: 3.7 meq/L (ref 3.5–5.1)
Sodium: 141 mEq/L (ref 135–145)
Total Bilirubin: 1.3 mg/dL — ABNORMAL HIGH (ref 0.2–1.2)
Total Protein: 6.6 g/dL (ref 6.0–8.3)

## 2016-02-19 LAB — LIPID PANEL
CHOL/HDL RATIO: 3
Cholesterol: 115 mg/dL (ref 0–200)
HDL: 44.6 mg/dL (ref >39.00)
LDL CALC: 59 mg/dL (ref 0–99)
NonHDL: 70.29
TRIGLYCERIDES: 58 mg/dL (ref 0.0–149.0)
VLDL: 11.6 mg/dL (ref 0.0–40.0)

## 2016-02-19 LAB — TSH: TSH: 0.22 u[IU]/mL — ABNORMAL LOW (ref 0.35–4.50)

## 2016-02-19 LAB — IBC PANEL
Iron: 37 ug/dL — ABNORMAL LOW (ref 42–165)
SATURATION RATIOS: 8.8 % — AB (ref 20.0–50.0)
Transferrin: 301 mg/dL (ref 212.0–360.0)

## 2016-02-19 LAB — HEMOGLOBIN A1C: Hgb A1c MFr Bld: 6 % (ref 4.6–6.5)

## 2016-02-19 LAB — FERRITIN: FERRITIN: 23.6 ng/mL (ref 22.0–322.0)

## 2016-02-19 LAB — FOLATE: FOLATE: 13.7 ng/mL (ref >5.9)

## 2016-02-19 LAB — VITAMIN D 25 HYDROXY (VIT D DEFICIENCY, FRACTURES): VITD: 15.83 ng/mL — AB (ref 30.00–100.00)

## 2016-02-19 LAB — VITAMIN B12: Vitamin B-12: 379 pg/mL (ref 211–911)

## 2016-02-19 MED ORDER — LEVOTHYROXINE SODIUM 175 MCG PO TABS: 175.0000 ug | ORAL_TABLET | Freq: Every day | ORAL | Status: DC

## 2016-02-19 MED ORDER — FERRALET 90 90-1 MG PO TABS: 1.0000 | ORAL_TABLET | Freq: Every day | ORAL | Status: DC

## 2016-02-19 MED ORDER — CHOLECALCIFEROL 1.25 MG (50000 UT) PO TABS: 1.0000 | ORAL_TABLET | ORAL | Status: DC

## 2016-02-19 NOTE — Telephone Encounter (Signed)
Called and verified Synthroid dosage pt states he is supposed to take 258mcg of medication he only takes the 200 has been out of the 25. He has been taking the 200 for a while. He would like me to call him with the new dosage instructions. Also informed him of the vitamins that were sent to the pharmacy as well

## 2016-02-19 NOTE — Progress Notes (Signed)
Pre visit review using our clinic review tool, if applicable. No additional management support is needed unless otherwise documented below in the visit note. 

## 2016-02-19 NOTE — Telephone Encounter (Signed)
He should be on only 200 mcg daily per our last visit unless Dr. Ronnald Ramp makes changes.

## 2016-02-19 NOTE — Progress Notes (Signed)
Subjective:  Patient ID: Jon Boyd, male    DOB: 1961-06-30  Age: 55 y.o. MRN: [id number]  CC: Hypothyroidism and Anemia  NEW TO ME  HPI Jon Boyd presents for follow-up after a recent inpatient detox at [facility] in [place]. He tells me that he has consumed alcohol for about 30 years. He has started to develop some medical consequences over the last few years and months so he entered into an inpatient program 10 days ago and had a successful detox. He tells me he is attending AA and NA meetings and has signed up to do some ongoing therapy at [facility]. He needs paperwork completed today for disability and for leave of absence from his work. He tells me he is starting to feel better but still has episodes of forgetfulness and lack of concentration.  Outpatient Prescriptions Prior to Visit  Medication Sig Dispense Refill  . aspirin 81 MG chewable tablet Chew 81 mg by mouth daily.      Marland Kitchen atorvastatin (LIPITOR) 20 MG tablet Take 1 tablet (20 mg total) by mouth daily at 6 PM. 90 tablet 3  . lactulose (CHRONULAC) 10 GM/15ML solution Take 15 mLs (10 g total) by mouth 3 (three) times daily. 473 mL 1  . losartan (COZAAR) 50 MG tablet Take 1 tablet (50 mg total) by mouth daily. 90 tablet 3  . metoprolol succinate (TOPROL-XL) 25 MG 24 hr tablet Take 1 tablet (25 mg total) by mouth daily. 90 tablet 3  . sildenafil (VIAGRA) 100 MG tablet Take 0.5-1 tablets (50-100 mg total) by mouth daily as needed for erectile dysfunction. 10 tablet 3  . levothyroxine (LEVOTHROID) 25 MCG tablet Take 1 tablet (25 mcg total) by mouth daily before breakfast. Take with 273mcg for total 224mcg daily 90 tablet 1  . levothyroxine (SYNTHROID, LEVOTHROID) 200 MCG tablet Take 1 tablet (200 mcg total) by mouth daily before breakfast. 90 tablet 4  . Oxycodone HCl 10 MG TABS Take 1 tablet (10 mg total) by mouth every 4 (four) hours as needed (for pain). 30 tablet 0   No facility-administered medications prior to visit.      ROS Review of Systems  Constitutional: Negative.  Negative for fever, chills, diaphoresis, appetite change and fatigue.  HENT: Negative.  Negative for facial swelling and trouble swallowing.   Eyes: Negative.   Respiratory: Negative.  Negative for cough, choking, chest tightness, shortness of breath and stridor.   Cardiovascular: Negative.  Negative for chest pain, palpitations and leg swelling.  Gastrointestinal: Negative.  Negative for nausea, vomiting, abdominal pain, diarrhea, constipation and blood in stool.  Endocrine: Negative.   Genitourinary: Negative.  Negative for dysuria, urgency, hematuria, flank pain and difficulty urinating.  Musculoskeletal: Negative.   Skin: Negative.  Negative for color change, pallor and rash.  Allergic/Immunologic: Negative.   Neurological: Negative.  Negative for dizziness, tremors, weakness, light-headedness, numbness and headaches.  Hematological: Negative.  Negative for adenopathy. Does not bruise/bleed easily.  Psychiatric/Behavioral: Positive for sleep disturbance and decreased concentration. Negative for suicidal ideas, behavioral problems, confusion, self-injury, dysphoric mood and agitation. The patient is not nervous/anxious.     Objective:  BP 118/70 mmHg  Pulse 75  Temp(Src) 97.9 F (36.6 C) (Oral)  Resp 16  Ht 5\' 11"  (1.803 m)  Wt 204 lb (92.534 kg)  BMI 28.46 kg/m2  SpO2 95%  BP Readings from Last 3 Encounters:  02/19/16 118/70  02/03/16 110/61  12/25/15 136/82    Wt Readings from Last 3 Encounters:  02/19/16 204 lb (92.534 kg)  02/01/16 191 lb 2 oz (86.694 kg)  12/24/15 190 lb (86.183 kg)    Physical Exam  Constitutional: He is oriented to person, place, and time. He appears well-developed and well-nourished. No distress.  HENT:  Head: Normocephalic and atraumatic.  Mouth/Throat: Oropharynx is clear and moist. No oropharyngeal exudate.  Eyes: Conjunctivae are normal. Right eye exhibits no discharge. Left eye  exhibits no discharge. No scleral icterus.  Neck: Normal range of motion. Neck supple. No JVD present. No tracheal deviation present. No thyromegaly present.  Cardiovascular: Normal rate, regular rhythm, normal heart sounds and intact distal pulses.  Exam reveals no gallop and no friction rub.   No murmur heard. Pulmonary/Chest: Effort normal and breath sounds normal. No stridor. No respiratory distress. He has no wheezes. He has no rales. He exhibits no tenderness.  Abdominal: Soft. Bowel sounds are normal. He exhibits no distension and no mass. There is no tenderness. There is no rebound and no guarding.  Musculoskeletal: Normal range of motion. He exhibits no edema or tenderness.  Lymphadenopathy:    He has no cervical adenopathy.  Neurological: He is alert and oriented to person, place, and time. He has normal reflexes. He displays normal reflexes. He exhibits normal muscle tone.  Skin: Skin is warm and dry. No rash noted. He is not diaphoretic. No erythema. No pallor.  Psychiatric: He has a normal mood and affect. Judgment and thought content normal. His mood appears not anxious. His affect is not angry, not blunt, not labile and not inappropriate. His speech is delayed and tangential. He is slowed. He is not agitated, not aggressive, not withdrawn, not actively hallucinating and not combative. Cognition and memory are impaired. He does not express impulsivity or inappropriate judgment. He does not exhibit a depressed mood. He expresses no homicidal and no suicidal ideation. He expresses no suicidal plans and no homicidal plans. He exhibits abnormal recent memory and abnormal remote memory. He is attentive.  Vitals reviewed.   Lab Results  Component Value Date   WBC 4.9 02/19/2016   HGB 12.0* 02/19/2016   HCT 35.4* 02/19/2016   PLT 105.0* 02/19/2016   GLUCOSE 126* 02/19/2016   CHOL 115 02/19/2016   TRIG 58.0 02/19/2016   HDL 44.60 02/19/2016   LDLDIRECT 51.0 04/07/2015   LDLCALC 59  02/19/2016   ALT 21 02/19/2016   AST 41* 02/19/2016   NA 141 02/19/2016   K 3.7 02/19/2016   CL 110 02/19/2016   CREATININE 0.68 02/19/2016   BUN 8 02/19/2016   CO2 25 02/19/2016   TSH 0.22* 02/19/2016   PSA 0.26 06/14/2011   INR 1.39 12/19/2015   HGBA1C 6.0 02/19/2016    Dg Ribs Unilateral W/chest Right  02/01/2016  CLINICAL DATA:  RIGHT-sided rib pain after fall today. History of hypertension, hyperlipidemia and stroke. EXAM: RIGHT RIBS AND CHEST - 3+ VIEW COMPARISON:  Chest radiograph October 08, 2011 FINDINGS: No fracture or other bone lesions are seen involving the ribs. There is no evidence of pneumothorax or pleural effusion. Both lungs are clear. Heart size and mediastinal contours are within normal limits. Please note, BB marker projects over RIGHT abdomen. IMPRESSION: Negative. Electronically Signed   By: Elon Alas M.D.   On: 02/01/2016 22:03   Ct Head Wo Contrast  02/01/2016  CLINICAL DATA:  Confusion EXAM: CT HEAD WITHOUT CONTRAST TECHNIQUE: Contiguous axial images were obtained from the base of the skull through the vertex without intravenous contrast. COMPARISON:  None. FINDINGS:  Bony calvarium is intact. No gross soft tissue abnormality is noted. No findings to suggest acute hemorrhage, acute infarction or space-occupying mass lesion are noted. IMPRESSION: No acute abnormality noted. Electronically Signed   By: Inez Catalina M.D.   On: 02/01/2016 21:28    Assessment & Plan:   Kenechi was seen today for hypothyroidism and anemia.  Diagnoses and all orders for this visit:  Essential hypertension- his blood pressure is well controlled on metoprolol -     Comprehensive metabolic panel; Future -     VITAMIN D 25 Hydroxy (Vit-D Deficiency, Fractures); Future  Hyperglycemia- his A1c is 6.0%, he is prediabetic, he agrees to work on his lifestyle modifications, no medications are needed at this time. -     Comprehensive metabolic panel; Future -     Hemoglobin A1c;  Future  Pernicious anemia- he reports no suspicious sources for blood loss and had a normal colonoscopy within the last year, he has iron deficiency so I will treat that, will also screen him for vit B deficiency. -     CBC with Differential/Platelet; Future -     Vitamin B12; Future -     IBC panel; Future -     Vitamin B1; Future -     Vitamin B6; Future -     VITAMIN D 25 Hydroxy (Vit-D Deficiency, Fractures); Future -     Ferritin; Future -     Folate; Future  Hypertriglyceridemia- improvement noted -     Lipid panel; Future  Other specified hypothyroidism- his TSH is suppressed so I'll decrease his Synthroid dose from 200 g a day 275 g a day -     TSH; Future -     levothyroxine (SYNTHROID) 175 MCG tablet; Take 1 tablet (175 mcg total) by mouth daily before breakfast.  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  Vitamin D deficiency- will start treating this with an oral vitamin D replacement -     Cholecalciferol 50000 units TABS; Take 1 tablet by mouth once a week.  I have discontinued Mr. Stallard levothyroxine, levothyroxine, and Oxycodone HCl. I am also having him start on FERRALET 90, Cholecalciferol, and levothyroxine. Additionally, I am having him maintain his aspirin, sildenafil, metoprolol succinate, atorvastatin, losartan, lactulose, zolpidem,  risperiDONE, acamprosate, and thiamine.  Meds ordered this encounter  Medications  . zolpidem (AMBIEN) 5 MG tablet    Sig: Take 5 mg by mouth at bedtime as needed for sleep.  . risperiDONE (RISPERDAL) 1 MG tablet    Sig: Take 1 mg by mouth at bedtime.  Marland Kitchen acamprosate (CAMPRAL) 333 MG tablet    Sig: Take 666 mg by mouth 3 (three) times daily with meals.  . thiamine (VITAMIN B-1) 100 MG tablet    Sig: Take 100 mg  by mouth daily.  Marland Kitchen Fe Cbn-Fe Gluc-FA-B12-C-DSS (FERRALET 90) 90-1 MG TABS    Sig: Take 1 tablet by mouth daily.    Dispense:  30 each    Refill:  5  . Cholecalciferol 50000 units TABS    Sig: Take 1 tablet by mouth once a week.    Dispense:  12 tablet    Refill:  3  . levothyroxine (SYNTHROID) 175 MCG tablet    Sig: Take 1 tablet (175 mcg total) by mouth daily before breakfast.    Dispense:  90 tablet    Refill:  1     Follow-up: Return in about 6 weeks (around 04/01/2016).  Scarlette Calico, MD

## 2016-02-19 NOTE — Patient Instructions (Signed)

## 2016-02-23 LAB — VITAMIN B6: VITAMIN B6: 4.7 ng/mL (ref 2.1–21.7)

## 2016-02-24 LAB — VITAMIN B1: VITAMIN B1 (THIAMINE): 90 nmol/L — AB (ref 8–30)

## 2016-02-25 ENCOUNTER — Encounter: Payer: Self-pay | Admitting: Internal Medicine

## 2016-03-02 ENCOUNTER — Ambulatory Visit (INDEPENDENT_AMBULATORY_CARE_PROVIDER_SITE_OTHER): Payer: Commercial Managed Care - PPO | Admitting: Internal Medicine

## 2016-03-02 ENCOUNTER — Encounter: Payer: Self-pay | Admitting: Internal Medicine

## 2016-03-02 VITALS — BP 120/70 | HR 78 | Temp 97.9°F | Ht 71.0 in | Wt 214.0 lb

## 2016-03-02 DIAGNOSIS — I10 Essential (primary) hypertension: Secondary | ICD-10-CM | POA: Diagnosis not present

## 2016-03-02 DIAGNOSIS — E034 Atrophy of thyroid (acquired): Secondary | ICD-10-CM

## 2016-03-02 DIAGNOSIS — E038 Other specified hypothyroidism: Secondary | ICD-10-CM | POA: Diagnosis not present

## 2016-03-02 DIAGNOSIS — R601 Generalized edema: Secondary | ICD-10-CM

## 2016-03-02 DIAGNOSIS — K7031 Alcoholic cirrhosis of liver with ascites: Secondary | ICD-10-CM | POA: Diagnosis not present

## 2016-03-02 DIAGNOSIS — R609 Edema, unspecified: Secondary | ICD-10-CM | POA: Insufficient documentation

## 2016-03-02 MED ORDER — SPIRONOLACTONE 50 MG PO TABS: 50.0000 mg | ORAL_TABLET | Freq: Every day | ORAL | Status: DC

## 2016-03-02 NOTE — Assessment & Plan Note (Signed)
5/17 B LEs due to liver cirrhosis Start Spironolactone po NAS diet

## 2016-03-02 NOTE — Patient Instructions (Signed)
Stop Losartan. Start Spironolactone Elevate legs

## 2016-03-02 NOTE — Progress Notes (Signed)
Pre visit review using our clinic review tool, if applicable. No additional management support is needed unless otherwise documented below in the visit note. 

## 2016-03-02 NOTE — Assessment & Plan Note (Signed)
On Levothroid 

## 2016-03-02 NOTE — Assessment & Plan Note (Signed)
5/17 ascitis Start Spironolactone po NAS diet

## 2016-03-02 NOTE — Progress Notes (Signed)
Subjective:  Patient ID: Jon Boyd, male    DOB: August 27, 1961  Age: 55 y.o. MRN: [id number]  CC: No chief complaint on file.   HPI BIJAN DAUZAT presents for leg swelling and abd swelling over past 3 days; feet feel tingly and cold. Pt quit alcohol 30 d ago. He is out of OP Rehab. He is attending [place] meetings.  Outpatient Prescriptions Prior to Visit  Medication Sig Dispense Refill  . acamprosate (CAMPRAL) 333 MG tablet Take 666 mg by mouth 3 (three) times daily with meals.    Marland Kitchen aspirin 81 MG chewable tablet Chew 81 mg by mouth daily.      Marland Kitchen atorvastatin (LIPITOR) 20 MG tablet Take 1 tablet (20 mg total) by mouth daily at 6 PM. 90 tablet 3  . Cholecalciferol 50000 units TABS Take 1 tablet by mouth once a week. 12 tablet 3  . levothyroxine (SYNTHROID) 175 MCG tablet Take 1 tablet (175 mcg total) by mouth daily before breakfast. 90 tablet 1  . losartan (COZAAR) 50 MG tablet Take 1 tablet (50 mg total) by mouth daily. 90 tablet 3  . metoprolol succinate (TOPROL-XL) 25 MG 24 hr tablet Take 1 tablet (25 mg total) by mouth daily. 90 tablet 3  . risperiDONE (RISPERDAL) 1 MG tablet Take 1 mg by mouth at bedtime.    . sildenafil (VIAGRA) 100 MG tablet Take 0.5-1 tablets (50-100 mg total) by mouth daily as needed for erectile dysfunction. 10 tablet 3  . thiamine (VITAMIN B-1) 100 MG tablet Take 100 mg by mouth daily.    Marland Kitchen Fe Cbn-Fe Gluc-FA-B12-C-DSS (FERRALET 90) 90-1 MG TABS Take 1 tablet by mouth daily. 30 each 5  . lactulose (CHRONULAC) 10 GM/15ML solution Take 15 mLs (10 g total) by mouth 3 (three) times daily. 473 mL 1  . zolpidem (AMBIEN) 5 MG tablet Take 5 mg by mouth at bedtime as needed for sleep.     No facility-administered medications prior to visit.    ROS Review of Systems  Constitutional: Positive for fatigue and unexpected weight change. Negative for appetite change.  HENT: Negative for congestion, nosebleeds, sneezing, sore throat and trouble swallowing.   Eyes: Negative for  itching and visual disturbance.  Respiratory: Negative for cough.   Cardiovascular: Positive for leg swelling. Negative for chest pain and palpitations.  Gastrointestinal: Negative for nausea, diarrhea, blood in stool, abdominal distention and rectal pain.  Genitourinary: Positive for decreased urine volume. Negative for frequency and hematuria.  Musculoskeletal: Positive for gait problem. Negative for back pain, joint swelling and neck pain.  Skin: Negative for rash.  Neurological: Negative for dizziness, tremors, speech difficulty, weakness and light-headedness.  Psychiatric/Behavioral: Negative for sleep disturbance, dysphoric mood and agitation. The patient is not nervous/anxious.     Objective:  BP 120/70 mmHg  Pulse 78  Temp(Src) 97.9 F (36.6 C) (Oral)  Ht 5\' 11"  (1.803 m)  Wt 214 lb (97.07 kg)  BMI 29.86 kg/m2  SpO2 98%  BP Readings from Last 3 Encounters:  03/02/16 120/70  02/19/16 118/70  02/03/16 110/61    Wt Readings from Last 3 Encounters:  03/02/16 214 lb (97.07 kg)  02/19/16 204 lb (92.534 kg)  02/01/16 191 lb 2 oz (86.694 kg)    Physical Exam  Constitutional: He is oriented to person, place, and time. He appears well-developed. No distress.  NAD  HENT:  Mouth/Throat: Oropharynx is clear and moist.  Eyes: Conjunctivae are normal. Pupils are equal, round, and reactive to light.  Neck: Normal range of motion. No JVD present. No thyromegaly present.  Cardiovascular: Normal rate, regular rhythm, normal heart sounds and intact distal pulses.  Exam reveals no gallop and no friction rub.   No murmur heard. Pulmonary/Chest: Effort normal and breath sounds normal. No respiratory distress. He has no wheezes. He has no rales. He exhibits no tenderness.  Abdominal: Soft. Bowel sounds are normal. He exhibits no distension and no mass. There is no tenderness. There is no rebound and no guarding.  Musculoskeletal: Normal range of motion. He exhibits edema. He exhibits no  tenderness.  Lymphadenopathy:    He has no cervical adenopathy.  Neurological: He is alert and oriented to person, place, and time. He has normal reflexes. No cranial nerve deficit. He exhibits normal muscle tone. He displays a negative Romberg sign. Coordination and gait normal.  Skin: Skin is warm and dry. No rash noted.  Psychiatric: He has a normal mood and affect. His behavior is normal. Judgment and thought content normal.  1+-2+ edema B LEs up to distal thighs Mild ascitis  Lab Results  Component Value Date   WBC 4.9 02/19/2016   HGB 12.0* 02/19/2016   HCT 35.4* 02/19/2016   PLT 105.0* 02/19/2016   GLUCOSE 126* 02/19/2016   CHOL 115 02/19/2016   TRIG 58.0 02/19/2016   HDL 44.60 02/19/2016   LDLDIRECT 51.0 04/07/2015   LDLCALC 59 02/19/2016   ALT 21 02/19/2016   AST 41* 02/19/2016   NA 141 02/19/2016   K 3.7 02/19/2016   CL 110 02/19/2016   CREATININE 0.68 02/19/2016   BUN 8 02/19/2016   CO2 25 02/19/2016   TSH 0.22* 02/19/2016   PSA 0.26 06/14/2011   INR 1.39 12/19/2015   HGBA1C 6.0 02/19/2016    Dg Ribs Unilateral W/chest Right  02/01/2016  CLINICAL DATA:  RIGHT-sided rib pain after fall today. History of hypertension, hyperlipidemia and stroke. EXAM: RIGHT RIBS AND CHEST - 3+ VIEW COMPARISON:  Chest radiograph October 08, 2011 FINDINGS: No fracture or other bone lesions are seen involving the ribs. There is no evidence of pneumothorax or pleural effusion. Both lungs are clear. Heart size and mediastinal contours are within normal limits. Please note, BB marker projects over RIGHT abdomen. IMPRESSION: Negative. Electronically Signed   By: Elon Alas M.D.   On: 02/01/2016 22:03   Ct Head Wo Contrast  02/01/2016  CLINICAL DATA:  Confusion EXAM: CT HEAD WITHOUT CONTRAST TECHNIQUE: Contiguous axial images were obtained from the base of the skull through the vertex without intravenous contrast. COMPARISON:  None. FINDINGS: Bony calvarium is intact. No gross soft tissue  abnormality is noted. No findings to suggest acute hemorrhage, acute infarction or space-occupying mass lesion are noted. IMPRESSION: No acute abnormality noted. Electronically Signed   By: Inez Catalina M.D.   On: 02/01/2016 21:28    Assessment & Plan:   There are no diagnoses linked to this encounter. I have discontinued Mr. Cordone lactulose and FERRALET 90. I am also having him maintain his aspirin, sildenafil, metoprolol succinate, atorvastatin, losartan, zolpidem, risperiDONE, acamprosate, thiamine, Cholecalciferol, and levothyroxine.  No orders of the defined types were placed in this encounter.     Follow-up: No Follow-up on file.  Walker Kehr, MD

## 2016-03-02 NOTE — Assessment & Plan Note (Addendum)
BP Readings from Last 3 Encounters:  03/02/16 120/70  02/19/16 118/70  02/03/16 110/61  Stop Losartan. Start Spironolactone

## 2016-03-08 ENCOUNTER — Telehealth: Payer: Self-pay | Admitting: Internal Medicine

## 2016-03-08 ENCOUNTER — Encounter: Payer: Self-pay | Admitting: Family

## 2016-03-08 ENCOUNTER — Ambulatory Visit (INDEPENDENT_AMBULATORY_CARE_PROVIDER_SITE_OTHER)
Admission: RE | Admit: 2016-03-08 | Discharge: 2016-03-08 | Disposition: A | Discharge status: Home / Self Care | Payer: Commercial Managed Care - PPO | Source: Ambulatory Visit | Attending: Family | Admitting: Family

## 2016-03-08 ENCOUNTER — Other Ambulatory Visit (INDEPENDENT_AMBULATORY_CARE_PROVIDER_SITE_OTHER): Payer: Commercial Managed Care - PPO

## 2016-03-08 ENCOUNTER — Ambulatory Visit (INDEPENDENT_AMBULATORY_CARE_PROVIDER_SITE_OTHER): Payer: Commercial Managed Care - PPO | Admitting: Family

## 2016-03-08 VITALS — BP 104/58 | HR 72 | Temp 98.1°F | Ht 71.0 in | Wt 217.0 lb

## 2016-03-08 DIAGNOSIS — R600 Localized edema: Secondary | ICD-10-CM | POA: Diagnosis not present

## 2016-03-08 DIAGNOSIS — J984 Other disorders of lung: Secondary | ICD-10-CM

## 2016-03-08 DIAGNOSIS — R011 Cardiac murmur, unspecified: Secondary | ICD-10-CM

## 2016-03-08 DIAGNOSIS — R188 Other ascites: Secondary | ICD-10-CM

## 2016-03-08 DIAGNOSIS — R918 Other nonspecific abnormal finding of lung field: Secondary | ICD-10-CM

## 2016-03-08 LAB — COMPREHENSIVE METABOLIC PANEL
ALBUMIN: 3.1 g/dL — AB (ref 3.5–5.2)
ALK PHOS: 151 U/L — AB (ref 39–117)
ALT: 15 U/L (ref 0–53)
AST: 31 U/L (ref 0–37)
BILIRUBIN TOTAL: 1.7 mg/dL — AB (ref 0.2–1.2)
BUN: 14 mg/dL (ref 6–23)
CALCIUM: 9.6 mg/dL (ref 8.4–10.5)
CO2: 26 mEq/L (ref 19–32)
Chloride: 108 mEq/L (ref 96–112)
Creatinine, Ser: 0.87 mg/dL (ref 0.40–1.50)
GFR: 96.82 mL/min (ref >60.00)
GLUCOSE: 113 mg/dL — AB (ref 70–99)
POTASSIUM: 4.9 meq/L (ref 3.5–5.1)
Sodium: 139 mEq/L (ref 135–145)
TOTAL PROTEIN: 6.3 g/dL (ref 6.0–8.3)

## 2016-03-08 LAB — AMMONIA: AMMONIA: 37 umol/L — AB (ref 11–35)

## 2016-03-08 MED ORDER — SPIRONOLACTONE 100 MG PO TABS: 100.0000 mg | ORAL_TABLET | Freq: Every day | ORAL | Status: DC

## 2016-03-08 MED ORDER — INDAPAMIDE 1.25 MG PO TABS: 1.2500 mg | ORAL_TABLET | Freq: Every day | ORAL | Status: DC

## 2016-03-08 NOTE — Telephone Encounter (Signed)
Forwarding to [facility]:  Orders for same labs as what was drawn today?

## 2016-03-08 NOTE — Progress Notes (Signed)
Subjective:    Patient ID: Jon Boyd, male    DOB: [birth year], 55 y.o.   MRN: [id number]   Jon Boyd is a 55 y.o. male who presents today for an acute visit.    HPI Comments: Here for reevaluation of LE edema, worsening on spirolactone. Legs mildly better when elevation. Left leg more swollen than right. No calf pain.  Endorses SOB with activity.  No cough, fever, chills, orthopnea. Skin intact.   Per chart review, patient was seen 5/2 in our clinic for edema. He was started on spirolactone 50 mg QD.   New murmur; Denies exertional chest pain or pressure, numbness or tingling radiating to left arm or jaw, palpitations, dizziness, frequent headaches, changes in vision.   Past Medical History  Diagnosis Date  . Hypertension   . Chronic headaches   . Hyperlipidemia   . Stroke ([place])   . Postablative hypothyroidism   . Edema 06/03/2014   Allergies: Review of patient's allergies indicates no known allergies. Current Outpatient Prescriptions on File Prior to Visit  Medication Sig Dispense Refill  . acamprosate (CAMPRAL) 333 MG tablet Take 666 mg by mouth 3 (three) times daily with meals.    Marland Kitchen aspirin 81 MG chewable tablet Chew 81 mg by mouth daily.      Marland Kitchen atorvastatin (LIPITOR) 20 MG tablet Take 1 tablet (20 mg total) by mouth daily at 6 PM. 90 tablet 3  . Cholecalciferol 50000 units TABS Take 1 tablet by mouth once a week. 12 tablet 3  . levothyroxine (SYNTHROID) 175 MCG tablet Take 1 tablet (175 mcg total) by mouth daily before breakfast. 90 tablet 1  . metoprolol succinate (TOPROL-XL) 25 MG 24 hr tablet Take 1 tablet (25 mg total) by mouth daily. 90 tablet 3  . risperiDONE (RISPERDAL) 1 MG tablet Take 1 mg by mouth at bedtime.    . sildenafil (VIAGRA) 100 MG tablet Take 0.5-1 tablets (50-100 mg total) by mouth daily as needed for erectile dysfunction. 10 tablet 3  . spironolactone (ALDACTONE) 50 MG tablet Take 1 tablet (50 mg total) by mouth daily. 30 tablet 11  . thiamine  (VITAMIN B-1) 100 MG tablet Take 100 mg by mouth daily.    Marland Kitchen zolpidem (AMBIEN) 5 MG tablet Take 5 mg by mouth at bedtime as needed for sleep.     No current facility-administered medications on file prior to visit.    Social History  Substance Use Topics  . Smoking status: Current Every Day Smoker -- 0.50 packs/day for 35 years    Types: Cigarettes  . Smokeless tobacco: Never Used  . Alcohol Use: 8.4 oz/week    14 Cans of beer per week     Comment: dr has made him quit since 04-08-15,  pt admitted to drinking on 10/29/15    Review of Systems  Constitutional: Negative for fever and chills.  Respiratory: Positive for shortness of breath. Negative for cough and wheezing.   Cardiovascular: Positive for leg swelling. Negative for chest pain and palpitations.  Gastrointestinal: Negative for nausea and vomiting.  Neurological: Negative for dizziness.      Objective:    BP 104/58 mmHg  Pulse 72  Temp(Src) 98.1 F (36.7 C) (Oral)  Ht 5\' 11"  (1.803 m)  Wt 217 lb (98.431 kg)  BMI 30.28 kg/m2  SpO2 98%   Physical Exam  Constitutional: He appears well-developed and well-nourished.  Cardiovascular: Regular rhythm.   Murmur heard.  Systolic murmur is present with a grade of  2/6  SEM II/VI, Loudest LSB, non radiating, no thrill BLE edema +3 Skin taught, no hair.   Pulmonary/Chest: Effort normal and breath sounds normal. No respiratory distress. He has no wheezes. He has no rhonchi. He has no rales.  Abdominal: Soft. Normal appearance and bowel sounds are normal. He exhibits fluid wave and ascites. He exhibits no distension and no mass. There is no tenderness. There is no rigidity, no rebound and no guarding.  Lymphadenopathy:       Head (left side): No submandibular and no preauricular adenopathy present.  Neurological: He is alert.  Skin: Skin is warm and dry.  Psychiatric: He has a normal mood and affect. His speech is normal and behavior is normal.  Vitals reviewed.       Assessment & Plan:   1. Bilateral edema of lower extremity Edema related to cirrhosis.Concern for weight gain. Has gained 3 lbs since last OV.  -Increase spirolactone to 100mg  QD -Start indapamide 1.25mg  QD -Monitor weight and blood pressure on new regimen particularly for hypotension -encouraged healthy diet with protein, vegetables with chronic low albumin -Recheck CMP 2 weeks -Bilirubin, alkaline phosphatase and albumin are stable at baseline for patient - Ammonia is 37, decreased from prior  2. Newly recognized murmur Concern in context of fluid overload. Pro BNP elevated but CXR shows no CHF. No orthopnea.  - referral to cardiology for further eval  3. Ascites Chronic Hepatic cirrhosis, stable.  -Ammonia level stable, decreased  4. Right lung density Suspected PNA v malignancy. Treating with antibiotics and have pended repeat CXR in 6 weeks to evaluate.  - repeat CXR in 6 weeks   I am having Mr. Brauer maintain his aspirin, sildenafil, metoprolol succinate, atorvastatin, zolpidem, risperiDONE, acamprosate, thiamine, Cholecalciferol, levothyroxine, spironolactone, and losartan.   Meds ordered this encounter  Medications  . losartan (COZAAR) 50 MG tablet    Sig: TAKE 1 TABLET (50 MG TOTAL) BY MOUTH DAILY.    Refill:  3     Start medications as prescribed and explained to patient on After Visit Summary ( AVS). Risks, benefits, and alternatives of the medications and treatment plan prescribed today were discussed, and patient expressed understanding.   Education regarding symptom management and diagnosis given to patient.   Follow-up:Plan follow-up as discussed or as needed if any worsening symptoms or change in condition. F/u with PCP in June.   Continue to follow with Hoyt Koch, MD for routine health maintenance.   Alfonso Ramus and I agreed with plan.   Mable Paris, FNP

## 2016-03-08 NOTE — Telephone Encounter (Signed)
Pt asking to see if we can make sure that the lab work that [facility] wants him to do in 1.5 or 2 weeks is in the system ( do not see future lab order from our office). Pt aware to come back and do the lab next 1.5 or 2 weeks.

## 2016-03-08 NOTE — Progress Notes (Signed)
Pre visit review using our clinic review tool, if applicable. No additional management support is needed unless otherwise documented below in the visit note. 

## 2016-03-08 NOTE — Patient Instructions (Addendum)
Labs downstairs. Chest x-ray downstairs. We will call you with the results.  Please come back in 1.5 to 2 weeks to have lab work rechecked - we want to look at your electrolytes.   If there is no improvement in your symptoms, or if there is any worsening of symptoms, or if you have any additional concerns, please return for re-evaluation; or, if we are closed, consider going to the Emergency Room for evaluation if symptoms urgent.   Peripheral Edema You have swelling in your legs (peripheral edema). This swelling is due to excess accumulation of salt and water in your body. Edema may be a sign of heart, kidney or liver disease, or a side effect of a medication. It may also be due to problems in the leg veins. Elevating your legs and using special support stockings may be very helpful, if the cause of the swelling is due to poor venous circulation. Avoid long periods of standing, whatever the cause. Treatment of edema depends on identifying the cause. Chips, pretzels, pickles and other salty foods should be avoided. Restricting salt in your diet is almost always needed. Water pills (diuretics) are often used to remove the excess salt and water from your body via urine. These medicines prevent the kidney from reabsorbing sodium. This increases urine flow. Diuretic treatment may also result in lowering of potassium levels in your body. Potassium supplements may be needed if you have to use diuretics daily. Daily weights can help you keep track of your progress in clearing your edema. You should call your caregiver for follow up care as recommended. SEEK IMMEDIATE MEDICAL CARE IF:   You have increased swelling, pain, redness, or heat in your legs.  You develop shortness of breath, especially when lying down.  You develop chest or abdominal pain, weakness, or fainting.  You have a fever.   This information is not intended to replace advice given to you by your health care provider. Make sure you  discuss any questions you have with your health care provider.   Document Released: 11/25/2004 Document Revised: 01/10/2012 Document Reviewed: 04/30/2015 Elsevier Interactive Patient Education [employer].

## 2016-03-09 ENCOUNTER — Other Ambulatory Visit: Payer: Self-pay

## 2016-03-09 LAB — PRO B NATRIURETIC PEPTIDE: Pro B Natriuretic peptide (BNP): 167.5 pg/mL — ABNORMAL HIGH (ref <126)

## 2016-03-09 MED ORDER — AZITHROMYCIN 250 MG PO TABS: ORAL_TABLET | ORAL | Status: DC

## 2016-03-09 NOTE — Telephone Encounter (Signed)
CMP is pended in system now.

## 2016-03-09 NOTE — Telephone Encounter (Signed)
Pt is requesting rx to be filled. Pended for your review.

## 2016-03-09 NOTE — Telephone Encounter (Signed)
What Rx? Thx 

## 2016-03-10 ENCOUNTER — Telehealth: Payer: Self-pay | Admitting: Internal Medicine

## 2016-03-10 NOTE — Telephone Encounter (Signed)
Metoprolol, zolpidem and resperidone

## 2016-03-10 NOTE — Telephone Encounter (Signed)
Pt want to speak to the assistant concern about his leg still swollen. Please help, he suppose to go back to work tomrw but not sure if he can due to his leg.

## 2016-03-11 ENCOUNTER — Other Ambulatory Visit: Payer: Self-pay | Admitting: Family

## 2016-03-11 ENCOUNTER — Telehealth: Payer: Self-pay

## 2016-03-11 DIAGNOSIS — R600 Localized edema: Secondary | ICD-10-CM

## 2016-03-11 MED ORDER — RISPERIDONE 1 MG PO TABS: 1.0000 mg | ORAL_TABLET | Freq: Every day | ORAL | Status: DC

## 2016-03-11 MED ORDER — ZOLPIDEM TARTRATE 5 MG PO TABS: 5.0000 mg | ORAL_TABLET | Freq: Every evening | ORAL | Status: DC | PRN

## 2016-03-11 MED ORDER — TORSEMIDE 20 MG PO TABS: 20.0000 mg | ORAL_TABLET | Freq: Every day | ORAL | Status: DC

## 2016-03-11 MED ORDER — METOPROLOL SUCCINATE ER 25 MG PO TB24: 25.0000 mg | ORAL_TABLET | Freq: Every day | ORAL | Status: DC

## 2016-03-11 NOTE — Telephone Encounter (Signed)
Per our conversation this am---i am calling patient to advise to stay out of work---we can provide excuse note from [facility] is checking with dr plotnikov about plan of care, we will call patient back---routing to margaret, NP----i have tried to reach patient at both tele numbers on snapshot and have left message---if patient calls back, he needs to talk with tamara

## 2016-03-11 NOTE — Telephone Encounter (Signed)
Error

## 2016-03-11 NOTE — Telephone Encounter (Signed)
Patient called back---i have advised patient that margaret, NP has sent Demedex to [facility] on [address]---one time dose, should work within 3-5 hours---if not seeing increased fluid output--patient advised he should go to ED for iv therapy to rid fluid and monitor I&O's closely due to kidney function and existing liver dx---patient will be coming to our office to pick up note for work--patient stated he would try demedex and then go to ED this afternoon if no better---if patient calls back with further questions,  He can talk with Necia Kamm

## 2016-03-12 ENCOUNTER — Emergency Department (HOSPITAL_COMMUNITY)
Admission: EM | Admit: 2016-03-12 | Discharge: 2016-03-12 | Disposition: A | Discharge status: Home / Self Care | Payer: Commercial Managed Care - PPO | Attending: Emergency Medicine | Admitting: Emergency Medicine

## 2016-03-12 ENCOUNTER — Encounter (INDEPENDENT_AMBULATORY_CARE_PROVIDER_SITE_OTHER): Payer: Commercial Managed Care - PPO

## 2016-03-12 ENCOUNTER — Emergency Department (EMERGENCY_DEPARTMENT_HOSPITAL)
Admit: 2016-03-12 | Discharge: 2016-03-12 | Disposition: A | Discharge status: Home / Self Care | Payer: Commercial Managed Care - PPO | Attending: Emergency Medicine | Admitting: Emergency Medicine

## 2016-03-12 ENCOUNTER — Encounter (HOSPITAL_COMMUNITY): Payer: Self-pay | Admitting: Emergency Medicine

## 2016-03-12 DIAGNOSIS — F17210 Nicotine dependence, cigarettes, uncomplicated: Secondary | ICD-10-CM | POA: Diagnosis not present

## 2016-03-12 DIAGNOSIS — R0602 Shortness of breath: Secondary | ICD-10-CM | POA: Diagnosis present

## 2016-03-12 DIAGNOSIS — M79606 Pain in leg, unspecified: Secondary | ICD-10-CM | POA: Diagnosis not present

## 2016-03-12 DIAGNOSIS — E785 Hyperlipidemia, unspecified: Secondary | ICD-10-CM | POA: Insufficient documentation

## 2016-03-12 DIAGNOSIS — M79662 Pain in left lower leg: Secondary | ICD-10-CM | POA: Diagnosis not present

## 2016-03-12 DIAGNOSIS — M7989 Other specified soft tissue disorders: Secondary | ICD-10-CM

## 2016-03-12 DIAGNOSIS — G8929 Other chronic pain: Secondary | ICD-10-CM | POA: Diagnosis not present

## 2016-03-12 DIAGNOSIS — I10 Essential (primary) hypertension: Secondary | ICD-10-CM | POA: Insufficient documentation

## 2016-03-12 DIAGNOSIS — R600 Localized edema: Secondary | ICD-10-CM | POA: Insufficient documentation

## 2016-03-12 DIAGNOSIS — Z79899 Other long term (current) drug therapy: Secondary | ICD-10-CM | POA: Insufficient documentation

## 2016-03-12 DIAGNOSIS — E890 Postprocedural hypothyroidism: Secondary | ICD-10-CM | POA: Diagnosis not present

## 2016-03-12 DIAGNOSIS — Z7982 Long term (current) use of aspirin: Secondary | ICD-10-CM | POA: Insufficient documentation

## 2016-03-12 DIAGNOSIS — F1020 Alcohol dependence, uncomplicated: Secondary | ICD-10-CM | POA: Insufficient documentation

## 2016-03-12 DIAGNOSIS — K7031 Alcoholic cirrhosis of liver with ascites: Secondary | ICD-10-CM | POA: Insufficient documentation

## 2016-03-12 DIAGNOSIS — Z8673 Personal history of transient ischemic attack (TIA), and cerebral infarction without residual deficits: Secondary | ICD-10-CM | POA: Insufficient documentation

## 2016-03-12 DIAGNOSIS — M79661 Pain in right lower leg: Secondary | ICD-10-CM | POA: Insufficient documentation

## 2016-03-12 LAB — BASIC METABOLIC PANEL
Anion gap: 9 (ref 5–15)
BUN: 21 mg/dL — ABNORMAL HIGH (ref 6–20)
CO2: 28 mmol/L (ref 22–32)
Calcium: 9.9 mg/dL (ref 8.9–10.3)
Chloride: 106 mmol/L (ref 101–111)
Creatinine, Ser: 1.01 mg/dL (ref 0.61–1.24)
GFR calc Af Amer: >60 mL/min (ref >60)
GFR calc non Af Amer: >60 mL/min (ref >60)
Glucose, Bld: 119 mg/dL — ABNORMAL HIGH (ref 65–99)
Potassium: 4.5 mmol/L (ref 3.5–5.1)
Sodium: 143 mmol/L (ref 135–145)

## 2016-03-12 LAB — CBC WITH DIFFERENTIAL/PLATELET
Basophils Absolute: 0 10*3/uL (ref 0.0–0.1)
Basophils Relative: 1 %
Eosinophils Absolute: 0.2 10*3/uL (ref 0.0–0.7)
Eosinophils Relative: 3 %
HCT: 32.9 % — ABNORMAL LOW (ref 39.0–52.0)
Hemoglobin: 11.7 g/dL — ABNORMAL LOW (ref 13.0–17.0)
Lymphocytes Relative: 37 %
Lymphs Abs: 1.9 10*3/uL (ref 0.7–4.0)
MCH: 31.4 pg (ref 26.0–34.0)
MCHC: 35.6 g/dL (ref 30.0–36.0)
MCV: 88.2 fL (ref 78.0–100.0)
Monocytes Absolute: 0.8 10*3/uL (ref 0.1–1.0)
Monocytes Relative: 15 %
Neutro Abs: 2.3 10*3/uL (ref 1.7–7.7)
Neutrophils Relative %: 45 %
Platelets: 103 10*3/uL — ABNORMAL LOW (ref 150–400)
RBC: 3.73 MIL/uL — ABNORMAL LOW (ref 4.22–5.81)
RDW: 18.1 % — ABNORMAL HIGH (ref 11.5–15.5)
WBC: 5.1 10*3/uL (ref 4.0–10.5)

## 2016-03-12 LAB — COMPREHENSIVE METABOLIC PANEL
ALK PHOS: 139 U/L — AB (ref 39–117)
ALT: 14 U/L (ref 0–53)
AST: 30 U/L (ref 0–37)
Albumin: 3.3 g/dL — ABNORMAL LOW (ref 3.5–5.2)
BILIRUBIN TOTAL: 1.8 mg/dL — AB (ref 0.2–1.2)
BUN: 20 mg/dL (ref 6–23)
CALCIUM: 9.7 mg/dL (ref 8.4–10.5)
CHLORIDE: 103 meq/L (ref 96–112)
CO2: 28 meq/L (ref 19–32)
Creatinine, Ser: 1 mg/dL (ref 0.40–1.50)
GFR: 82.44 mL/min (ref >60.00)
Glucose, Bld: 123 mg/dL — ABNORMAL HIGH (ref 70–99)
Potassium: 3.7 mEq/L (ref 3.5–5.1)
Sodium: 140 mEq/L (ref 135–145)
Total Protein: 6.4 g/dL (ref 6.0–8.3)

## 2016-03-12 LAB — BRAIN NATRIURETIC PEPTIDE: B Natriuretic Peptide: 34.7 pg/mL (ref 0.0–100.0)

## 2016-03-12 MED ORDER — TORSEMIDE 20 MG PO TABS: 20.0000 mg | ORAL_TABLET | Freq: Every day | ORAL | Status: DC

## 2016-03-12 MED ORDER — FUROSEMIDE 10 MG/ML IJ SOLN
80.0000 mg | Freq: Once | INTRAMUSCULAR | Status: AC
  Administered 2016-03-12: 80 mg via INTRAVENOUS
  Filled 2016-03-12: qty 8

## 2016-03-12 NOTE — ED Notes (Signed)
Pt reports worsening bilateral lower leg swelling with associated SOB onset two weeks ago; seen by PCP yesterday whom adjusted fluid medication.

## 2016-03-12 NOTE — Progress Notes (Signed)
*  PRELIMINARY RESULTS* Vascular Ultrasound Lower extremity venous duplex has been completed.  Preliminary findings: No evidence of DVT or baker's cyst.   Landry Mellow, RDMS, RVT  03/12/2016, 3:27 PM

## 2016-03-12 NOTE — Discharge Instructions (Signed)
Follow up with your PCP on Monday or Tuesday next week for recheck of edema and shortness of breath.   Edema Edema is an abnormal buildup of fluids in your bodytissues. Edema is somewhatdependent on gravity to pull the fluid to the lowest place in your body. That makes the condition more common in the legs and thighs (lower extremities). Painless swelling of the feet and ankles is common and becomes more likely as you get older. It is also common in looser tissues, like around your eyes.  When the affected area is squeezed, the fluid may move out of that spot and leave a dent for a few moments. This dent is called pitting.  CAUSES  There are many possible causes of edema. Eating too much salt and being on your feet or sitting for a long time can cause edema in your legs and ankles. Hot weather may make edema worse. Common medical causes of edema include:  Heart failure.  Liver disease.  Kidney disease.  Weak blood vessels in your legs.  Cancer.  An injury.  Pregnancy.  Some medications.  Obesity. SYMPTOMS  Edema is usually painless.Your skin may look swollen or shiny.  DIAGNOSIS  Your health care provider may be able to diagnose edema by asking about your medical history and doing a physical exam. You may need to have tests such as X-rays, an electrocardiogram, or blood tests to check for medical conditions that may cause edema.  TREATMENT  Edema treatment depends on the cause. If you have heart, liver, or kidney disease, you need the treatment appropriate for these conditions. General treatment may include:  Elevation of the affected body part above the level of your heart.  Compression of the affected body part. Pressure from elastic bandages or support stockings squeezes the tissues and forces fluid back into the blood vessels. This keeps fluid from entering the tissues.  Restriction of fluid and salt intake.  Use of a water pill (diuretic). These medications are  appropriate only for some types of edema. They pull fluid out of your body and make you urinate more often. This gets rid of fluid and reduces swelling, but diuretics can have side effects. Only use diuretics as directed by your health care provider. HOME CARE INSTRUCTIONS   Keep the affected body part above the level of your heart when you are lying down.   Do not sit still or stand for prolonged periods.   Do not put anything directly under your knees when lying down.  Do not wear constricting clothing or garters on your upper legs.   Exercise your legs to work the fluid back into your blood vessels. This may help the swelling go down.   Wear elastic bandages or support stockings to reduce ankle swelling as directed by your health care provider.   Eat a low-salt diet to reduce fluid if your health care provider recommends it.   Only take medicines as directed by your health care provider. SEEK MEDICAL CARE IF:   Your edema is not responding to treatment.  You have heart, liver, or kidney disease and notice symptoms of edema.  You have edema in your legs that does not improve after elevating them.   You have sudden and unexplained weight gain. SEEK IMMEDIATE MEDICAL CARE IF:   You develop shortness of breath or chest pain.   You cannot breathe when you lie down.  You develop pain, redness, or warmth in the swollen areas.   You have heart, liver,  or kidney disease and suddenly get edema.  You have a fever and your symptoms suddenly get worse. MAKE SURE YOU:   Understand these instructions.  Will watch your condition.  Will get help right away if you are not doing well or get worse.   This information is not intended to replace advice given to you by your health care provider. Make sure you discuss any questions you have with your health care provider.   Document Released: 10/18/2005 Document Revised: 11/08/2014 Document Reviewed: 08/10/2013 Elsevier  Interactive Patient Education 2016 [employer].  Cirrhosis Cirrhosis is long-term (chronic) liver injury. The liver is your largest internal organ, and it performs many functions. The liver converts food into energy, removes toxic material from your blood, makes important proteins, and absorbs necessary vitamins from your diet. If you have cirrhosis, it means many of your healthy liver cells have been replaced by scar tissue. This prevents blood from flowing through your liver, which makes it difficult for your liver to function. This scarring is not reversible, but treatment can prevent it from getting worse.  CAUSES  Hepatitis C and long-term alcohol abuse are the most common causes of cirrhosis. Other causes include:  Nonalcoholic fatty liver disease.  Hepatitis B infection.  Autoimmune hepatitis.  Diseases that cause blockage of ducts inside the liver.  Inherited liver diseases.  Reactions to certain long-term medicines.  Parasitic infections.  Long-term exposure to certain toxins. RISK FACTORS You may have a higher risk of cirrhosis if you:  Have certain hepatitis viruses.  Abuse alcohol, especially if you are male.  Are overweight.  Share needles.  Have unprotected sex with someone who has hepatitis. SYMPTOMS  You may not have any signs and symptoms at first. Symptoms may not develop until the damage to your liver starts to get worse. Signs and symptoms of cirrhosis may include:   Tenderness in the right-upper part of your abdomen.  Weakness and tiredness (fatigue).  Loss of appetite.  Nausea.  Weight loss and muscle loss.  Itchiness.  Yellow skin and eyes (jaundice).  Buildup of fluid in the abdomen (ascites).  Swelling of the feet and ankles (edema).  Appearance of tiny blood vessels under the skin.  Mental confusion.  Easy bruising and bleeding. DIAGNOSIS  Your health care provider may suspect cirrhosis based on your symptoms and medical  history, especially if you have other medical conditions or a history of alcohol abuse. Your health care provider will do a physical exam to feel your liver and check for signs of cirrhosis. Your health care provider may perform other tests, including:   Blood tests to check:   Whether you have hepatitis B or C.   Kidney function.  Liver function.  Imaging tests such as:  MRI or CT scan to look for changes seen in advanced cirrhosis.  Ultrasound to see if normal liver tissue is being replaced by scar tissue.  A procedure using a long needle to take a sample of liver tissue (biopsy) for examination under a microscope. Liver biopsy can confirm the diagnosis of cirrhosis.  TREATMENT  Treatment depends on how damaged your liver is and what caused the damage. Treatment may include treating cirrhosis symptoms or treating the underlying causes of the condition to try to slow the progression of the damage. Treatment may include:  Making lifestyle changes, such as:   Eating a healthy diet.  Restricting salt intake.  Maintaining a healthy weight.   Not abusing drugs or alcohol.  Taking medicines  to:  Treat liver infections or other infections.  Control itching.  Reduce fluid buildup.  Reduce certain blood toxins.  Reduce risk of bleeding from enlarged blood vessels in the stomach or esophagus (varices).  If varices are causing bleeding problems, you may need treatment with a procedure that ties up the vessels causing them to fall off (band ligation).  If cirrhosis is causing your liver to fail, your health care provider may recommend a liver transplant.  Other treatments may be recommended depending on any complications of cirrhosis, such as liver-related kidney failure (hepatorenal syndrome). HOME CARE INSTRUCTIONS   Take medicines only as directed by your health care provider. Do not use drugs that are toxic to your liver. Ask your health care provider before taking any  new medicines, including over-the-counter medicines.   Rest as needed.  Eat a well-balanced diet. Ask your health care provider or dietitian for more information.   You may have to follow a low-salt diet or restrict your water intake as directed.  Do not drink alcohol. This is especially important if you are taking acetaminophen.  Keep all follow-up visits as directed by your health care provider. This is important. SEEK MEDICAL CARE IF:  You have fatigue or weakness that is getting worse.  You develop swelling of the hands, feet, legs, or face.  You have a fever.  You develop loss of appetite.  You have nausea or vomiting.  You develop jaundice.  You develop easy bruising or bleeding. SEEK IMMEDIATE MEDICAL CARE IF:  You vomit bright red blood or a material that looks like coffee grounds.  You have blood in your stools.  Your stools appear black and tarry.  You become confused.  You have chest pain or trouble breathing.   This information is not intended to replace advice given to you by your health care provider. Make sure you discuss any questions you have with your health care provider.   Document Released: 10/18/2005 Document Revised: 11/08/2014 Document Reviewed: 06/26/2014 Elsevier Interactive Patient Education 2016 [employer] of Breath Shortness of breath means you have trouble breathing. It could also mean that you have a medical problem. You should get immediate medical care for shortness of breath. CAUSES   Not enough oxygen in the air such as with high altitudes or a smoke-filled room.  Certain lung diseases, infections, or problems.  Heart disease or conditions, such as angina or heart failure.  Low red blood cells (anemia).  Poor physical fitness, which can cause shortness of breath when you exercise.  Chest or back injuries or stiffness.  Being overweight.  Smoking.  Anxiety, which can make you feel like you are not getting  enough air. DIAGNOSIS  Serious medical problems can often be found during your physical exam. Tests may also be done to determine why you are having shortness of breath. Tests may include:  Chest X-rays.  Lung function tests.  Blood tests.  An electrocardiogram (ECG).  An ambulatory electrocardiogram. An ambulatory ECG records your heartbeat patterns over a 24-hour period.  Exercise testing.  A transthoracic echocardiogram (TTE). During echocardiography, sound waves are used to evaluate how blood flows through your heart.  A transesophageal echocardiogram (TEE).  Imaging scans. Your health care provider may not be able to find a cause for your shortness of breath after your exam. In this case, it is important to have a follow-up exam with your health care provider as directed.  TREATMENT  Treatment for shortness of breath depends on  the cause of your symptoms and can vary greatly. HOME CARE INSTRUCTIONS   Do not smoke. Smoking is a common cause of shortness of breath. If you smoke, ask for help to quit.  Avoid being around chemicals or things that may bother your breathing, such as paint fumes and dust.  Rest as needed. Slowly resume your usual activities.  If medicines were prescribed, take them as directed for the full length of time directed. This includes oxygen and any inhaled medicines.  Keep all follow-up appointments as directed by your health care provider. SEEK MEDICAL CARE IF:   Your condition does not improve in the time expected.  You have a hard time doing your normal activities even with rest.  You have any new symptoms. SEEK IMMEDIATE MEDICAL CARE IF:   Your shortness of breath gets worse.  You feel light-headed, faint, or develop a cough not controlled with medicines.  You start coughing up blood.  You have pain with breathing.  You have chest pain or pain in your arms, shoulders, or abdomen.  You have a fever.  You are unable to walk up stairs  or exercise the way you normally do. MAKE SURE YOU:  Understand these instructions.  Will watch your condition.  Will get help right away if you are not doing well or get worse.   This information is not intended to replace advice given to you by your health care provider. Make sure you discuss any questions you have with your health care provider.   Document Released: 07/13/2001 Document Revised: 10/23/2013 Document Reviewed: 01/03/2012 Elsevier Interactive Patient Education [employer].

## 2016-03-12 NOTE — ED Provider Notes (Signed)
Jon Boyd  12/31/1960  Pt was sent to the ER via PCP for gradually progressing exertional dyspnea and LE swelling, not improved with spironolactone and one day of torsemide.  I assumed care of pt at shift change with DVT study and labs pending.  Results for orders placed or performed during the hospital encounter of [id number]  Basic metabolic panel  Result Value Ref Range   Sodium 143 135 - 145 mmol/L   Potassium 4.5 3.5 - 5.1 mmol/L   Chloride 106 101 - 111 mmol/L   CO2 28 22 - 32 mmol/L   Glucose, Bld 119 (H) 65 - 99 mg/dL   BUN 21 (H) 6 - 20 mg/dL   Creatinine, Ser 1.01 0.61 - 1.24 mg/dL   Calcium 9.9 8.9 - 10.3 mg/dL   GFR calc non Af Amer >60 >60 mL/min   GFR calc Af Amer >60 >60 mL/min   Anion gap 9 5 - 15  CBC with Differential  Result Value Ref Range   WBC 5.1 4.0 - 10.5 K/uL   RBC 3.73 (L) 4.22 - 5.81 MIL/uL   Hemoglobin 11.7 (L) 13.0 - 17.0 g/dL   HCT 32.9 (L) 39.0 - 52.0 %   MCV 88.2 78.0 - 100.0 fL   MCH 31.4 26.0 - 34.0 pg   MCHC 35.6 30.0 - 36.0 g/dL   RDW 18.1 (H) 11.5 - 15.5 %   Platelets 103 (L) 150 - 400 K/uL   Neutrophils Relative % 45 %   Neutro Abs 2.3 1.7 - 7.7 K/uL   Lymphocytes Relative 37 %   Lymphs Abs 1.9 0.7 - 4.0 K/uL   Monocytes Relative 15 %   Monocytes Absolute 0.8 0.1 - 1.0 K/uL   Eosinophils Relative 3 %   Eosinophils Absolute 0.2 0.0 - 0.7 K/uL   Basophils Relative 1 %   Basophils Absolute 0.0 0.0 - 0.1 K/uL  Brain natriuretic peptide  Result Value Ref Range   B Natriuretic Peptide 34.7 0.0 - 100.0 pg/mL   Dg Chest 2 View  03/08/2016  CLINICAL DATA:  Three weeks of bilateral lower extremity swelling and shortness of breath, current smoker, history of cirrhosis, previous CVA. EXAM: CHEST  2 VIEW COMPARISON:  Chest x-ray of February 01, 2016 FINDINGS: The lungs are adequately inflated. There is no focal infiltrate. There are is subtle nodular density projecting over the anterior aspect of the right fifth rib which is new since previous  studies. The heart is normal in size. The pulmonary vascularity is not engorged. The mediastinum is normal in width. There is no pleural effusion. The bony thorax exhibits no acute abnormality. IMPRESSION: 1. There is no evidence of CHF. 2. Subtle rounded density projecting over the anterior aspect of the right fifth rib which is more conspicuous than in the past but may reflect a confluence of densities. This may reflect focal pneumonia in the anterior aspect of the middle lobe. Followup PA and lateral chest X-ray is recommended in 3-4 weeks following trial of antibiotic therapy to ensure resolution and exclude underlying malignancy. Electronically Signed   By: David  Martinique M.D.   On: 03/08/2016 15:19   LE duplex study:   Patient Information    Patient Name Sex DOB SSN   Jon Boyd Male January 19, 1961 [id number]    Progress Notes by Chapman Fitch at 03/12/2016 3:27 PM    Author: Chapman Fitch Service: Vascular Lab Author Type: Cardiovascular Sonographer   Filed: 03/12/2016 3:28 PM Note Time: 03/12/2016 3:27  PM Status: Signed   Editor: Chapman Fitch (Cardiovascular Sonographer)     Expand All Collapse All   *PRELIMINARY RESULTS* Vascular Ultrasound Lower extremity venous duplex has been completed. Preliminary findings: No evidence of DVT or baker's cyst.   Landry Mellow, RDMS, RVT 03/12/2016, 3:27 PM          5:44 PM Pt is resting comfortably in bed.  Labs significant for normocytic anemia, thrombocytopenia, consistent with recent labs, electrolytes WNL, BNP improved from prior.  Vitals stable.  Will prescribe torsemide 40 mg per day, pt will need to follow up with PCP on Monday for recheck.  Plan reviewed with Dr. Wilson Singer and pt, in agreement.  Return precautions reviewed.  Pt verbalized understanding.  Filed Vitals:   03/12/16 1425 03/12/16 1728  BP: 143/71 117/74  Pulse: 87 89  Temp: 97.8 F (36.6 C)   TempSrc: Oral   Resp: 18   Height: 5\' 11"  (1.803 m)   Weight: 98.431  kg   SpO2: 100% 96%           Delsa Grana, PA-C 03/12/16 [place], MD 03/14/16 [phone]

## 2016-03-12 NOTE — ED Provider Notes (Signed)
CSN: [id number]     Arrival date & time 03/12/16  1358 History   First MD Initiated Contact with Patient 03/12/16 1420     Chief Complaint  Patient presents with  . Leg Swelling  . Shortness of Breath     (Consider location/radiation/quality/duration/timing/severity/associated sxs/prior Treatment) HPI Comments: Patient is a 55 year old male with recently diagnosed cirrhosis who presents with bilateral lower extremity edema and shortness of breath. Patient reports he has been experiencing these symptoms for the past 2 weeks. He has had increasing shortness of breath with exertion over the past 2 weeks. He denies PND or orthopnea. He has bilateral calf pain, L>R that began with his swelling. He has no history of DVT or cancer. He had a cholecystectomy in March. He has been worked up extensively by his PCP.  He had a chest x-ray 4 days ago which showed a subtle density and his PCP started him on a trial of azithromycin. The patient has one more dose tomorrow. He had a dry cough and hoarseness that began with the symptoms. The chest x-ray did not show CHF. His PCP also found a new murmur, which he is being referred to cardiology outpatient. The patient was started on Demadex yesterday for his leg swelling. He was told that if his swelling did not decrease in 3-5 hours, that he should go to the emergency department to receive IV diuretic medication. Patient denies chest pain, abdominal pain, nausea, vomiting, dysuria. Patient states he has not had increased urination since starting Demadex.   Patient is a 55 y.o. male presenting with shortness of breath. The history is provided by the patient.  Shortness of Breath Associated symptoms: cough   Associated symptoms: no abdominal pain, no chest pain, no fever, no headaches, no rash, no sore throat and no vomiting     Past Medical History  Diagnosis Date  . Hypertension   . Chronic headaches   . Hyperlipidemia   . Stroke ([place])   . Postablative  hypothyroidism   . Edema 06/03/2014   Past Surgical History  Procedure Laterality Date  . Back surgery    . Ankle surgery    . Finger surgery  09/2012    gramig  . Colonoscopy    . Polypectomy    . Cholecystectomy N/A 12/24/2015    Procedure: LAPAROSCOPIC CHOLECYSTECTOMY ;  Surgeon: Mickeal Skinner, MD;  Location: [facility];  Service: General;  Laterality: N/A;  . Lymph node dissection N/A 12/24/2015    Procedure: POSSIBLE LYMPHADENECTOMY;  Surgeon: Arta Bruce Kinsinger, MD;  Location: [facility];  Service: General;  Laterality: N/A;   Family History  Problem Relation Age of Onset  . COPD Mother   . Tremor Mother   . Hyperthyroidism Sister   . Colon cancer Father 13  . Colon polyps Father    Social History  Substance Use Topics  . Smoking status: Current Every Day Smoker -- 0.50 packs/day for 35 years    Types: Cigarettes  . Smokeless tobacco: Never Used  . Alcohol Use: 8.4 oz/week    14 Cans of beer per week     Comment: dr has made him quit since 04-08-15,  pt admitted to drinking on 10/29/15    Review of Systems  Constitutional: Negative for fever and chills.  HENT: Negative for facial swelling and sore throat.   Respiratory: Positive for cough and shortness of breath.   Cardiovascular: Positive for leg swelling. Negative for chest pain.  Gastrointestinal: Negative for nausea, vomiting and  abdominal pain.  Genitourinary: Negative for dysuria.  Musculoskeletal: Negative for back pain.  Skin: Negative for rash and wound.  Neurological: Negative for headaches.  Psychiatric/Behavioral: The patient is not nervous/anxious.       Allergies  Review of patient's allergies indicates no known allergies.  Home Medications   Prior to Admission medications   Medication Sig Start Date End Date Taking? Authorizing Provider  acamprosate (CAMPRAL) 333 MG tablet Take 666 mg by mouth 3 (three) times daily with meals.   Yes Historical Provider, MD  aspirin 81 MG chewable tablet Chew 81  mg by mouth daily.     Yes Historical Provider, MD  atorvastatin (LIPITOR) 20 MG tablet Take 1 tablet (20 mg total) by mouth daily at 6 PM. 08/26/15  Yes Rowe Clack, MD  azithromycin (ZITHROMAX) 250 MG tablet Tale 500 mg PO on day 1, then 250 mg PO q24h x 4 days. 03/09/16  Yes Burnard Hawthorne, FNP  Cholecalciferol 50000 units TABS Take 1 tablet by mouth once a week. 02/19/16  Yes Janith Lima, MD  furosemide (LASIX) 20 MG tablet Take 40 mg by mouth once.   Yes Historical Provider, MD  indapamide (LOZOL) 1.25 MG tablet Take 1 tablet (1.25 mg total) by mouth daily. 03/08/16  Yes Burnard Hawthorne, FNP  levothyroxine (SYNTHROID) 175 MCG tablet Take 1 tablet (175 mcg total) by mouth daily before breakfast. 02/19/16  Yes Janith Lima, MD  losartan (COZAAR) 50 MG tablet TAKE 1 TABLET (50 MG TOTAL) BY MOUTH DAILY. 12/24/15  Yes Historical Provider, MD  metoprolol succinate (TOPROL-XL) 25 MG 24 hr tablet Take 1 tablet (25 mg total) by mouth daily. 03/11/16  Yes Aleksei Plotnikov V, MD  risperiDONE (RISPERDAL) 1 MG tablet Take 1 tablet (1 mg total) by mouth at bedtime. 03/11/16  Yes Aleksei Plotnikov V, MD  sildenafil (VIAGRA) 100 MG tablet Take 0.5-1 tablets (50-100 mg total) by mouth daily as needed for erectile dysfunction. 10/02/14  Yes Rowe Clack, MD  spironolactone (ALDACTONE) 100 MG tablet Take 1 tablet (100 mg total) by mouth daily. 03/08/16  Yes Burnard Hawthorne, FNP  thiamine (VITAMIN B-1) 100 MG tablet Take 100 mg by mouth daily.   Yes Historical Provider, MD  torsemide (DEMADEX) 20 MG tablet Take 1 tablet (20 mg total) by mouth daily. 03/11/16  Yes Burnard Hawthorne, FNP  zolpidem (AMBIEN) 5 MG tablet Take 1 tablet (5 mg total) by mouth at bedtime as needed for sleep. Patient not taking: Reported on 03/12/2016 03/11/16   Aleksei Plotnikov V, MD   BP 143/71 mmHg  Pulse 87  Temp(Src) 97.8 F (36.6 C) (Oral)  Resp 18  Ht 5\' 11"  (1.803 m)  Wt 98.431 kg  BMI 30.28 kg/m2  SpO2  100% Physical Exam  Constitutional: He appears well-developed and well-nourished. No distress.  HENT:  Head: Normocephalic and atraumatic.  Mouth/Throat: Oropharynx is clear and moist. No oropharyngeal exudate.  Eyes: Conjunctivae are normal. Pupils are equal, round, and reactive to light. Right eye exhibits no discharge. Left eye exhibits no discharge. No scleral icterus.  Neck: Normal range of motion. Neck supple. No thyromegaly present.  Cardiovascular: Normal rate, regular rhythm, normal heart sounds and intact distal pulses.  Exam reveals no gallop and no friction rub.   No murmur heard. Pulmonary/Chest: Effort normal and breath sounds normal. No stridor. No respiratory distress. He has no wheezes. He has no rales.  Abdominal: Soft. Bowel sounds are normal. He exhibits fluid wave  and ascites. There is tenderness in the right upper quadrant. There is no rigidity, no rebound and no guarding.    Musculoskeletal: He exhibits edema (Pitting edema bilaterally).  Bilateral calf pain on palpation, L>R  Lymphadenopathy:    He has no cervical adenopathy.  Neurological: He is alert. Coordination normal.  Skin: Skin is warm and dry. No rash noted. He is not diaphoretic. No pallor.  Psychiatric: He has a normal mood and affect.  Nursing note and vitals reviewed.   ED Course  Procedures (including critical care time) Labs Review Labs Reviewed  BASIC METABOLIC PANEL  CBC WITH DIFFERENTIAL/PLATELET  BRAIN NATRIURETIC PEPTIDE    Imaging Review No results found. I have personally reviewed and evaluated these images and lab results as part of my medical decision-making.   EKG Interpretation None      MDM   Chest x-ray 4 days ago showed subtle rounded density projecting over the anterior aspect of the right fifth rib that may reflect focal pneumonia in the anterior aspect of the middle lobe. Followup PA and lateral chest X-ray is recommended in 3-4 weeks following trial of antibiotic  therapy to ensure resolution and exclude underlying malignancy. Patient on trial of azithromycin. BNP 4 days ago 167.5. BNP, BMP, CBC pending. DVT study ordered and results pending. IV Lasix 80 mg given in ED. At shift change, patient care transferred to Delsa Grana, PA-C for continued evaluation, follow up of labs, imaging, and reassessment after Lasix and determination of disposition. Anticipate discharge if edema improved, no electrolyte abnomalities and venous ultrasound negative for DVT. Plan to have patient continue Demadex and follow up with PCP next week. Patient also evaluated by Dr. Wilson Singer who is in agreement with plan.   Final diagnoses:  Leg pain  Leg swelling        Frederica Kuster, PA-C 03/12/16 1609  Virgel Manifold, MD 03/18/16 [phone]

## 2016-03-18 ENCOUNTER — Encounter: Payer: Self-pay | Admitting: Internal Medicine

## 2016-03-18 ENCOUNTER — Ambulatory Visit (INDEPENDENT_AMBULATORY_CARE_PROVIDER_SITE_OTHER): Payer: Commercial Managed Care - PPO | Admitting: Internal Medicine

## 2016-03-18 VITALS — BP 118/62 | HR 74 | Resp 20 | Wt 209.0 lb

## 2016-03-18 DIAGNOSIS — R0600 Dyspnea, unspecified: Secondary | ICD-10-CM | POA: Diagnosis not present

## 2016-03-18 DIAGNOSIS — G4700 Insomnia, unspecified: Secondary | ICD-10-CM | POA: Diagnosis not present

## 2016-03-18 DIAGNOSIS — R601 Generalized edema: Secondary | ICD-10-CM

## 2016-03-18 MED ORDER — ALBUTEROL SULFATE HFA 108 (90 BASE) MCG/ACT IN AERS: 2.0000 | INHALATION_SPRAY | Freq: Four times a day (QID) | RESPIRATORY_TRACT | Status: AC | PRN

## 2016-03-18 MED ORDER — ZOLPIDEM TARTRATE 5 MG PO TABS: 5.0000 mg | ORAL_TABLET | Freq: Every evening | ORAL | Status: DC | PRN

## 2016-03-18 MED ORDER — TIOTROPIUM BROMIDE MONOHYDRATE 18 MCG IN CAPS: 18.0000 ug | ORAL_CAPSULE | Freq: Every day | RESPIRATORY_TRACT | Status: DC

## 2016-03-18 NOTE — Patient Instructions (Signed)
Please cut back on your fluid intake to about half of what you are now doing  Please take all new medication as prescribed - the inhalers to try  Please continue all other medications as before, and refills have been done if requested.  Please have the pharmacy call with any other refills you may need.  Please keep your appointments with your specialists as you may have planned  Please see Dr Ronnald Ramp in 2 weeks

## 2016-03-18 NOTE — Assessment & Plan Note (Signed)
Improved, d/w pt to reduce fluid intake as this can make the swelling worse;  Cont same diuretic for now, no other evaluation needed at this time, f/u with Dr Ronnald Ramp in 2 wks

## 2016-03-18 NOTE — Assessment & Plan Note (Signed)
Suspect underlying copd - for trial albut MDI prn, as spiriva once daily, consider PFT;s

## 2016-03-18 NOTE — Progress Notes (Signed)
Subjective:    Patient ID: Jon Boyd, male    DOB: [birth year], 55 y.o.   MRN: [id number]  HPI    Here with persistent but improved bilat LE swelling x 3 wks, as well as sob/doe; has hx of cirrhosis, ETOH in remission,  Pt denies chest pain,  orthopnea, PND, palpitations, dizziness or syncope, but has had ? mild wheezing not improved with prednisone..  Recent BNP normal, has minor anemia but good renal and hepatic function. Normal WBC  Cont's to smoke about 1/2 ppd, no formal dx of COPD.   Pt denies fever, wt loss, night sweats, loss of appetite, or other constitutional symptoms, except for intentionally losing wt recently after gained.  Wt Readings from Last 3 Encounters:  03/18/16 209 lb (94.802 kg)  03/12/16 217 lb (98.431 kg)  03/08/16 217 lb (98.431 kg)  Has had recent cxr neg, LE venous dopplers neg, Does have hepatic cirrhosis, taking lasix 40 mg one pill only one dosing on may 12.  Also on lozol daily, also on demadex 20 mg daily.  Admits to drinking daily at least 15 x 12 oz fluid drinks per day. Thought this might help.   Denies hyper or hypo thyroid symptoms such as voice, skin or hair change, did have med dose change April 2017 from 225 to 175 due to low TSH.  Pt denies new neurological symptoms such as new headache, or facial or extremity weakness or numbness   Pt denies polydipsia, polyuria Past Medical History  Diagnosis Date  . Hypertension   . Chronic headaches   . Hyperlipidemia   . Stroke ([place])   . Postablative hypothyroidism   . Edema 06/03/2014   Past Surgical History  Procedure Laterality Date  . Back surgery    . Ankle surgery    . Finger surgery  09/2012    gramig  . Colonoscopy    . Polypectomy    . Cholecystectomy N/A 12/24/2015    Procedure: LAPAROSCOPIC CHOLECYSTECTOMY ;  Surgeon: Arta Bruce Kinsinger, MD;  Location: [facility];  Service: General;  Laterality: N/A;  . Lymph node dissection N/A 12/24/2015    Procedure: POSSIBLE LYMPHADENECTOMY;  Surgeon: Arta Bruce Kinsinger, MD;  Location: [facility];  Service: General;  Laterality: N/A;    reports that he has been smoking Cigarettes.  He has a 17.5 pack-year smoking history. He has never used smokeless tobacco. He reports that he drinks about 8.4 oz of alcohol per week. He reports that he does not use illicit drugs. family history includes COPD in his mother; Colon cancer (age of onset: 45) in his father; Colon polyps in his father; Hyperthyroidism in his sister; Tremor in his mother. No Known Allergies Current Outpatient Prescriptions on File Prior to Visit  Medication Sig Dispense Refill  . acamprosate (CAMPRAL) 333 MG tablet Take 666 mg by mouth 3 (three) times daily with meals.    Marland Kitchen aspirin 81 MG chewable tablet Chew 81 mg by mouth daily.      Marland Kitchen atorvastatin (LIPITOR) 20 MG tablet Take 1 tablet (20 mg total) by mouth daily at 6 PM. 90 tablet 3  . Cholecalciferol 50000 units TABS Take 1 tablet by mouth once a week. 12 tablet 3  . indapamide (LOZOL) 1.25 MG tablet Take 1 tablet (1.25 mg total) by mouth daily. 30 tablet 3  . levothyroxine (SYNTHROID) 175 MCG tablet Take 1 tablet (175 mcg total) by mouth daily before breakfast. 90 tablet 1  . losartan (COZAAR) 50 MG  tablet TAKE 1 TABLET (50 MG TOTAL) BY MOUTH DAILY.  3  . metoprolol succinate (TOPROL-XL) 25 MG 24 hr tablet Take 1 tablet (25 mg total) by mouth daily. 90 tablet 3  . risperiDONE (RISPERDAL) 1 MG tablet Take 1 tablet (1 mg total) by mouth at bedtime. 30 tablet 1  . sildenafil (VIAGRA) 100 MG tablet Take 0.5-1 tablets (50-100 mg total) by mouth daily as needed for erectile dysfunction. 10 tablet 3  . spironolactone (ALDACTONE) 100 MG tablet Take 1 tablet (100 mg total) by mouth daily. 30 tablet 3  . thiamine (VITAMIN B-1) 100 MG tablet Take 100 mg by mouth daily.    Marland Kitchen torsemide (DEMADEX) 20 MG tablet Take 1 tablet (20 mg total) by mouth daily. 8 tablet 0   No current facility-administered medications on file prior to visit.   Review of  Systems  Constitutional: Negative for unusual diaphoresis or night sweats HENT: Negative for ear swelling or discharge Eyes: Negative for worsening visual haziness  Respiratory: Negative for choking and stridor.   Gastrointestinal: Negative for distension or worsening eructation Genitourinary: Negative for retention or change in urine volume.  Musculoskeletal: Negative for other MSK pain or swelling Skin: Negative for color change and worsening wound Neurological: Negative for tremors and numbness other than noted  Psychiatric/Behavioral: Negative for decreased concentration or agitation other than above       Objective:   Physical Exam BP 118/62 mmHg  Pulse 74  Resp 20  Wt 209 lb (94.802 kg)  SpO2 97% VS noted,  Constitutional: Pt appears in no apparent distress HENT: Head: NCAT.  Right Ear: External ear normal.  Left Ear: External ear normal.  Eyes: . Pupils are equal, round, and reactive to light. Conjunctivae and EOM are normal Neck: Normal range of motion. Neck supple.  Cardiovascular: Normal rate and regular rhythm.   Pulmonary/Chest: Effort normal and breath sounds decreased without rales or wheezing.  Abd:  Soft, NT, ND, + BS Neurological: Pt is alert. Not confused , motor grossly intact Skin: Skin is warm. No rash, 2+ LE edema to below the knees only (was above the knees to mid thigh per pt recently) Psychiatric: Pt behavior is normal. No agitation.   Most recent cxr:   IMPRESSION: 1. There is no evidence of CHF. 2. Subtle rounded density projecting over the anterior aspect of the right fifth rib which is more conspicuous than in the past but may reflect a confluence of densities. This may reflect focal pneumonia in the anterior aspect of the middle lobe. Followup PA and lateral chest X-ray is recommended in 3-4 weeks following trial of antibiotic therapy to ensure resolution and exclude underlying malignancy.   Electronically Signed  By: David Martinique  M.D.  On: 03/08/2016 15:19 Most recent LE venous doppler summary: Mar 12, 2016 Summary:  - No evidence of deep vein thrombosis involving the visualized  veins of the right lower extremity and left lower extremity.  Incidental findings are consistent with: enlarged lymph node on  the right and enlarged lymph node on the left. - No evidence of Baker&'s cyst on the right or left.  Other specific details can be found in the table(s) above. Prepared and Electronically Authenticated by  Jessy Oto. Fields MD 2017-05-14T11:14:44  Most recent labs: Lab Results  Component Value Date   WBC 5.1 03/12/2016   HGB 11.7* 03/12/2016   HCT 32.9* 03/12/2016   PLT 103* 03/12/2016   GLUCOSE 119* 03/12/2016   CHOL 115 02/19/2016  TRIG 58.0 02/19/2016   HDL 44.60 02/19/2016   LDLDIRECT 51.0 04/07/2015   LDLCALC 59 02/19/2016   ALT 14 03/12/2016   AST 30 03/12/2016   NA 143 03/12/2016   K 4.5 03/12/2016   CL 106 03/12/2016   CREATININE 1.01 03/12/2016   BUN 21* 03/12/2016   CO2 28 03/12/2016   TSH 0.22* 02/19/2016   PSA 0.26 06/14/2011   INR 1.39 12/19/2015   HGBA1C 6.0 02/19/2016   BNP 34.7 - Mar 12, 2016  Most recent echo:  July 2012 summary Result status: Final result    -------------------------------------------------------------------- Transthoracic Echocardiography  Patient:  Deloss, Rathel MR #:    [id number] Study Date: 05/06/2011 Gender:   M Age:    27 Height:   180.3cm Weight:   88.5kg BSA:    2.26m^2 Pt. Status: Room:  ATTENDING  Default, Provider W ORDERING   Penumalli, Vikram REFERRING  Hazel, Vikram PERFORMING  Zacarias Pontes, [facility] SONOGRAPHER Victorio Palm, RDCS cc:  -------------------------------------------------------------------- Indications:  CVA 34.  -------------------------------------------------------------------- History: PMH: Tremors. Migraines. Acquired from the patient and from the patient's  chart. Nausea and auditory loss. Stroke, evidenced by magnetic resonance imaging. An embolic source is suspected. Risk factors: Current tobacco use. Hypertension.  -------------------------------------------------------------------- Study Conclusions  - Left ventricle: The cavity size was normal. Wall thickness was normal. The estimated ejection fraction was 60%. Wall motion was normal; there were no regional wall motion abnormalities. - Pulmonary arteries: PA peak pressure: 77mm Hg (S). - Impressions: There is no obvious cardiac source of embolus. There is no obvious shunt at the atrial level. Impressions:  - There is no obvious cardiac source of embolus. There is no obvious shunt at the atrial level.       Assessment & Plan:

## 2016-03-18 NOTE — Assessment & Plan Note (Signed)
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

## 2016-03-18 NOTE — Progress Notes (Signed)
Pre visit review using our clinic review tool, if applicable. No additional management support is needed unless otherwise documented below in the visit note. 

## 2016-03-30 ENCOUNTER — Telehealth: Payer: Self-pay | Admitting: Internal Medicine

## 2016-03-30 NOTE — Telephone Encounter (Signed)
I did call pt. Pt called before I entered note.   LVM for pt to call back as soon as possible.   RE: regarding restrictions and how he has been feeling.

## 2016-03-30 NOTE — Telephone Encounter (Signed)
Alyssa called me back and stated that HR needed medical records from the last year. Informed pt that we needed a medical release form.   Brandy called back. fax number [phone]).  She mentioned that pt was in her office and she needed clarity about about the release of information. Explained that we were not able to release any medical information without pt signed consent. It did not have to be a form per say but some type of documentation.

## 2016-03-30 NOTE — Telephone Encounter (Signed)
No fax number on form.   lvm for Brandy in HR to call back.  RE: need fax number.

## 2016-03-30 NOTE — Telephone Encounter (Signed)
Pt called back.   Stated that he has not returned to work due to SOB sx. Pt states that the swelling sx has improved some.   Pt has a follow up appt with Dr. Ronnald Ramp on 04/01/2016.   Work restrictions form has been faxed back to [employer]. [place].

## 2016-03-30 NOTE — Telephone Encounter (Signed)
Patient called stating that he got a call. He couldn't tell me what it was about and there are no notes...  Was this you?

## 2016-04-01 ENCOUNTER — Ambulatory Visit (INDEPENDENT_AMBULATORY_CARE_PROVIDER_SITE_OTHER): Payer: Commercial Managed Care - PPO | Admitting: Cardiology

## 2016-04-01 ENCOUNTER — Encounter: Payer: Self-pay | Admitting: Cardiology

## 2016-04-01 ENCOUNTER — Other Ambulatory Visit (INDEPENDENT_AMBULATORY_CARE_PROVIDER_SITE_OTHER): Payer: Commercial Managed Care - PPO

## 2016-04-01 ENCOUNTER — Ambulatory Visit (INDEPENDENT_AMBULATORY_CARE_PROVIDER_SITE_OTHER)
Admission: RE | Admit: 2016-04-01 | Discharge: 2016-04-01 | Disposition: A | Discharge status: Home / Self Care | Payer: Commercial Managed Care - PPO | Source: Ambulatory Visit | Attending: Internal Medicine | Admitting: Internal Medicine

## 2016-04-01 ENCOUNTER — Encounter: Payer: Self-pay | Admitting: Internal Medicine

## 2016-04-01 ENCOUNTER — Ambulatory Visit (INDEPENDENT_AMBULATORY_CARE_PROVIDER_SITE_OTHER): Payer: Commercial Managed Care - PPO | Admitting: Internal Medicine

## 2016-04-01 VITALS — BP 108/62 | HR 68 | Ht 71.0 in | Wt 202.0 lb

## 2016-04-01 VITALS — BP 100/68 | HR 65 | Temp 98.6°F | Resp 16 | Ht 71.0 in | Wt 203.0 lb

## 2016-04-01 DIAGNOSIS — I10 Essential (primary) hypertension: Secondary | ICD-10-CM | POA: Diagnosis not present

## 2016-04-01 DIAGNOSIS — R05 Cough: Secondary | ICD-10-CM | POA: Insufficient documentation

## 2016-04-01 DIAGNOSIS — E038 Other specified hypothyroidism: Secondary | ICD-10-CM

## 2016-04-01 DIAGNOSIS — R0600 Dyspnea, unspecified: Secondary | ICD-10-CM | POA: Diagnosis not present

## 2016-04-01 DIAGNOSIS — D510 Vitamin B12 deficiency anemia due to intrinsic factor deficiency: Secondary | ICD-10-CM

## 2016-04-01 DIAGNOSIS — D509 Iron deficiency anemia, unspecified: Secondary | ICD-10-CM

## 2016-04-01 DIAGNOSIS — K7031 Alcoholic cirrhosis of liver with ascites: Secondary | ICD-10-CM | POA: Diagnosis not present

## 2016-04-01 DIAGNOSIS — J22 Unspecified acute lower respiratory infection: Secondary | ICD-10-CM

## 2016-04-01 DIAGNOSIS — F1021 Alcohol dependence, in remission: Secondary | ICD-10-CM

## 2016-04-01 DIAGNOSIS — F17200 Nicotine dependence, unspecified, uncomplicated: Secondary | ICD-10-CM

## 2016-04-01 DIAGNOSIS — Z720 Tobacco use: Secondary | ICD-10-CM

## 2016-04-01 DIAGNOSIS — J181 Lobar pneumonia, unspecified organism: Secondary | ICD-10-CM

## 2016-04-01 DIAGNOSIS — J988 Other specified respiratory disorders: Secondary | ICD-10-CM | POA: Diagnosis not present

## 2016-04-01 DIAGNOSIS — R059 Cough, unspecified: Secondary | ICD-10-CM

## 2016-04-01 DIAGNOSIS — J189 Pneumonia, unspecified organism: Secondary | ICD-10-CM | POA: Insufficient documentation

## 2016-04-01 LAB — CBC WITH DIFFERENTIAL/PLATELET
BASOS ABS: 0 10*3/uL (ref 0.0–0.1)
BASOS PCT: 0.4 % (ref 0.0–3.0)
EOS PCT: 3.9 % (ref 0.0–5.0)
Eosinophils Absolute: 0.2 10*3/uL (ref 0.0–0.7)
HEMATOCRIT: 36.4 % — AB (ref 39.0–52.0)
HEMOGLOBIN: 12.2 g/dL — AB (ref 13.0–17.0)
Lymphocytes Relative: 29.9 % (ref 12.0–46.0)
Lymphs Abs: 1.8 10*3/uL (ref 0.7–4.0)
MCHC: 33.5 g/dL (ref 30.0–36.0)
MCV: 92.5 fl (ref 78.0–100.0)
MONOS PCT: 11.9 % (ref 3.0–12.0)
Monocytes Absolute: 0.7 10*3/uL (ref 0.1–1.0)
Neutro Abs: 3.3 10*3/uL (ref 1.4–7.7)
Neutrophils Relative %: 53.9 % (ref 43.0–77.0)
Platelets: 119 10*3/uL — ABNORMAL LOW (ref 150.0–400.0)
RBC: 3.94 Mil/uL — AB (ref 4.22–5.81)
RDW: 19.9 % — ABNORMAL HIGH (ref 11.5–15.5)
WBC: 6.2 10*3/uL (ref 4.0–10.5)

## 2016-04-01 LAB — BASIC METABOLIC PANEL
BUN: 24 mg/dL — ABNORMAL HIGH (ref 6–23)
CHLORIDE: 105 meq/L (ref 96–112)
CO2: 27 mEq/L (ref 19–32)
Calcium: 10.3 mg/dL (ref 8.4–10.5)
Creatinine, Ser: 1.08 mg/dL (ref 0.40–1.50)
GFR: 75.42 mL/min (ref >60.00)
GLUCOSE: 98 mg/dL (ref 70–99)
POTASSIUM: 4.2 meq/L (ref 3.5–5.1)
SODIUM: 138 meq/L (ref 135–145)

## 2016-04-01 LAB — TSH: TSH: 0.36 u[IU]/mL (ref 0.35–4.50)

## 2016-04-01 MED ORDER — CEFDINIR 300 MG PO CAPS: 300.0000 mg | ORAL_CAPSULE | Freq: Two times a day (BID) | ORAL | Status: DC

## 2016-04-01 NOTE — Progress Notes (Signed)
Pre visit review using our clinic review tool, if applicable. No additional management support is needed unless otherwise documented below in the visit note. 

## 2016-04-01 NOTE — Patient Instructions (Signed)

## 2016-04-01 NOTE — Progress Notes (Signed)
Cardiology Office Note    Date:  04/01/2016   ID:  Boyd, Jon 1960-12-24, MRN [id number]  PCP:  Hoyt Koch, MD  Cardiologist:   Candee Furbish, MD     History of Present Illness:  Jon Boyd is a 55 y.o. male here for the evaluation of shortness of breath.Has alcoholic liver disease/cirrhosis with ascites.  Prior echocardiogram in 2012 was normal ejection fraction. On 03/12/16 lower extremity Doppler was performed and this was normal. Chest x-ray on 03/08/16 showed no evidence of heart failure. Personally viewed.  3-4 weeks SOB, worse with inhailer. Increased cough. No fever. Mucus yellow production. Had stroke 5 years ago.   CP. Edema improved.   +HTN, no DM, smoker - 6-7 day. Quit etoh 2 months ago.   Past Medical History  Diagnosis Date  . Hypertension   . Chronic headaches   . Hyperlipidemia   . Stroke ([place])   . Postablative hypothyroidism   . Edema 06/03/2014    Past Surgical History  Procedure Laterality Date  . Back surgery    . Ankle surgery    . Finger surgery  09/2012    gramig  . Colonoscopy    . Polypectomy    . Cholecystectomy N/A 12/24/2015    Procedure: LAPAROSCOPIC CHOLECYSTECTOMY ;  Surgeon: Arta Bruce Kinsinger, MD;  Location: [facility];  Service: General;  Laterality: N/A;  . Lymph node dissection N/A 12/24/2015    Procedure: POSSIBLE LYMPHADENECTOMY;  Surgeon: Arta Bruce Kinsinger, MD;  Location: [facility];  Service: General;  Laterality: N/A;    Current Medications: Outpatient Prescriptions Prior to Visit  Medication Sig Dispense Refill  . albuterol (PROVENTIL HFA;VENTOLIN HFA) 108 (90 Base) MCG/ACT inhaler Inhale 2 puffs into the lungs every 6 (six) hours as needed for wheezing or shortness of breath. 1 Inhaler 11  . aspirin 81 MG chewable tablet Chew 81 mg by mouth daily.      Marland Kitchen atorvastatin (LIPITOR) 20 MG tablet Take 1 tablet (20 mg total) by mouth daily at 6 PM. 90 tablet 3  . Cholecalciferol 50000 units TABS Take 1 tablet by mouth once  a week. 12 tablet 3  . indapamide (LOZOL) 1.25 MG tablet Take 1 tablet (1.25 mg total) by mouth daily. 30 tablet 3  . levothyroxine (SYNTHROID) 175 MCG tablet Take 1 tablet (175 mcg total) by mouth daily before breakfast. 90 tablet 1  . losartan (COZAAR) 50 MG tablet TAKE 1 TABLET (50 MG TOTAL) BY MOUTH DAILY.  3  . metoprolol succinate (TOPROL-XL) 25 MG 24 hr tablet Take 1 tablet (25 mg total) by mouth daily. 90 tablet 3  . risperiDONE (RISPERDAL) 1 MG tablet Take 1 tablet (1 mg total) by mouth at bedtime. 30 tablet 1  . sildenafil (VIAGRA) 100 MG tablet Take 0.5-1 tablets (50-100 mg total) by mouth daily as needed for erectile dysfunction. 10 tablet 3  . spironolactone (ALDACTONE) 100 MG tablet Take 1 tablet (100 mg total) by mouth daily. 30 tablet 3  . thiamine (VITAMIN B-1) 100 MG tablet Take 100 mg by mouth daily.    Marland Kitchen tiotropium (SPIRIVA HANDIHALER) 18 MCG inhalation capsule Place 1 capsule (18 mcg total) into inhaler and inhale daily. 30 capsule 12  . torsemide (DEMADEX) 20 MG tablet Take 1 tablet (20 mg total) by mouth daily. 8 tablet 0  . zolpidem (AMBIEN) 5 MG tablet Take 1 tablet (5 mg total) by mouth at bedtime as needed for sleep. 30 tablet 0  .  cefdinir (OMNICEF) 300 MG capsule Take 1 capsule (300 mg total) by mouth 2 (two) times daily. 20 capsule 1   No facility-administered medications prior to visit.     Allergies:   Review of patient's allergies indicates no known allergies.   Social History   Social History  . Marital Status: Single    Spouse Name: N/A  . Number of Children: N/A  . Years of Education: N/A   Social History Main Topics  . Smoking status: Current Every Day Smoker -- 0.50 packs/day for 35 years    Types: Cigarettes  . Smokeless tobacco: Never Used  . Alcohol Use: 8.4 oz/week    14 Cans of beer per week     Comment: dr has made him quit since 04-08-15,  pt admitted to drinking on 10/29/15  . Drug Use: No  . Sexual Activity: Not Asked   Other Topics  Concern  . None   Social History Narrative     Family History:  The patient's family history includes COPD in his mother; Colon cancer (age of onset: 73) in his father; Colon polyps in his father; Hyperthyroidism in his sister; Tremor in his mother.   ROS:   Please see the history of present illness.    ROS All other systems reviewed and are negative.   PHYSICAL EXAM:   VS:  BP 108/62 mmHg  Pulse 68  Ht 5\' 11"  (1.803 m)  Wt 202 lb (91.627 kg)  BMI 28.19 kg/m2   GEN: Well nourished, well developed, in no acute distress HEENT: normal Neck: no JVD, carotid bruits, or masses Cardiac: RRR; soft 1/6 systolic murmurs, no rubs, or gallops, trace to 1+ bilateral lower extremity edema  Respiratory:  Mild wheeze heard in posterior right lung base GI: soft, nontender, nondistended, + BS mild abdominal distention MS: no deformity or atrophy Skin: warm and dry, no rash Neuro:  Alert and Oriented x 3, Strength and sensation are intact Psych: euthymic mood, full affect  Wt Readings from Last 3 Encounters:  04/01/16 202 lb (91.627 kg)  04/01/16 203 lb (92.08 kg)  03/18/16 209 lb (94.802 kg)      Studies/Labs Reviewed:   EKG:  EKG is not ordered today.  Prior EKG from 10/29/15 shows sinus rhythm with no other abnormalities.  Recent Labs: 03/08/2016: Pro B Natriuretic peptide (BNP) 167.50* 03/12/2016: ALT 14; B Natriuretic Peptide 34.7 04/01/2016: BUN 24*; Creatinine, Ser 1.08; Hemoglobin 12.2*; Platelets 119.0*; Potassium 4.2; Sodium 138; TSH 0.36   Lipid Panel    Component Value Date/Time   CHOL 115 02/19/2016 1116   TRIG 58.0 02/19/2016 1116   HDL 44.60 02/19/2016 1116   CHOLHDL 3 02/19/2016 1116   VLDL 11.6 02/19/2016 1116   LDLCALC 59 02/19/2016 1116   LDLDIRECT 51.0 04/07/2015 0916    Additional studies/ records that were reviewed today include:  ER visit reviewed, lab work reviewed, lower 20 Dopplers normal, prior echocardiogram normal, chest x-ray  reviewed.    ASSESSMENT:    1. Dyspnea   2. Essential hypertension   3. Alcoholic cirrhosis of liver with ascites (HCC)   4. Smoker   5. Alcohol dependence in remission ([place])      PLAN:  In order of problems listed above:  Dyspnea/cough -This seems to have gotten worse despite inhalers. -Lower extremity edema has improved when increasing diuretics. -I do appreciate a subtle wheeze right posterior base upon exhalation. -Increased mucus production. He has not picked up his antibiotic yet. He was just prescribed.  Sounds like possible bronchitis. -Reassuring BNP. -We will check echocardiogram to ensure proper structure and function of his heart into ensure that he does not have a degree of systolic heart failure.  Cirrhosis of liver with ascites -Diuretics per primary team. -Edema has improved. -Continue to encourage alcohol cessation.  Tobacco use -He has cut back to 6-7 cigarettes a day. He used to smoke 2 packs a day.  Alcohol use -Quit alcohol 60 days ago.     Medication Adjustments/Labs and Tests Ordered: Current medicines are reviewed at length with the patient today.  Concerns regarding medicines are outlined above.  Medication changes, Labs and Tests ordered today are listed in the Patient Instructions below. Patient Instructions  Medication Instructions:  The current medical regimen is effective;  continue present plan and medications.  Testing/Procedures: Your physician has requested that you have an echocardiogram. Echocardiography is a painless test that uses sound waves to create images of your heart. It provides your doctor with information about the size and shape of your heart and how well your heart's chambers and valves are working. This procedure takes approximately one hour. There are no restrictions for this procedure.  Follow-Up: Follow up as needed after testing.  If you need a refill on your cardiac medications before your next appointment,  please call your pharmacy.  Thank you for choosing [facility]!!           Signed, Candee Furbish, MD  04/01/2016 4:24 PM    [address], [place], [place]  [address] Phone: [phone]; Fax: [phone]

## 2016-04-01 NOTE — Progress Notes (Signed)
Subjective:  Patient ID: Jon Boyd, male    DOB: 1961/03/24  Age: 55 y.o. MRN: [id number]  CC: Cough   HPI Jon Boyd presents for follow-up on leg edema and worsening cough.  He was seen about 3 weeks ago for lower extremity edema and underwent an ultrasound of the lower extremities which was negative for deep venous thromboses. He was placed on a diuretic and has had a dramatic improvement. He has no pain or swelling in his legs at this time.   He complains for the last few weeks that he has had a worsening cough. He does have a chronic cough and shortness of breath but over the last few weeks the cough has become productive of yellow/green/occasionally blood-tinged phlegm. He denies fever, chills, or night sweats.  Outpatient Prescriptions Prior to Visit  Medication Sig Dispense Refill  . albuterol (PROVENTIL HFA;VENTOLIN HFA) 108 (90 Base) MCG/ACT inhaler Inhale 2 puffs into the lungs every 6 (six) hours as needed for wheezing or shortness of breath. 1 Inhaler 11  . aspirin 81 MG chewable tablet Chew 81 mg by mouth daily.      Marland Kitchen atorvastatin (LIPITOR) 20 MG tablet Take 1 tablet (20 mg total) by mouth daily at 6 PM. 90 tablet 3  . Cholecalciferol 50000 units TABS Take 1 tablet by mouth once a week. 12 tablet 3  . indapamide (LOZOL) 1.25 MG tablet Take 1 tablet (1.25 mg total) by mouth daily. 30 tablet 3  . levothyroxine (SYNTHROID) 175 MCG tablet Take 1 tablet (175 mcg total) by mouth daily before breakfast. 90 tablet 1  . losartan (COZAAR) 50 MG tablet TAKE 1 TABLET (50 MG TOTAL) BY MOUTH DAILY.  3  . metoprolol succinate (TOPROL-XL) 25 MG 24 hr tablet Take 1 tablet (25 mg total) by mouth daily. 90 tablet 3  . risperiDONE (RISPERDAL) 1 MG tablet Take 1 tablet (1 mg total) by mouth at bedtime. 30 tablet 1  . sildenafil (VIAGRA) 100 MG tablet Take 0.5-1 tablets (50-100 mg total) by mouth daily as needed for erectile dysfunction. 10 tablet 3  . spironolactone (ALDACTONE) 100 MG  tablet Take 1 tablet (100 mg total) by mouth daily. 30 tablet 3  . thiamine (VITAMIN B-1) 100 MG tablet Take 100 mg by mouth daily.    Marland Kitchen tiotropium (SPIRIVA HANDIHALER) 18 MCG inhalation capsule Place 1 capsule (18 mcg total) into inhaler and inhale daily. 30 capsule 12  . torsemide (DEMADEX) 20 MG tablet Take 1 tablet (20 mg total) by mouth daily. 8 tablet 0  . zolpidem (AMBIEN) 5 MG tablet Take 1 tablet (5 mg total) by mouth at bedtime as needed for sleep. 30 tablet 0  . acamprosate (CAMPRAL) 333 MG tablet Take 666 mg by mouth 3 (three) times daily with meals.     No facility-administered medications prior to visit.    ROS Review of Systems  Constitutional: Negative.  Negative for fever, chills, diaphoresis, activity change, appetite change, fatigue and unexpected weight change.  HENT: Negative.  Negative for congestion, facial swelling, sinus pressure, sore throat, trouble swallowing and voice change.   Eyes: Negative.   Respiratory: Positive for cough and shortness of breath. Negative for choking, chest tightness, wheezing and stridor.   Cardiovascular: Negative.  Negative for chest pain, palpitations and leg swelling.  Gastrointestinal: Negative.  Negative for nausea, vomiting, abdominal pain, diarrhea, constipation and blood in stool.  Endocrine: Negative.   Genitourinary: Negative.  Negative for difficulty urinating.  Musculoskeletal: Negative.  Negative for myalgias, back pain, joint swelling, arthralgias and neck pain.  Skin: Negative.  Negative for color change and rash.  Allergic/Immunologic: Negative.   Neurological: Negative.  Negative for dizziness and light-headedness.  Hematological: Negative.  Negative for adenopathy. Does not bruise/bleed easily.  Psychiatric/Behavioral: Negative.     Objective:  BP 100/68 mmHg  Pulse 65  Temp(Src) 98.6 F (37 C) (Oral)  Resp 16  Ht 5\' 11"  (1.803 m)  Wt 203 lb (92.08 kg)  BMI 28.33 kg/m2  SpO2 97%  BP Readings from Last 3  Encounters:  04/01/16 108/62  04/01/16 100/68  03/18/16 118/62    Wt Readings from Last 3 Encounters:  04/01/16 202 lb (91.627 kg)  04/01/16 203 lb (92.08 kg)  03/18/16 209 lb (94.802 kg)    Physical Exam  Constitutional: He is oriented to person, place, and time. He appears well-developed and well-nourished.  Non-toxic appearance. He does not have a sickly appearance. He does not appear ill. No distress.  HENT:  Head: Normocephalic and atraumatic.  Mouth/Throat: Oropharynx is clear and moist. No oropharyngeal exudate.  Eyes: Conjunctivae are normal. Right eye exhibits no discharge. Left eye exhibits no discharge. No scleral icterus.  Neck: Normal range of motion. Neck supple. No JVD present. No tracheal deviation present. No thyromegaly present.  Cardiovascular: Normal rate, regular rhythm, normal heart sounds and intact distal pulses.  Exam reveals no gallop and no friction rub.   No murmur heard. Pulses:      Carotid pulses are 1+ on the right side, and 1+ on the left side.      Radial pulses are 1+ on the right side, and 1+ on the left side.       Femoral pulses are 1+ on the right side, and 1+ on the left side.      Popliteal pulses are 1+ on the right side, and 1+ on the left side.       Dorsalis pedis pulses are 1+ on the right side, and 1+ on the left side.       Posterior tibial pulses are 1+ on the right side, and 1+ on the left side.  Pulmonary/Chest: Effort normal and breath sounds normal. No stridor. No respiratory distress. He has no wheezes. He has no rales. He exhibits no tenderness.  Abdominal: Soft. Bowel sounds are normal. He exhibits no distension and no mass. There is no tenderness. There is no rebound and no guarding.  Musculoskeletal: Normal range of motion. He exhibits no edema or tenderness.  There is no edema in his lower extremities, the calves are nontender  Lymphadenopathy:    He has no cervical adenopathy.  Neurological: He is oriented to person, place,  and time.  Skin: Skin is warm and dry. No rash noted. He is not diaphoretic. No erythema. No pallor.  Vitals reviewed.   Lab Results  Component Value Date   WBC 6.2 04/01/2016   HGB 12.2* 04/01/2016   HCT 36.4* 04/01/2016   PLT 119.0* 04/01/2016   GLUCOSE 98 04/01/2016   CHOL 115 02/19/2016   TRIG 58.0 02/19/2016   HDL 44.60 02/19/2016   LDLDIRECT 51.0 04/07/2015   LDLCALC 59 02/19/2016   ALT 14 03/12/2016   AST 30 03/12/2016   NA 138 04/01/2016   K 4.2 04/01/2016   CL 105 04/01/2016   CREATININE 1.08 04/01/2016   BUN 24* 04/01/2016   CO2 27 04/01/2016   TSH 0.36 04/01/2016   PSA 0.26 06/14/2011   INR 1.39 12/19/2015  HGBA1C 6.0 02/19/2016   Dg Chest 2 View  04/01/2016  CLINICAL DATA:  Bilateral lower extremity edema 3 weeks. Cough 10 days. EXAM: CHEST  2 VIEW COMPARISON:  03/08/2016 FINDINGS: Lungs are adequately inflated with new focal opacification over the anterior medial right middle lobe with convex lateral border. No evidence of effusion. Previously seen faint nodule opacity over the right midlung not well-visualized. Cardiomediastinal silhouette and remainder of the exam is unchanged. IMPRESSION: Focal opacification over the anterior medial right middle lobe new compared to 03/08/2016 suggesting infection. Recommend follow-up chest radiograph in 3-4 weeks. Electronically Signed   By: Marin Olp M.D.   On: 04/01/2016 17:33   Dg Chest 2 View  03/08/2016  CLINICAL DATA:  Three weeks of bilateral lower extremity swelling and shortness of breath, current smoker, history of cirrhosis, previous CVA. EXAM: CHEST  2 VIEW COMPARISON:  Chest x-ray of February 01, 2016 FINDINGS: The lungs are adequately inflated. There is no focal infiltrate. There are is subtle nodular density projecting over the anterior aspect of the right fifth rib which is new since previous studies. The heart is normal in size. The pulmonary vascularity is not engorged. The mediastinum is normal in width. There is  no pleural effusion. The bony thorax exhibits no acute abnormality. IMPRESSION: 1. There is no evidence of CHF. 2. Subtle rounded density projecting over the anterior aspect of the right fifth rib which is more conspicuous than in the past but may reflect a confluence of densities. This may reflect focal pneumonia in the anterior aspect of the middle lobe. Followup PA and lateral chest X-ray is recommended in 3-4 weeks following trial of antibiotic therapy to ensure resolution and exclude underlying malignancy. Electronically Signed   By: David  Martinique M.D.   On: 03/08/2016 15:19   No results found.  Assessment & Plan:   Thomson was seen today for cough.  Diagnoses and all orders for this visit:  Essential hypertension- his blood pressure is well-controlled, electrolytes and renal function are stable. -     Basic metabolic panel; Future  Other specified hypothyroidism- his TSH is in the normal range, he will remain on the current dose of levothyroxine -     TSH; Future  Pernicious anemia- improvement noted, will continue to monitor his vitamin levels during future visits -     CBC with Differential/Platelet; Future  Iron deficiency anemia- improvement noted -     CBC with Differential/Platelet; Future  Cough- his chest x-ray is positive for new right middle lobe pneumonia -     DG Chest 2 View; Future  LRTI (lower respiratory tract infection)- I will treat the infection with Omnicef -     Discontinue: cefdinir (OMNICEF) 300 MG capsule; Take 1 capsule (300 mg total) by mouth 2 (two) times daily. -     DG Chest 2 View; Future   I have discontinued Mr. Jagodzinski acamprosate. I am also having him maintain his aspirin, sildenafil, atorvastatin, thiamine, Cholecalciferol, levothyroxine, losartan, spironolactone, indapamide, risperiDONE, metoprolol succinate, torsemide, albuterol, tiotropium, and zolpidem.  Meds ordered this encounter  Medications  . DISCONTD: cefdinir (OMNICEF) 300 MG capsule      Sig: Take 1 capsule (300 mg total) by mouth 2 (two) times daily.    Dispense:  20 capsule    Refill:  1     Follow-up: Return in about 3 weeks (around 04/22/2016).  Scarlette Calico, MD

## 2016-04-01 NOTE — Patient Instructions (Addendum)
Medication Instructions:  The current medical regimen is effective;  continue present plan and medications.  Testing/Procedures: Your physician has requested that you have an echocardiogram. Echocardiography is a painless test that uses sound waves to create images of your heart. It provides your doctor with information about the size and shape of your heart and how well your heart's chambers and valves are working. This procedure takes approximately one hour. There are no restrictions for this procedure.  Follow-Up: Follow up as needed after testing.  If you need a refill on your cardiac medications before your next appointment, please call your pharmacy.  Thank you for choosing [facility]!!

## 2016-04-05 NOTE — Telephone Encounter (Signed)
Pt is calling back tomorrow for medical records tomorrow. He wanted to pick up the records for his employer.

## 2016-04-05 NOTE — Telephone Encounter (Signed)
Patient called in to advise that he has given a release form to [place] as of last week. He states that you called for him. Please touch base with this patient

## 2016-04-07 ENCOUNTER — Ambulatory Visit (INDEPENDENT_AMBULATORY_CARE_PROVIDER_SITE_OTHER)
Admission: RE | Admit: 2016-04-07 | Discharge: 2016-04-07 | Disposition: A | Discharge status: Home / Self Care | Payer: Commercial Managed Care - PPO | Source: Ambulatory Visit | Attending: Internal Medicine | Admitting: Internal Medicine

## 2016-04-07 ENCOUNTER — Ambulatory Visit: Payer: PRIVATE HEALTH INSURANCE | Admitting: Internal Medicine

## 2016-04-07 ENCOUNTER — Encounter: Payer: Self-pay | Admitting: Internal Medicine

## 2016-04-07 ENCOUNTER — Ambulatory Visit (INDEPENDENT_AMBULATORY_CARE_PROVIDER_SITE_OTHER): Payer: Commercial Managed Care - PPO | Admitting: Internal Medicine

## 2016-04-07 VITALS — BP 106/60 | HR 78 | Temp 97.8°F | Resp 16 | Ht 71.0 in | Wt 202.0 lb

## 2016-04-07 DIAGNOSIS — J189 Pneumonia, unspecified organism: Secondary | ICD-10-CM

## 2016-04-07 DIAGNOSIS — J181 Lobar pneumonia, unspecified organism: Principal | ICD-10-CM

## 2016-04-07 DIAGNOSIS — J449 Chronic obstructive pulmonary disease, unspecified: Secondary | ICD-10-CM

## 2016-04-07 MED ORDER — TIOTROPIUM BROMIDE-OLODATEROL 2.5-2.5 MCG/ACT IN AERS: 2.0000 | INHALATION_SPRAY | Freq: Every day | RESPIRATORY_TRACT | Status: DC

## 2016-04-07 NOTE — Progress Notes (Signed)
Subjective:  Patient ID: Jon Boyd, male    DOB: Mar 25, 1961  Age: 55 y.o. MRN: [id number]  CC: Cough   HPI Jon Boyd presents for follow-up on cough. His cough is improving but is still productive of yellow-green phlegm. His last episode of fever was 3 days ago. He has had no recent chills or hemoptysis. He complains of persistent wheezing and shortness of breath. He denies chest pain, night sweats, or loss of appetite.  Outpatient Prescriptions Prior to Visit  Medication Sig Dispense Refill  . albuterol (PROVENTIL HFA;VENTOLIN HFA) 108 (90 Base) MCG/ACT inhaler Inhale 2 puffs into the lungs every 6 (six) hours as needed for wheezing or shortness of breath. 1 Inhaler 11  . aspirin 81 MG chewable tablet Chew 81 mg by mouth daily.      Marland Kitchen atorvastatin (LIPITOR) 20 MG tablet Take 1 tablet (20 mg total) by mouth daily at 6 PM. 90 tablet 3  . Cholecalciferol 50000 units TABS Take 1 tablet by mouth once a week. 12 tablet 3  . indapamide (LOZOL) 1.25 MG tablet Take 1 tablet (1.25 mg total) by mouth daily. 30 tablet 3  . levothyroxine (SYNTHROID) 175 MCG tablet Take 1 tablet (175 mcg total) by mouth daily before breakfast. 90 tablet 1  . losartan (COZAAR) 50 MG tablet TAKE 1 TABLET (50 MG TOTAL) BY MOUTH DAILY.  3  . metoprolol succinate (TOPROL-XL) 25 MG 24 hr tablet Take 1 tablet (25 mg total) by mouth daily. 90 tablet 3  . risperiDONE (RISPERDAL) 1 MG tablet Take 1 tablet (1 mg total) by mouth at bedtime. 30 tablet 1  . sildenafil (VIAGRA) 100 MG tablet Take 0.5-1 tablets (50-100 mg total) by mouth daily as needed for erectile dysfunction. 10 tablet 3  . spironolactone (ALDACTONE) 100 MG tablet Take 1 tablet (100 mg total) by mouth daily. 30 tablet 3  . thiamine (VITAMIN B-1) 100 MG tablet Take 100 mg by mouth daily.    Marland Kitchen torsemide (DEMADEX) 20 MG tablet Take 1 tablet (20 mg total) by mouth daily. 8 tablet 0  . zolpidem (AMBIEN) 5 MG tablet Take 1 tablet (5 mg total) by mouth at bedtime as  needed for sleep. 30 tablet 0  . tiotropium (SPIRIVA HANDIHALER) 18 MCG inhalation capsule Place 1 capsule (18 mcg total) into inhaler and inhale daily. 30 capsule 12   No facility-administered medications prior to visit.    ROS Review of Systems  Constitutional: Positive for fatigue. Negative for fever, chills, diaphoresis and appetite change.  HENT: Negative.   Eyes: Negative.   Respiratory: Positive for cough, shortness of breath and wheezing. Negative for apnea, choking, chest tightness and stridor.   Cardiovascular: Negative.  Negative for chest pain, palpitations and leg swelling.  Gastrointestinal: Negative.  Negative for nausea, vomiting, abdominal pain, diarrhea, constipation and blood in stool.  Endocrine: Negative.   Genitourinary: Negative.   Musculoskeletal: Negative.  Negative for myalgias, back pain, joint swelling, arthralgias and neck pain.  Skin: Negative.  Negative for color change and rash.  Allergic/Immunologic: Negative.   Neurological: Negative.  Negative for dizziness, tremors, weakness, light-headedness and numbness.  Hematological: Negative.  Negative for adenopathy. Does not bruise/bleed easily.  Psychiatric/Behavioral: Negative.     Objective:  BP 106/60 mmHg  Pulse 78  Temp(Src) 97.8 F (36.6 C) (Oral)  Resp 16  Ht 5\' 11"  (1.803 m)  Wt 202 lb (91.627 kg)  BMI 28.19 kg/m2  SpO2 96%  BP Readings from Last 3 Encounters:  04/07/16 106/60  04/01/16 108/62  04/01/16 100/68    Wt Readings from Last 3 Encounters:  04/07/16 202 lb (91.627 kg)  04/01/16 202 lb (91.627 kg)  04/01/16 203 lb (92.08 kg)    Physical Exam  Constitutional: He is oriented to person, place, and time. No distress.  HENT:  Mouth/Throat: Oropharynx is clear and moist. No oropharyngeal exudate.  Eyes: Conjunctivae are normal. Right eye exhibits no discharge. Left eye exhibits no discharge. No scleral icterus.  Neck: Normal range of motion. Neck supple. No JVD present. No  tracheal deviation present. No thyromegaly present.  Cardiovascular: Normal rate, regular rhythm, normal heart sounds and intact distal pulses.  Exam reveals no gallop and no friction rub.   No murmur heard. Pulmonary/Chest: Effort normal. No stridor. No respiratory distress. He has no decreased breath sounds. He has wheezes in the right middle field and the left middle field. He has rhonchi in the right middle field and the left middle field. He has no rales. He exhibits no tenderness.  There are mid zone wheezes and rhonchi but he has good air movement  Abdominal: Soft. Bowel sounds are normal. He exhibits no distension and no mass. There is no tenderness. There is no rebound and no guarding.  Musculoskeletal: Normal range of motion. He exhibits no edema or tenderness.  Lymphadenopathy:    He has no cervical adenopathy.  Neurological: He is oriented to person, place, and time.  Skin: Skin is warm and dry. No rash noted. He is not diaphoretic. No erythema. No pallor.  Vitals reviewed.   Lab Results  Component Value Date   WBC 6.2 04/01/2016   HGB 12.2* 04/01/2016   HCT 36.4* 04/01/2016   PLT 119.0* 04/01/2016   GLUCOSE 98 04/01/2016   CHOL 115 02/19/2016   TRIG 58.0 02/19/2016   HDL 44.60 02/19/2016   LDLDIRECT 51.0 04/07/2015   LDLCALC 59 02/19/2016   ALT 14 03/12/2016   AST 30 03/12/2016   NA 138 04/01/2016   K 4.2 04/01/2016   CL 105 04/01/2016   CREATININE 1.08 04/01/2016   BUN 24* 04/01/2016   CO2 27 04/01/2016   TSH 0.36 04/01/2016   PSA 0.26 06/14/2011   INR 1.39 12/19/2015   HGBA1C 6.0 02/19/2016    Dg Chest 2 View  04/01/2016  CLINICAL DATA:  Bilateral lower extremity edema 3 weeks. Cough 10 days. EXAM: CHEST  2 VIEW COMPARISON:  03/08/2016 FINDINGS: Lungs are adequately inflated with new focal opacification over the anterior medial right middle lobe with convex lateral border. No evidence of effusion. Previously seen faint nodule opacity over the right midlung not  well-visualized. Cardiomediastinal silhouette and remainder of the exam is unchanged. IMPRESSION: Focal opacification over the anterior medial right middle lobe new compared to 03/08/2016 suggesting infection. Recommend follow-up chest radiograph in 3-4 weeks. Electronically Signed   By: Marin Olp M.D.   On: 04/01/2016 17:33    Assessment & Plan:   Willey was seen today for cough.  Diagnoses and all orders for this visit:  RML pneumonia- based on his symptoms and x-ray appearance this is improving, he will complete the course of antibiotics at a been prescribed. -     DG Chest 2 View; Future  COPD mixed type ([place])- he has persistent symptoms despite using Spiriva inhaler, I think he would benefit from adding a LABA to the anticholinergic so I have asked him to start using Stiolto Respimat. I gave him a sample of this inhaler and showed him  how to use it, he demonstrated proficiency with its use. -     Discontinue: Tiotropium Bromide-Olodaterol (STIOLTO RESPIMAT) 2.5-2.5 MCG/ACT AERS; Inhale 2 puffs into the lungs daily. -     Tiotropium Bromide-Olodaterol (STIOLTO RESPIMAT) 2.5-2.5 MCG/ACT AERS; Inhale 2 puffs into the lungs daily.   I have discontinued Mr. Ferrel tiotropium. I am also having him maintain his aspirin, sildenafil, atorvastatin, thiamine, Cholecalciferol, levothyroxine, losartan, spironolactone, indapamide, risperiDONE, metoprolol succinate, torsemide, albuterol, zolpidem, and Tiotropium Bromide-Olodaterol.  Meds ordered this encounter  Medications  . DISCONTD: Tiotropium Bromide-Olodaterol (STIOLTO RESPIMAT) 2.5-2.5 MCG/ACT AERS    Sig: Inhale 2 puffs into the lungs daily.    Dispense:  4 g    Refill:  11  . Tiotropium Bromide-Olodaterol (STIOLTO RESPIMAT) 2.5-2.5 MCG/ACT AERS    Sig: Inhale 2 puffs into the lungs daily.    Dispense:  4 g    Refill:  11     Follow-up: Return in about 3 weeks (around 04/28/2016).  Scarlette Calico, MD

## 2016-04-07 NOTE — Patient Instructions (Signed)

## 2016-04-07 NOTE — Progress Notes (Signed)
Pre visit review using our clinic review tool, if applicable. No additional management support is needed unless otherwise documented below in the visit note. 

## 2016-04-17 ENCOUNTER — Other Ambulatory Visit: Payer: Self-pay | Admitting: Internal Medicine

## 2016-04-19 NOTE — Telephone Encounter (Signed)
Faxed script back to [facility].../lmb 

## 2016-04-21 ENCOUNTER — Other Ambulatory Visit: Payer: Self-pay

## 2016-04-21 ENCOUNTER — Ambulatory Visit (HOSPITAL_COMMUNITY): Payer: Commercial Managed Care - PPO | Attending: Cardiovascular Disease

## 2016-04-21 DIAGNOSIS — R0600 Dyspnea, unspecified: Secondary | ICD-10-CM | POA: Insufficient documentation

## 2016-04-21 DIAGNOSIS — Z720 Tobacco use: Secondary | ICD-10-CM | POA: Insufficient documentation

## 2016-04-21 DIAGNOSIS — I10 Essential (primary) hypertension: Secondary | ICD-10-CM | POA: Diagnosis not present

## 2016-04-21 DIAGNOSIS — I071 Rheumatic tricuspid insufficiency: Secondary | ICD-10-CM | POA: Insufficient documentation

## 2016-04-21 LAB — ECHOCARDIOGRAM COMPLETE
CHL CUP MV DEC (S): 282
E decel time: 282 msec
E/e' ratio: 6.24
FS: 35 % (ref 28–44)
IVS/LV PW RATIO, ED: 0.98
LA ID, A-P, ES: 42 mm
LA diam end sys: 42 mm
LA vol A4C: 49 ml
LA vol index: 28.8 mL/m2
LADIAMINDEX: 1.98 cm/m2
LAVOL: 61 mL
LV E/e' medial: 6.24
LV E/e'average: 6.24
LV PW d: 7.35 mm — AB (ref 0.6–1.1)
LV TDI E'LATERAL: 15.2
LV TDI E'MEDIAL: 9.98
LV e' LATERAL: 15.2 cm/s
LVOT SV: 81 mL
LVOT VTI: 23.4 cm
LVOT area: 3.46 cm2
LVOT diameter: 21 mm
LVOT peak vel: 106 cm/s
MV pk A vel: 92.8 m/s
MV pk E vel: 94.8 m/s
MVPG: 4 mmHg
Reg peak vel: 232 cm/s
TR max vel: 232 cm/s

## 2016-04-26 ENCOUNTER — Ambulatory Visit (INDEPENDENT_AMBULATORY_CARE_PROVIDER_SITE_OTHER): Payer: Commercial Managed Care - PPO | Admitting: Internal Medicine

## 2016-04-26 ENCOUNTER — Encounter: Payer: Self-pay | Admitting: Internal Medicine

## 2016-04-26 VITALS — BP 102/50 | HR 79 | Temp 98.7°F | Resp 16 | Ht 71.0 in | Wt 205.0 lb

## 2016-04-26 DIAGNOSIS — I8312 Varicose veins of left lower extremity with inflammation: Secondary | ICD-10-CM

## 2016-04-26 DIAGNOSIS — I8311 Varicose veins of right lower extremity with inflammation: Secondary | ICD-10-CM

## 2016-04-26 DIAGNOSIS — J449 Chronic obstructive pulmonary disease, unspecified: Secondary | ICD-10-CM | POA: Diagnosis not present

## 2016-04-26 DIAGNOSIS — I872 Venous insufficiency (chronic) (peripheral): Secondary | ICD-10-CM

## 2016-04-26 MED ORDER — TRIAMCINOLONE ACETONIDE 0.5 % EX CREA: 1.0000 "application " | TOPICAL_CREAM | Freq: Three times a day (TID) | CUTANEOUS | Status: DC

## 2016-04-26 NOTE — Patient Instructions (Signed)
Stasis Dermatitis Stasis dermatitis occurs when veins lose the ability to pump blood back to the heart (poor venous circulation). It causes a reddish-purple to brownish scaly, itchy rash on the legs. The rash comes from pooling of blood (stasis). CAUSES  This occurs because the veins do not work very well anymore or because pressure may be increased in the veins due to other conditions. With blood pooling, the increased pressure in the tiny blood vessels (capillaries) causes fluid to leak out of the capillaries into the tissue. The extra fluid makes it harder for the blood to feed the cells and get rid of waste products. SYMPTOMS  Stasis dermatitis appears as red, scaly, itchy patches on the legs. A yellowish or light brown discoloration is also present. Due to scratching or other injury, these patches can become an ulcer. This ulcer may remain for long periods of time. The ulcer can also become infected. Swelling of the legs is often present with stasis dermatitis. If the leg is swollen, this increases the risk of infection and further damage to the skin. Sometimes, intense itching, tingling, and burning occurs before signs of stasis dermatitis appear. You may find yourself scratching the insides of your ankles or rubbing your ankles together before the rash appears. After healing, there are often brown spots on the affected skin. DIAGNOSIS  Your caregiver makes this diagnosis based on an exam. Other tests may be done to better understand the cause. TREATMENT  If underlying conditions are present, they must be treated. Some of these conditions are heart failure, thyroid problems, poor nutrition, and varicose veins.  Cortisone creams and ointments applied to the skin (topically) may be needed, as well as medicine to reduce swelling in the legs (diuretics).  Compression stockings or an elastic wrap may also be needed to reduce swelling.  If there is an infection, antibiotic medicines may also be  used. HOME CARE INSTRUCTIONS   Try to rest and raise (elevate) the affected leg above the level of the heart, if possible.  Burow's solution wet packs applied for 30 minutes, 3 times daily, will help the weepy rash. Stop using the packs before your skin gets too dry. You can also use a mixture of 3 parts white vinegar to 1 quart water.  Grease your legs daily with ointments, such as petroleum jelly, to fight dryness.  Avoid scratching or injuring the area. SEEK IMMEDIATE MEDICAL CARE IF:   Your rash gets worse.  An ulcer forms.  You have an oral temperature above 102 F (38.9 C), not controlled by medicine.  You have any other severe symptoms.   This information is not intended to replace advice given to you by your health care provider. Make sure you discuss any questions you have with your health care provider.   Document Released: 01/27/2006 Document Revised: 01/10/2012 Document Reviewed: 03/05/2015 Elsevier Interactive Patient Education [employer].

## 2016-04-26 NOTE — Progress Notes (Signed)
Subjective:  Patient ID: Jon Boyd, male    DOB: 06-14-1961  Age: 55 y.o. MRN: [id number]  CC: Rash and COPD   HPI Jon Boyd presents for Concerns about a rash on his lower legs as well as a follow-up on COPD.  He complains of an itchy rash on both lower legs that he feels a compulsion to scratch. When he scratches the rash he develops small bloody scabbed lesions. He has mild swelling in his lower extremities that has not worsened over the last few weeks.  He has started using Stiolto for COPD and says that he has improved in some ways, less cough and wheezing, but he still has a significant amount of shortness of breath, dyspnea on exertion with fatigue. He wonders if he needs to be on oxygen therapy.  Outpatient Prescriptions Prior to Visit  Medication Sig Dispense Refill  . albuterol (PROVENTIL HFA;VENTOLIN HFA) 108 (90 Base) MCG/ACT inhaler Inhale 2 puffs into the lungs every 6 (six) hours as needed for wheezing or shortness of breath. 1 Inhaler 11  . aspirin 81 MG chewable tablet Chew 81 mg by mouth daily.      Marland Kitchen atorvastatin (LIPITOR) 20 MG tablet Take 1 tablet (20 mg total) by mouth daily at 6 PM. 90 tablet 3  . Cholecalciferol 50000 units TABS Take 1 tablet by mouth once a week. 12 tablet 3  . indapamide (LOZOL) 1.25 MG tablet Take 1 tablet (1.25 mg total) by mouth daily. 30 tablet 3  . levothyroxine (SYNTHROID) 175 MCG tablet Take 1 tablet (175 mcg total) by mouth daily before breakfast. 90 tablet 1  . losartan (COZAAR) 50 MG tablet TAKE 1 TABLET (50 MG TOTAL) BY MOUTH DAILY.  3  . metoprolol succinate (TOPROL-XL) 25 MG 24 hr tablet Take 1 tablet (25 mg total) by mouth daily. 90 tablet 3  . risperiDONE (RISPERDAL) 1 MG tablet Take 1 tablet (1 mg total) by mouth at bedtime. 30 tablet 1  . sildenafil (VIAGRA) 100 MG tablet Take 0.5-1 tablets (50-100 mg total) by mouth daily as needed for erectile dysfunction. 10 tablet 3  . spironolactone (ALDACTONE) 100 MG tablet Take 1  tablet (100 mg total) by mouth daily. 30 tablet 3  . thiamine (VITAMIN B-1) 100 MG tablet Take 100 mg by mouth daily.    . Tiotropium Bromide-Olodaterol (STIOLTO RESPIMAT) 2.5-2.5 MCG/ACT AERS Inhale 2 puffs into the lungs daily. 4 g 11  . torsemide (DEMADEX) 20 MG tablet Take 1 tablet (20 mg total) by mouth daily. 8 tablet 0  . zolpidem (AMBIEN) 5 MG tablet TAKE 1 TABLET AT BEDTIME AS NEEDED FOR SLEEP 30 tablet 0   No facility-administered medications prior to visit.    ROS Review of Systems  Constitutional: Positive for fatigue. Negative for fever, chills, diaphoresis and appetite change.  HENT: Negative.   Eyes: Negative.   Respiratory: Positive for cough (NP). Negative for choking, shortness of breath, wheezing and stridor.   Cardiovascular: Negative.  Negative for chest pain, palpitations and leg swelling.  Gastrointestinal: Negative.  Negative for nausea, vomiting, abdominal pain, diarrhea, constipation and blood in stool.  Endocrine: Negative.   Genitourinary: Negative.   Musculoskeletal: Negative.  Negative for myalgias, back pain, joint swelling and arthralgias.  Skin: Positive for rash. Negative for color change.  Neurological: Negative.  Negative for dizziness.  Hematological: Negative.  Negative for adenopathy. Does not bruise/bleed easily.  Psychiatric/Behavioral: Negative.     Objective:  BP 102/50 mmHg  Pulse 79  Temp(Src) 98.7 F (37.1 C) (Oral)  Resp 16  Ht 5\' 11"  (1.803 m)  Wt 205 lb (92.987 kg)  BMI 28.60 kg/m2  SpO2 97%  BP Readings from Last 3 Encounters:  04/26/16 102/50  04/07/16 106/60  04/01/16 108/62    Wt Readings from Last 3 Encounters:  04/26/16 205 lb (92.987 kg)  04/07/16 202 lb (91.627 kg)  04/01/16 202 lb (91.627 kg)    Physical Exam  Constitutional: No distress.  HENT:  Head: Normocephalic and atraumatic.  Mouth/Throat: Oropharynx is clear and moist. No oropharyngeal exudate.  Eyes: Conjunctivae are normal. Right eye exhibits no  discharge. Left eye exhibits no discharge. No scleral icterus.  Neck: Normal range of motion. Neck supple. No JVD present. No tracheal deviation present. No thyromegaly present.  Cardiovascular: Normal rate, regular rhythm, normal heart sounds and intact distal pulses.  Exam reveals no gallop and no friction rub.   No murmur heard. Pulmonary/Chest: Effort normal and breath sounds normal. No stridor. No respiratory distress. He has no wheezes. He has no rales. He exhibits no tenderness.  Abdominal: Soft. Bowel sounds are normal. He exhibits no distension. There is no tenderness. There is no rebound and no guarding.  Musculoskeletal: Normal range of motion. He exhibits edema (trace pitting edema over BLE). He exhibits no tenderness.  Lymphadenopathy:    He has no cervical adenopathy.  Skin: Skin is warm and dry. Lesion and rash noted. No bruising, no ecchymosis and no purpura noted. Rash is papular. Rash is not macular, not nodular, not pustular, not vesicular and not urticarial. He is not diaphoretic. No erythema. No pallor.  Over both lower legs, symmetrically, and diffuse there are tiny excoriated lesions with scab.  Vitals reviewed.   Lab Results  Component Value Date   WBC 6.2 04/01/2016   HGB 12.2* 04/01/2016   HCT 36.4* 04/01/2016   PLT 119.0* 04/01/2016   GLUCOSE 98 04/01/2016   CHOL 115 02/19/2016   TRIG 58.0 02/19/2016   HDL 44.60 02/19/2016   LDLDIRECT 51.0 04/07/2015   LDLCALC 59 02/19/2016   ALT 14 03/12/2016   AST 30 03/12/2016   NA 138 04/01/2016   K 4.2 04/01/2016   CL 105 04/01/2016   CREATININE 1.08 04/01/2016   BUN 24* 04/01/2016   CO2 27 04/01/2016   TSH 0.36 04/01/2016   PSA 0.26 06/14/2011   INR 1.39 12/19/2015   HGBA1C 6.0 02/19/2016    Dg Chest 2 View  04/07/2016  CLINICAL DATA:  Pneumonia followup EXAM: CHEST  2 VIEW COMPARISON:  April 01, 2016 FINDINGS: The heart size and mediastinal contours are within normal limits. The previously noted opacity in the  medial right lung base is less prominent compared to prior exam. There is no pulmonary edema or pleural effusion. The visualized skeletal structures are unremarkable. IMPRESSION: The previously noted opacity in the medial right lung base is less prominent compared prior exam. Electronically Signed   By: Abelardo Diesel M.D.   On: 04/07/2016 10:22    Assessment & Plan:   Alec was seen today for rash and copd.  Diagnoses and all orders for this visit:  COPD mixed type [facility])- will continue Stiolto -     Ambulatory referral to Pulmonology  Stasis dermatitis of both legs- will treat with a low potency topical steroid, if this does not control his symptoms then will consider starting Vasculera -     triamcinolone cream (KENALOG) 0.5 %; Apply 1 application topically 3 (three) times daily.  I am having Mr. Zingaro start on triamcinolone cream. I am also having him maintain his aspirin, sildenafil, atorvastatin, thiamine, Cholecalciferol, levothyroxine, losartan, spironolactone, indapamide, risperiDONE, metoprolol succinate, torsemide, albuterol, Tiotropium Bromide-Olodaterol, and zolpidem.  Meds ordered this encounter  Medications  . triamcinolone cream (KENALOG) 0.5 %    Sig: Apply 1 application topically 3 (three) times daily.    Dispense:  60 g    Refill:  2     Follow-up: Return if symptoms worsen or fail to improve.  Scarlette Calico, MD

## 2016-04-26 NOTE — Progress Notes (Signed)
Pre visit review using our clinic review tool, if applicable. No additional management support is needed unless otherwise documented below in the visit note. 

## 2016-05-06 ENCOUNTER — Encounter: Payer: Self-pay | Admitting: Internal Medicine

## 2016-05-06 ENCOUNTER — Ambulatory Visit (INDEPENDENT_AMBULATORY_CARE_PROVIDER_SITE_OTHER): Payer: Commercial Managed Care - PPO | Admitting: Internal Medicine

## 2016-05-06 VITALS — BP 90/56 | HR 83 | Ht 71.0 in | Wt 206.0 lb

## 2016-05-06 DIAGNOSIS — Z720 Tobacco use: Secondary | ICD-10-CM

## 2016-05-06 DIAGNOSIS — J449 Chronic obstructive pulmonary disease, unspecified: Secondary | ICD-10-CM

## 2016-05-06 DIAGNOSIS — R0600 Dyspnea, unspecified: Secondary | ICD-10-CM

## 2016-05-06 DIAGNOSIS — F17210 Nicotine dependence, cigarettes, uncomplicated: Secondary | ICD-10-CM

## 2016-05-06 MED ORDER — FAMOTIDINE 20 MG PO TABS: ORAL_TABLET | ORAL | Status: DC

## 2016-05-06 MED ORDER — PANTOPRAZOLE SODIUM 40 MG PO TBEC: 40.0000 mg | DELAYED_RELEASE_TABLET | Freq: Every day | ORAL | Status: DC

## 2016-05-06 NOTE — Assessment & Plan Note (Signed)
Spirometry today does not appear physiologic > needs trial of stiolto 2 each am once masters technique and return for full pfts

## 2016-05-06 NOTE — Progress Notes (Signed)
Subjective:    Patient ID: Jon Boyd, male    DOB: [birth year],    MRN: [id number]  HPI  9 yowm active smoker sob since GB surgery Feb 2017 referred to pulmonary clinic 05/06/2016 by Dr Wilhemina Bonito   05/06/2016 [facility] office visit/ Wert  stiolto one puff each am x 3 weeks Chief Complaint  Patient presents with  . Pulmonary Consult    Referred by Dr. Scarlette Calico for eval of COPD. Pt c/o SOB since April 2017. He states he gets SOB when he lies down at night and also with exertion such as walking from our parking lot to building today. He also c/o prod cough with green sputum and occ trouble with swallowing. He uses albuterol inhaler 3-4 x per day on average.   sob x 3 months, variable at rest but every time exerts at all, assoc with green mucus production better p last round of abx. Able to sleep fine on 3 pillows/ no am flares  Assoc dyshagia/ no obvious aspiration or choking  No obvious other patterns in day to day or daytime variabilty or assoc   cp or chest tightness, subjective wheeze overt sinus or hb symptoms. No unusual exp hx or h/o childhood pna/ asthma or knowledge of premature birth.  Sleeping ok without nocturnal  or early am exacerbation  of respiratory  c/o's or need for noct saba. Also denies any obvious fluctuation of symptoms with weather or environmental changes or other aggravating or alleviating factors except as outlined above   Current Medications, Allergies, Complete Past Medical History, Past Surgical History, Family History, and Social History were reviewed in [employer] record.           Review of Systems  Constitutional: Negative for fever, chills, activity change, appetite change and unexpected weight change.  HENT: Positive for trouble swallowing. Negative for congestion, dental problem, postnasal drip, rhinorrhea, sneezing, sore throat and voice change.   Eyes: Negative for visual disturbance.  Respiratory: Positive  for cough and shortness of breath. Negative for choking.   Cardiovascular: Negative for chest pain and leg swelling.  Gastrointestinal: Negative for nausea, vomiting and abdominal pain.  Genitourinary: Negative for difficulty urinating.  Musculoskeletal: Negative for arthralgias.  Skin: Negative for rash.  Psychiatric/Behavioral: Negative for behavioral problems and confusion.       Objective:   Physical Exam  amb wm mild hoarseness   Wt Readings from Last 3 Encounters:  05/06/16 206 lb (93.441 kg)  04/26/16 205 lb (92.987 kg)  04/07/16 202 lb (91.627 kg)    Vital signs reviewed  HEENT: nl dentition, turbinates, and oropharynx. Nl external ear canals without cough reflex   NECK :  without JVD/Nodes/TM/ nl carotid upstrokes bilaterally   LUNGS: no acc muscle use,  Nl contour chest which is clear to A and P bilaterally without cough on insp or exp maneuvers   CV:  RRR  no s3 or murmur or increase in P2, no edema   ABD:  soft and nontender with nl inspiratory excursion in the supine position. No bruits or organomegaly, bowel sounds nl  MS:  Nl gait/ ext warm without deformities, calf tenderness, cyanosis or clubbing No obvious joint restrictions   SKIN: warm and dry without lesions    NEURO:  alert, approp, nl sensorium with  no motor deficits     I personally reviewed images and agree with radiology impression as follows:  CXR:  04/07/16 previously noted opacity in the  medial right lung base is less prominent compared prior exam.   Labs ordered/ reviewed:      Chemistry      Component Value Date/Time   NA 138 04/01/2016 1448   NA 139 04/30/2011   K 4.2 04/01/2016 1448   CL 105 04/01/2016 1448   CO2 27 04/01/2016 1448   BUN 24* 04/01/2016 1448   BUN 11 04/30/2011   CREATININE 1.08 04/01/2016 1448   CREATININE 0.8 04/30/2011   GLU 129 04/30/2011      Component Value Date/Time   CALCIUM 10.3 04/01/2016 1448   ALKPHOS 139* 03/12/2016 1342   AST 30  03/12/2016 1342   ALT 14 03/12/2016 1342   BILITOT 1.8* 03/12/2016 1342        Lab Results  Component Value Date   WBC 6.2 04/01/2016   HGB 12.2* 04/01/2016   HCT 36.4* 04/01/2016   MCV 92.5 04/01/2016   PLT 119.0* 04/01/2016     Lab Results  Component Value Date   TSH 0.36 04/01/2016     Lab Results  Component Value Date   PROBNP 167.50* 03/08/2016             Assessment & Plan:

## 2016-05-06 NOTE — Assessment & Plan Note (Addendum)
05/06/2016  Walked RA x 3 laps @ 185 ft each stopped due to sob end of lap 3, no desats  Or cp  - Spirometry 05/06/2016  Severe truncation exp loop s audible wheeze/pseudowheeze  Symptoms are markedly disproportionate to objective findings and not clear this is a lung problem but pt does appear to have difficult airway management issues. DDX of  difficult airways management almost all start with A and  include Adherence, Ace Inhibitors, Acid Reflux, Active Sinus Disease, Alpha 1 Antitripsin deficiency, Anxiety masquerading as Airways dz,  ABPA,  Allergy(esp in young), Aspiration (esp in elderly), Adverse effects of meds,  Active smokers, A bunch of PE's (a small clot burden can't cause this syndrome unless there is already severe underlying pulm or vascular dz with poor reserve) plus two Bs  = Bronchiectasis and Beta blocker use..and one C= CHF  Adherence is always the initial "prime suspect" and is a multilayered concern that requires a "trust but verify" approach in every patient - starting with knowing how to use medications, especially inhalers, correctly, keeping up with refills and understanding the fundamental difference between maintenance and prns vs those medications only taken for a very short course and then stopped and not refilled.  - The proper method of use, as well as anticipated side effects, of a metered-dose inhaler are discussed and demonstrated to the patient. Improved effectiveness after extensive coaching during this visit to a level of approximately 75 % from a baseline of 25% % > continue stiolto but doubt this is copd (see separate a/p)   ? Acid (or non-acid) GERD > always difficult to exclude as up to 75% of pts in some series report no assoc GI/ Heartburn symptoms> rec max (24h)  acid suppression and diet restrictions/ reviewed and instructions given in writing.   Active smoking (see separate a/p)   ? Anxiety/ depression  > usually dx of exclusion but much higher here given  inability to reproduce his symptoms that are reportedly present at rest but min with exertion here  ? BB > very unlikely on such a low dose of lopressor  ? chf > doubt with bnp so low  The assoc with ET suggests an upper airway issue and is supported by flat fv curve but I don't hear anything at all in the upper airway either on max FVC or IVC to suggest a mechanical problem related to ET - we'll see how rx of GERD affects his symptoms and f/v curve on return in 2 weeks  Total time devoted to counseling  = 35/75m review case with pt/ discussion of options/alternatives/ personally creating written instructions  in presence of pt  then going over those specific  Instructions directly with the pt including how to use all of the meds but in particular covering each new medication in detail and the difference between the maintenance/automatic meds and the prns using an action plan format for the latter.

## 2016-05-06 NOTE — Patient Instructions (Addendum)
Continue stiolto 2 puffs each am   Only use your albuterol as a rescue medication to be used if you can't catch your breath by resting or doing a relaxed purse lip breathing pattern.  - The less you use it, the better it will work when you need it. - Ok to use up to 2 puffs  every 4 hours if you must but call for immediate appointment if use goes up over your usual need - Don't leave home without it !!  (think of it like the spare tire for your car)   Pantoprazole (protonix) 40 mg   Take  30-60 min before first meal of the day and Pepcid (famotidine)  20 mg one @  bedtime until return to office - this is the best way to tell whether stomach acid is contributing to your problem.    GERD (REFLUX)  is an extremely common cause of respiratory symptoms just like yours , many times with no obvious heartburn at all.    It can be treated with medication, but also with lifestyle changes including elevation of the head of your bed (ideally with 6 inch  bed blocks),  Smoking cessation, avoidance of late meals, excessive alcohol, and avoid fatty foods, chocolate, peppermint, colas, red wine, and acidic juices such as orange juice.  NO MINT OR MENTHOL PRODUCTS SO NO COUGH DROPS  USE SUGARLESS CANDY INSTEAD (Jolley ranchers or Stover's or Life Savers) or even ice chips will also do - the key is to swallow to prevent all throat clearing. NO OIL BASED VITAMINS - use powdered substitutes.  Please schedule a follow up office visit in 2  weeks, sooner if needed with full pfts at [facility] on return    .

## 2016-05-06 NOTE — Assessment & Plan Note (Signed)

## 2016-05-10 ENCOUNTER — Ambulatory Visit (HOSPITAL_COMMUNITY)
Admission: RE | Admit: 2016-05-10 | Discharge: 2016-05-10 | Disposition: A | Discharge status: Home / Self Care | Payer: Commercial Managed Care - PPO | Source: Ambulatory Visit | Attending: Internal Medicine | Admitting: Internal Medicine

## 2016-05-10 DIAGNOSIS — R0600 Dyspnea, unspecified: Secondary | ICD-10-CM | POA: Diagnosis present

## 2016-05-10 LAB — PULMONARY FUNCTION TEST
DL/VA % pred: 65 %
DL/VA: 3.05 ml/min/mmHg/L
DLCO unc % pred: 55 %
DLCO unc: 18.64 ml/min/mmHg
FEF 25-75 Post: 2.9 L/sec
FEF 25-75 Pre: 2.21 L/sec
FEF2575-%CHANGE-POST: 31 %
FEF2575-%Pred-Post: 87 %
FEF2575-%Pred-Pre: 66 %
FEV1-%Change-Post: 9 %
FEV1-%PRED-PRE: 76 %
FEV1-%Pred-Post: 83 %
FEV1-PRE: 2.96 L
FEV1-Post: 3.25 L
FEV1FVC-%CHANGE-POST: -1 %
FEV1FVC-%Pred-Pre: 92 %
FEV6-%Change-Post: 8 %
FEV6-%PRED-PRE: 84 %
FEV6-%Pred-Post: 92 %
FEV6-POST: 4.49 L
FEV6-PRE: 4.14 L
FEV6FVC-%Change-Post: -2 %
FEV6FVC-%PRED-POST: 100 %
FEV6FVC-%PRED-PRE: 103 %
FVC-%CHANGE-POST: 11 %
FVC-%Pred-Post: 91 %
FVC-%Pred-Pre: 82 %
FVC-POST: 4.64 L
FVC-Pre: 4.17 L
POST FEV6/FVC RATIO: 97 %
PRE FEV1/FVC RATIO: 71 %
PRE FEV6/FVC RATIO: 99 %
Post FEV1/FVC ratio: 70 %
RV % PRED: 131 %
RV: 2.9 L
TLC % PRED: 102 %
TLC: 7.34 L

## 2016-05-10 MED ORDER — ALBUTEROL SULFATE (2.5 MG/3ML) 0.083% IN NEBU
2.5000 mg | INHALATION_SOLUTION | Freq: Once | RESPIRATORY_TRACT | Status: AC
  Administered 2016-05-10: 2.5 mg via RESPIRATORY_TRACT

## 2016-05-16 IMAGING — US US ABDOMEN COMPLETE
1 series · 13 of 25 positions shown · non-contrast
Comparison: 02/07/2014

CLINICAL DATA: Mid abdominal pain since yesterday.

EXAM:
ABDOMEN ULTRASOUND COMPLETE

[Series 1: us abdomen complete · 0.23mm/px · 13 of 189 slices shown]
[im 1/189]
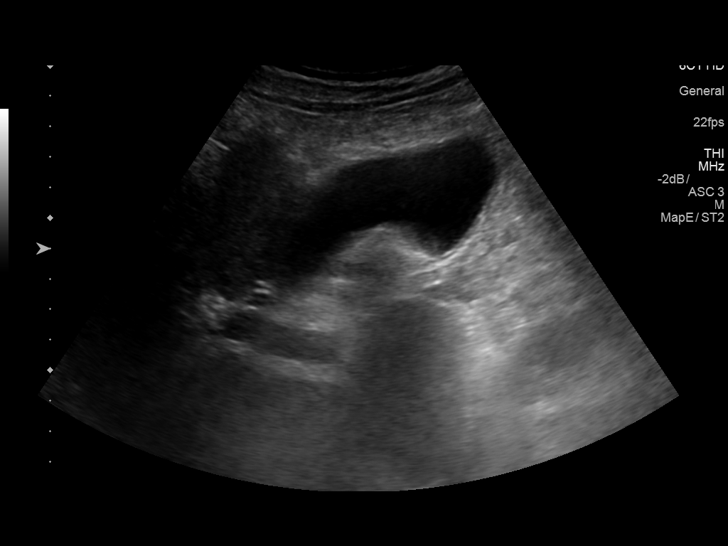
[im 16/189]
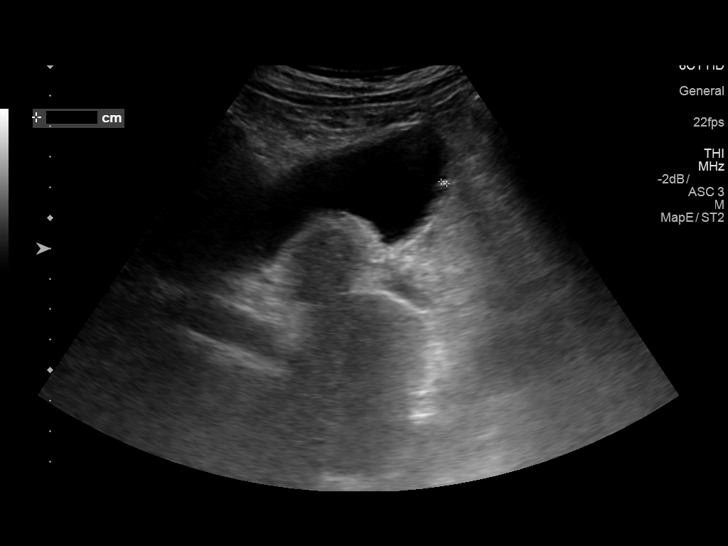
[im 32/189]
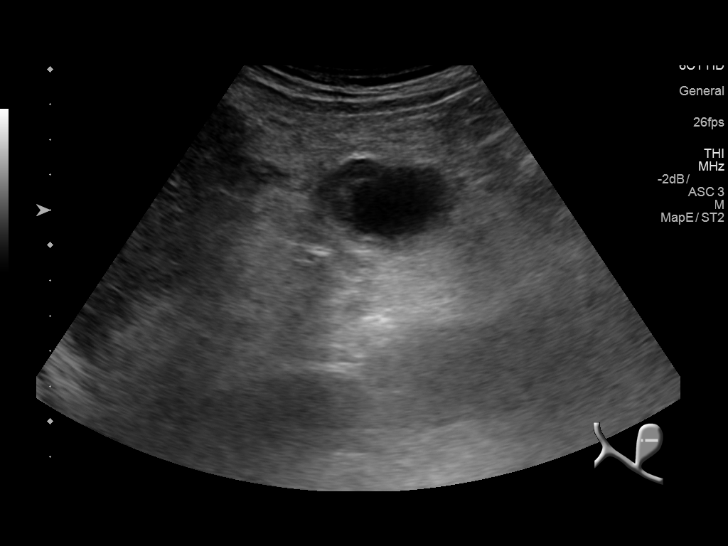
[im 48/189]
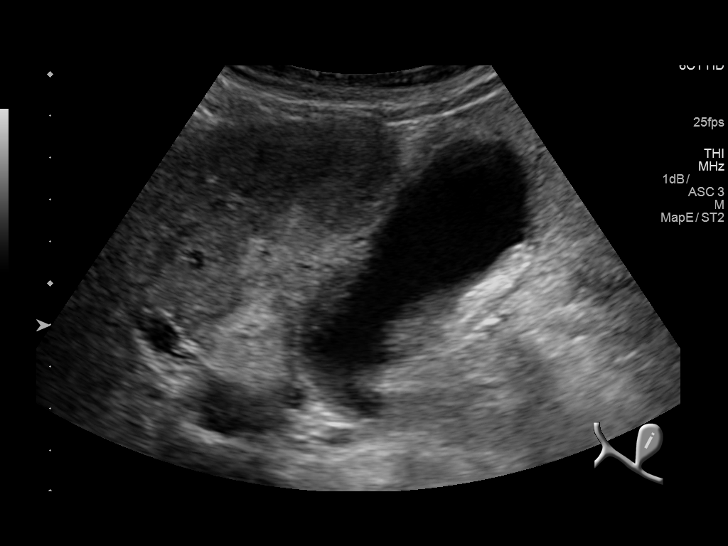
[im 63/189]
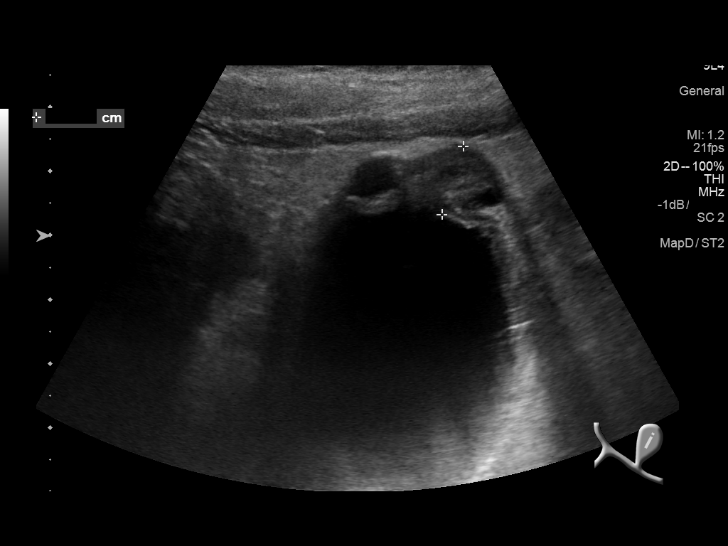
[im 79/189]
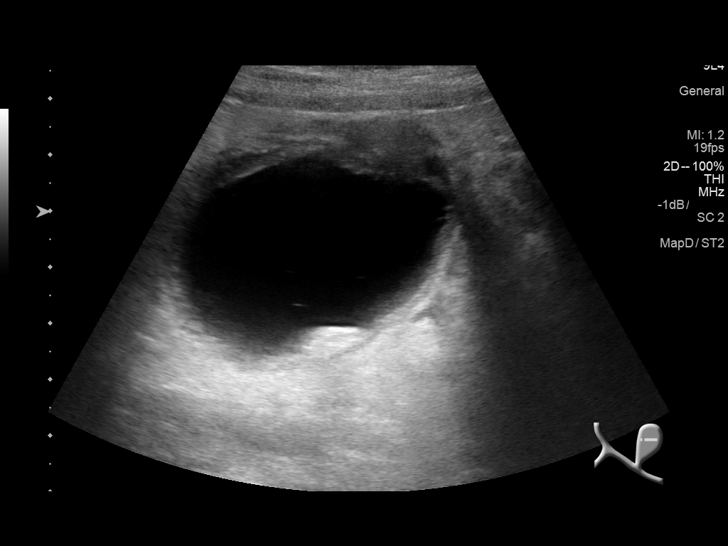
[im 95/189]
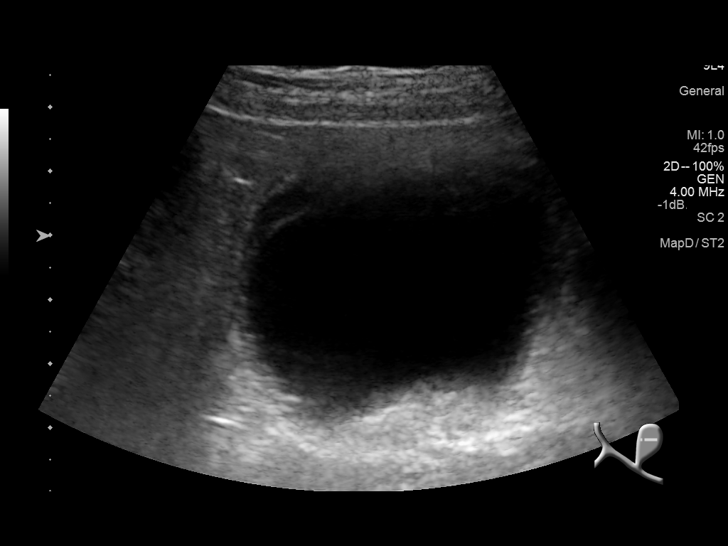
[im 110/189]
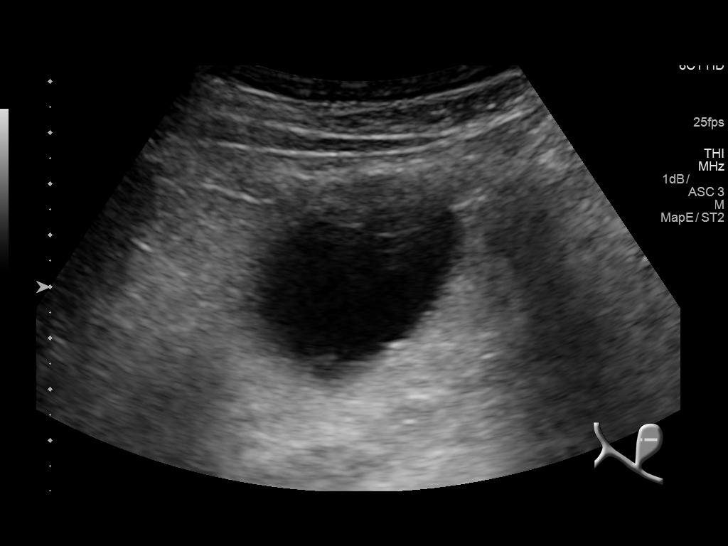
[im 126/189]
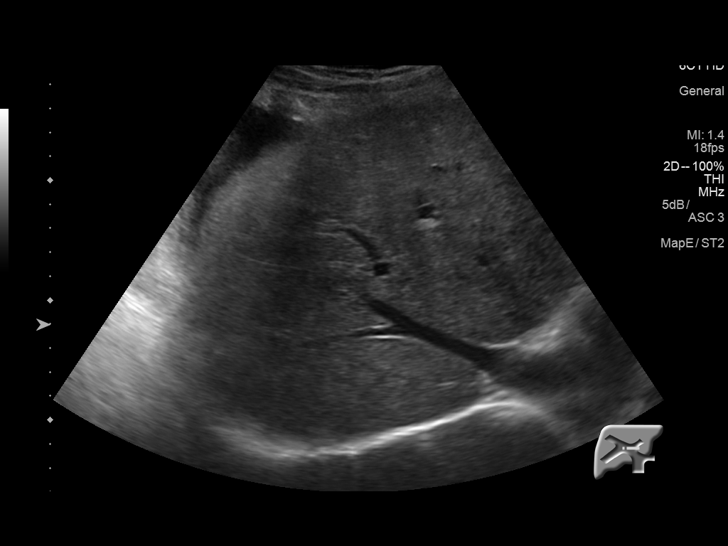
[im 142/189]
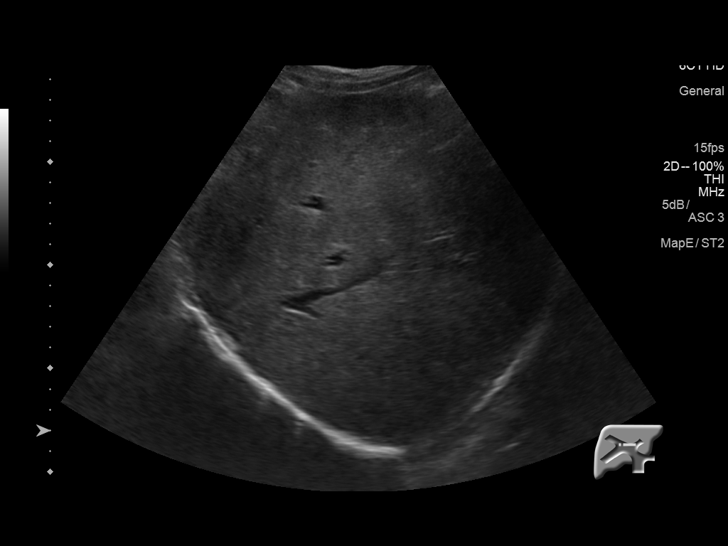
[im 157/189]
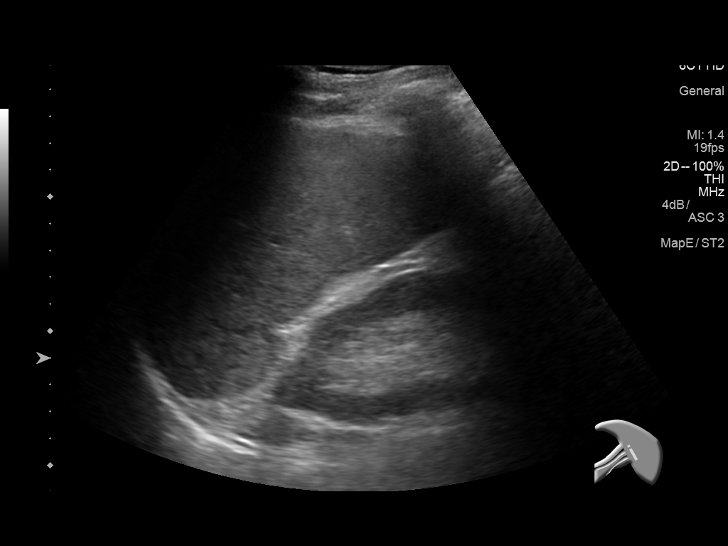
[im 173/189]
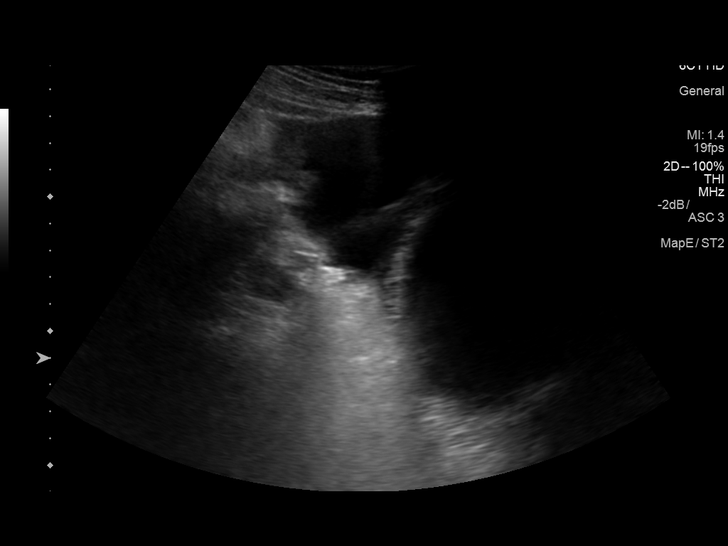
[im 189/189]
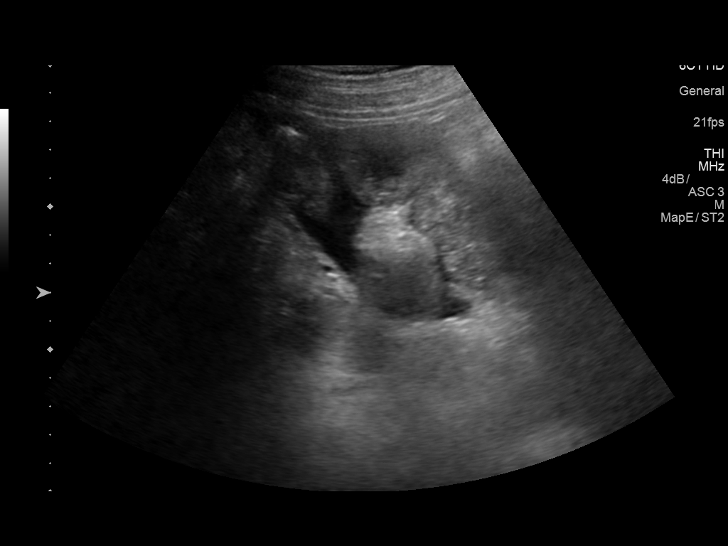

[13 of 25 positions shown; findings below may reference images not displayed]

FINDINGS: Gallbladder: Moderate distention of the gallbladder with asymmetric
wall thickening. Gallbladder wall measures up to 1.1 cm 2 at the
fundus. There are cystic type structures along the gallbladder wall
and/or pericholecystic fluid. Echogenic material in the gallbladder
could represent stones or sludge. Reportedly, the patient has a
sonographic Murphy's sign.

Common bile duct: Diameter: 0.6 cm.

Liver: Liver is mildly heterogeneous without a focal lesion. Small
amount of perihepatic fluid. Main portal vein is patent with
hepatopetal flow.

IVC: No abnormality visualized.

Pancreas: Visualized portion unremarkable.

Spleen: Size and appearance within normal limits.

Right Kidney: Length: 13.0 cm. Echogenicity within normal limits. No
mass or hydronephrosis visualized.

Left Kidney: Length: 13.8. Left kidney is poorly visualized due to
bowel gas. No evidence for hydronephrosis.

Abdominal aorta: No aneurysm visualized.

Other findings: Small amount of ascites in the right lower quadrant
and left lower quadrant.
IMPRESSION: Gallbladder is markedly abnormal. The gallbladder is distended with
asymmetric wall thickening. Fluid or cystic-type structures around
the gallbladder wall. Probable sludge or gallstones. In addition,
there is small amount of ascites in the abdomen. Findings are
concerning for acute cholecystitis. Recommend further evaluation
with a CT of the abdomen/pelvis to better characterize the
gallbladder disease and exclude a neoplastic process.

Abdominal ascites could be secondary to gallbladder disease. The
liver is mildly heterogeneous and difficult to exclude early
cirrhotic changes. These findings could also be further
characterized with CT.

These results were called by telephone at the time of interpretation
on 10/29/2015 at [DATE] to Dr. ARIEL VWRBV HERMENEGILDO , who verbally
acknowledged these results.

## 2016-05-21 ENCOUNTER — Telehealth: Payer: Self-pay | Admitting: *Deleted

## 2016-05-21 MED ORDER — ZOLPIDEM TARTRATE 5 MG PO TABS: 5.0000 mg | ORAL_TABLET | Freq: Every evening | ORAL | Status: DC | PRN

## 2016-05-21 NOTE — Telephone Encounter (Signed)
done

## 2016-05-21 NOTE — Telephone Encounter (Signed)
Rec'd fax pt requesting refill on his zolpidem 5 mg. Last filled 04/19/16...Jon Boyd

## 2016-05-21 NOTE — Telephone Encounter (Signed)
Faxed script back to [facility].../lmb 

## 2016-05-24 NOTE — Progress Notes (Signed)
Spoke with pt and notified of results per Dr. Wert. Pt verbalized understanding and denied any questions. 

## 2016-05-25 ENCOUNTER — Ambulatory Visit (INDEPENDENT_AMBULATORY_CARE_PROVIDER_SITE_OTHER)
Admission: RE | Admit: 2016-05-25 | Discharge: 2016-05-25 | Disposition: A | Discharge status: Home / Self Care | Payer: Commercial Managed Care - PPO | Source: Ambulatory Visit | Attending: Internal Medicine | Admitting: Internal Medicine

## 2016-05-25 ENCOUNTER — Ambulatory Visit (INDEPENDENT_AMBULATORY_CARE_PROVIDER_SITE_OTHER): Payer: Commercial Managed Care - PPO | Admitting: Internal Medicine

## 2016-05-25 ENCOUNTER — Encounter: Payer: Self-pay | Admitting: *Deleted

## 2016-05-25 ENCOUNTER — Encounter: Payer: Self-pay | Admitting: Internal Medicine

## 2016-05-25 VITALS — BP 104/60 | HR 71 | Ht 71.0 in | Wt 201.0 lb

## 2016-05-25 DIAGNOSIS — Z720 Tobacco use: Secondary | ICD-10-CM | POA: Diagnosis not present

## 2016-05-25 DIAGNOSIS — R9389 Abnormal findings on diagnostic imaging of other specified body structures: Secondary | ICD-10-CM

## 2016-05-25 DIAGNOSIS — R938 Abnormal findings on diagnostic imaging of other specified body structures: Secondary | ICD-10-CM

## 2016-05-25 DIAGNOSIS — J449 Chronic obstructive pulmonary disease, unspecified: Secondary | ICD-10-CM

## 2016-05-25 DIAGNOSIS — F17210 Nicotine dependence, cigarettes, uncomplicated: Secondary | ICD-10-CM

## 2016-05-25 NOTE — Assessment & Plan Note (Addendum)
PFT's  05/10/16  FEV1 3.25 (83 % ) ratio 70  p 9 % improvement from saba p nothing prior to study with DLCO  55 % corrects to 65 % for alv volume   > try off stiolto 05/25/2016   - The proper method of use, as well as anticipated side effects, of a metered-dose inhaler are discussed and demonstrated to the patient. Improved effectiveness after extensive coaching during this visit to a level of approximately 75 % from a baseline of 59 %     I had an extended final summary discussion with the patient reviewing all relevant studies completed to date and  lasting 15 to 20 minutes of a 25 minute visit on the following issues:    He really does not have sign copd at this point but probably better characterized as a GOLD 0 / AB  Phenotype with group B symptoms/ risk and may not really need stiolto at all if he stops smoking now - see sep a/p.  Each maintenance medication was reviewed in detail including most importantly the difference between maintenance and as needed and under what circumstances the prns are to be used.  Please see instructions for details which were reviewed in writing and the patient given a copy.

## 2016-05-25 NOTE — Patient Instructions (Signed)
Try off stiolto and you should do fine but you need to stop smoking completely before smoking completely stops you!   Only use your albuterol as a rescue medication to be used if you can't catch your breath by resting or doing a relaxed purse lip breathing pattern.  - The less you use it, the better it will work when you need it. - Ok to use up to 2 puffs  every 4 hours if you must but call for immediate appointment if use goes up over your usual need - Don't leave home without it !!  (think of it like the spare tire for your car)   Please remember to go to the  x-ray department downstairs for your tests - we will call you with the results when they are available.   If you are satisfied with your treatment plan,  let your doctor know and he/she can either refill your medications or you can return here when your prescription runs out.     If in any way you are not 100% satisfied,  please tell us.  If 100% better, tell your friends!  Pulmonary follow up is as needed

## 2016-05-25 NOTE — Progress Notes (Addendum)
Subjective:    Patient ID: Jon Boyd, male    DOB: [birth year],    MRN: [id number]  HPI  4 yowm active smoker sob since GB surgery Feb 2017 referred to pulmonary clinic 05/06/2016 by Dr Wilhemina Bonito   05/06/2016 1st [facility] office visit/ Rosemary Mossbarger  stiolto one puff each am x 3 weeks Chief Complaint  Patient presents with  . Pulmonary Consult    Referred by Dr. Scarlette Calico for eval of COPD. Pt c/o SOB since April 2017. He states he gets SOB when he lies down at night and also with exertion such as walking from our parking lot to building today. He also c/o prod cough with green sputum and occ trouble with swallowing. He uses albuterol inhaler 3-4 x per day on average.   sob x 3 months, variable at rest but every time exerts at all, assoc with green mucus production better p last round of abx. Able to sleep fine on 3 pillows/ no am flares  Assoc dyshagia/ no obvious aspiration or choking rec Continue stiolto 2 puffs each am  Only use your albuterol as a rescue medication Pantoprazole (protonix) 40 mg   Take  30-60 min before first meal of the day and Pepcid (famotidine)  20 mg one @  bedtime until return to office - this is the best way to tell whether stomach acid is contributing to your problem.   GERD diet   05/25/2016  f/u ov/Kelsie Zaborowski re: GOLD 0 copd / stiolto 2 pffs daily / no rescue still smoking Chief Complaint  Patient presents with  . Follow-up    Breathing has improved some. He was less SOB walking from parking lot to our building today. Sputum is now white, but still coughing just as much. He is using albuterol 2-3 x per wk on average.   sleep is disrupted by shoulders L >> R  Shoulder pain/ ,mild am cough x a few min but nothing really purulent. Doe = MMRC2 = can't walk a nl pace on a flat grade s sob but does fine slow and flat eg [employer] shopping     No obvious day to day or daytime variability or assoc  mucus plugs or hemoptysis or cp or chest tightness, subjective wheeze  or overt sinus or hb symptoms. No unusual exp hx or h/o childhood pna/ asthma or knowledge of premature birth.  Sleeping ok without nocturnal  or early am exacerbation  of respiratory  c/o's or need for noct saba. Also denies any obvious fluctuation of symptoms with weather or environmental changes or other aggravating or alleviating factors except as outlined above   Current Medications, Allergies, Complete Past Medical History, Past Surgical History, Family History, and Social History were reviewed in [employer] record.  ROS  The following are not active complaints unless bolded sore throat, dysphagia, dental problems, itching, sneezing,  nasal congestion or excess/ purulent secretions, ear ache,   fever, chills, sweats, unintended wt loss, classically pleuritic or exertional cp,  orthopnea pnd or leg swelling, presyncope, palpitations, abdominal pain, anorexia, nausea, vomiting, diarrhea  or change in bowel or bladder habits, change in stools or urine, dysuria,hematuria,  rash, arthralgias, visual complaints, headache, numbness, weakness or ataxia or problems with walking or coordination,  change in mood/affect or memory.                   Objective:   Physical Exam  amb elderly wm >>> stated age  05/25/2016       201  05/06/16 206 lb (93.441 kg)  04/26/16 205 lb (92.987 kg)  04/07/16 202 lb (91.627 kg)    Vital signs reviewed - note 02 is 98% RA on arrival  HEENT: nl dentition, turbinates, and oropharynx. Nl external ear canals without cough reflex   NECK :  without JVD/Nodes/TM/ nl carotid upstrokes bilaterally   LUNGS: no acc muscle use,  Nl contour chest which is clear to A and P bilaterally without cough on insp or exp maneuvers   CV:  RRR  no s3 or murmur or increase in P2, no edema   ABD:  soft and nontender with nl inspiratory excursion in the supine position. No bruits or organomegaly, bowel sounds nl  MS:  Nl gait/ ext warm without  deformities, calf tenderness, cyanosis or clubbing No obvious joint restrictions   SKIN: warm and dry without lesions    NEURO:  alert, approp, nl sensorium with  no motor deficits      Labs   reviewed:      Chemistry      Component Value Date/Time   NA 138 04/01/2016 1448   NA 139 04/30/2011   K 4.2 04/01/2016 1448   CL 105 04/01/2016 1448   CO2 27 04/01/2016 1448   BUN 24* 04/01/2016 1448   BUN 11 04/30/2011   CREATININE 1.08 04/01/2016 1448   CREATININE 0.8 04/30/2011   GLU 129 04/30/2011      Component Value Date/Time   CALCIUM 10.3 04/01/2016 1448   ALKPHOS 139* 03/12/2016 1342   AST 30 03/12/2016 1342   ALT 14 03/12/2016 1342   BILITOT 1.8* 03/12/2016 1342        Lab Results  Component Value Date   WBC 6.2 04/01/2016   HGB 12.2* 04/01/2016   HCT 36.4* 04/01/2016   MCV 92.5 04/01/2016   PLT 119.0* 04/01/2016     Lab Results  Component Value Date   TSH 0.36 04/01/2016     Lab Results  Component Value Date   PROBNP 167.50* 03/08/2016      CXR PA and Lateral:   05/25/2016 :    I personally reviewed images and agree with radiology impression as follows:   The lungs are mildly hyperinflated. There is no focal infiltrate. Previous demonstrated increased density overlying the lower right heart border is no longer evident. There is no pleural effusion. The heart and pulmonary vascularity are normal. The mediastinum is normal in width. There is calcification in the wall of the aortic arch.      Assessment & Plan:

## 2016-05-26 DIAGNOSIS — R9389 Abnormal findings on diagnostic imaging of other specified body structures: Secondary | ICD-10-CM | POA: Insufficient documentation

## 2016-05-26 NOTE — Assessment & Plan Note (Signed)

## 2016-05-26 NOTE — Assessment & Plan Note (Addendum)
?   Lung nodule 03/08/16 > cleared 05/25/2016  > no f/u needed

## 2016-05-27 ENCOUNTER — Telehealth: Payer: Self-pay | Admitting: Internal Medicine

## 2016-05-27 NOTE — Progress Notes (Signed)
LMTCB

## 2016-05-27 NOTE — Telephone Encounter (Signed)
Magda Paganini gave me a clarification form that was sent to Dr. Melvyn Novas from the patient's employer [employer]. It is similar to disability forms. Sent form to [employer] to log. -05/27/16-prm

## 2016-06-17 ENCOUNTER — Other Ambulatory Visit (INDEPENDENT_AMBULATORY_CARE_PROVIDER_SITE_OTHER): Payer: Self-pay

## 2016-06-17 ENCOUNTER — Encounter: Payer: Self-pay | Admitting: Internal Medicine

## 2016-06-17 ENCOUNTER — Ambulatory Visit (INDEPENDENT_AMBULATORY_CARE_PROVIDER_SITE_OTHER): Payer: Commercial Managed Care - PPO | Admitting: Internal Medicine

## 2016-06-17 ENCOUNTER — Telehealth: Payer: Self-pay

## 2016-06-17 VITALS — BP 100/50 | HR 74 | Temp 98.8°F | Resp 16 | Ht 71.0 in | Wt 209.0 lb

## 2016-06-17 DIAGNOSIS — D510 Vitamin B12 deficiency anemia due to intrinsic factor deficiency: Secondary | ICD-10-CM

## 2016-06-17 DIAGNOSIS — D509 Iron deficiency anemia, unspecified: Secondary | ICD-10-CM

## 2016-06-17 DIAGNOSIS — I10 Essential (primary) hypertension: Secondary | ICD-10-CM

## 2016-06-17 LAB — IBC PANEL
Iron: 94 ug/dL (ref 42–165)
SATURATION RATIOS: 26.3 % (ref 20.0–50.0)
TRANSFERRIN: 255 mg/dL (ref 212.0–360.0)

## 2016-06-17 LAB — CBC WITH DIFFERENTIAL/PLATELET
BASOS ABS: 0 10*3/uL (ref 0.0–0.1)
Basophils Relative: 0.5 % (ref 0.0–3.0)
EOS ABS: 0.2 10*3/uL (ref 0.0–0.7)
Eosinophils Relative: 3.5 % (ref 0.0–5.0)
HCT: 36 % — ABNORMAL LOW (ref 39.0–52.0)
Hemoglobin: 12.1 g/dL — ABNORMAL LOW (ref 13.0–17.0)
LYMPHS ABS: 1.9 10*3/uL (ref 0.7–4.0)
LYMPHS PCT: 39.8 % (ref 12.0–46.0)
MCHC: 33.7 g/dL (ref 30.0–36.0)
MCV: 94.3 fl (ref 78.0–100.0)
MONOS PCT: 11.4 % (ref 3.0–12.0)
Monocytes Absolute: 0.5 10*3/uL (ref 0.1–1.0)
NEUTROS ABS: 2.1 10*3/uL (ref 1.4–7.7)
NEUTROS PCT: 44.8 % (ref 43.0–77.0)
PLATELETS: 83 10*3/uL — AB (ref 150.0–400.0)
RBC: 3.82 Mil/uL — ABNORMAL LOW (ref 4.22–5.81)
RDW: 18 % — ABNORMAL HIGH (ref 11.5–15.5)
WBC: 4.8 10*3/uL (ref 4.0–10.5)

## 2016-06-17 LAB — BASIC METABOLIC PANEL
BUN: 17 mg/dL (ref 6–23)
CALCIUM: 10 mg/dL (ref 8.4–10.5)
CO2: 25 meq/L (ref 19–32)
Chloride: 108 mEq/L (ref 96–112)
Creatinine, Ser: 0.84 mg/dL (ref 0.40–1.50)
GFR: 100.72 mL/min (ref >60.00)
Glucose, Bld: 215 mg/dL — ABNORMAL HIGH (ref 70–99)
POTASSIUM: 3.6 meq/L (ref 3.5–5.1)
SODIUM: 137 meq/L (ref 135–145)

## 2016-06-17 LAB — VITAMIN B12: Vitamin B-12: 355 pg/mL (ref 211–911)

## 2016-06-17 LAB — FOLATE: Folate: >23.7 ng/mL (ref >5.9)

## 2016-06-17 LAB — FERRITIN: Ferritin: 43.9 ng/mL (ref 22.0–322.0)

## 2016-06-17 NOTE — Progress Notes (Signed)
Subjective:  Patient ID: Jon Boyd, male    DOB: 23-Oct-1961  Age: 55 y.o. MRN: [id number]  CC: Hypertension; Hypothyroidism; and Anemia   HPI JAHAZIAH CHEESEMAN presents for f/up - He feels well and offers no complaints. He wants to return to work. He needs a follow-up on anemia. He continues to abstain from alcohol intake but has not quit smoking. He has not had many respiratory symptoms lately and is therefore not been using STIOLTO very frequently.  Outpatient Medications Prior to Visit  Medication Sig Dispense Refill  . albuterol (PROVENTIL HFA;VENTOLIN HFA) 108 (90 Base) MCG/ACT inhaler Inhale 2 puffs into the lungs every 6 (six) hours as needed for wheezing or shortness of breath. 1 Inhaler 11  . aspirin 81 MG chewable tablet Chew 81 mg by mouth daily.      Marland Kitchen atorvastatin (LIPITOR) 20 MG tablet Take 1 tablet (20 mg total) by mouth daily at 6 PM. 90 tablet 3  . Cholecalciferol 50000 units TABS Take 1 tablet by mouth once a week. 12 tablet 3  . famotidine (PEPCID) 20 MG tablet One at bedtime 30 tablet 2  . indapamide (LOZOL) 1.25 MG tablet Take 1 tablet (1.25 mg total) by mouth daily. 30 tablet 3  . levothyroxine (SYNTHROID) 175 MCG tablet Take 1 tablet (175 mcg total) by mouth daily before breakfast. 90 tablet 1  . losartan (COZAAR) 50 MG tablet TAKE 1 TABLET (50 MG TOTAL) BY MOUTH DAILY.  3  . metoprolol succinate (TOPROL-XL) 25 MG 24 hr tablet Take 1 tablet (25 mg total) by mouth daily. 90 tablet 3  . pantoprazole (PROTONIX) 40 MG tablet Take 1 tablet (40 mg total) by mouth daily. Take 30-60 min before first meal of the day 30 tablet 2  . risperiDONE (RISPERDAL) 1 MG tablet Take 1 tablet (1 mg total) by mouth at bedtime. 30 tablet 1  . sildenafil (VIAGRA) 100 MG tablet Take 0.5-1 tablets (50-100 mg total) by mouth daily as needed for erectile dysfunction. 10 tablet 3  . spironolactone (ALDACTONE) 100 MG tablet Take 1 tablet (100 mg total) by mouth daily. 30 tablet 3  . thiamine (VITAMIN  B-1) 100 MG tablet Take 100 mg by mouth daily.    Marland Kitchen triamcinolone cream (KENALOG) 0.5 % Apply 1 application topically 3 (three) times daily. 60 g 2  . zolpidem (AMBIEN) 5 MG tablet Take 1 tablet (5 mg total) by mouth at bedtime as needed. for sleep 30 tablet 3  . torsemide (DEMADEX) 20 MG tablet Take 1 tablet (20 mg total) by mouth daily. 8 tablet 0   No facility-administered medications prior to visit.     ROS Review of Systems  Constitutional: Negative.  Negative for activity change, chills, diaphoresis, fatigue and fever.  HENT: Negative.  Negative for sinus pressure, sore throat and trouble swallowing.   Eyes: Negative.  Negative for visual disturbance.  Respiratory: Positive for shortness of breath and wheezing. Negative for cough, choking, chest tightness and stridor.   Cardiovascular: Negative.  Negative for chest pain and palpitations.  Gastrointestinal: Negative.  Negative for abdominal pain, blood in stool, constipation, diarrhea, nausea and vomiting.  Endocrine: Negative.   Genitourinary: Negative.  Negative for difficulty urinating and dysuria.  Musculoskeletal: Negative.  Negative for arthralgias, back pain, myalgias and neck pain.  Skin: Negative.  Negative for color change and rash.  Allergic/Immunologic: Negative.   Neurological: Negative.  Negative for dizziness, weakness, light-headedness, numbness and headaches.  Hematological: Negative.  Negative for adenopathy.  Does not bruise/bleed easily.  Psychiatric/Behavioral: Positive for sleep disturbance. Negative for dysphoric mood, self-injury and suicidal ideas. The patient is not nervous/anxious.     Objective:  BP (!) 100/50 (BP Location: Left Arm, Patient Position: Sitting, Cuff Size: Normal)   Pulse 74   Temp 98.8 F (37.1 C) (Oral)   Resp 16   Ht 5\' 11"  (1.803 m)   Wt 209 lb (94.8 kg)   SpO2 97%   BMI 29.15 kg/m   BP Readings from Last 3 Encounters:  06/17/16 (!) 100/50  05/25/16 104/60  05/06/16 (!) 90/56     Wt Readings from Last 3 Encounters:  06/17/16 209 lb (94.8 kg)  05/25/16 201 lb (91.2 kg)  05/06/16 206 lb (93.4 kg)    Physical Exam  Constitutional: No distress.  HENT:  Mouth/Throat: Oropharynx is clear and moist. No oropharyngeal exudate.  Eyes: Conjunctivae are normal. Right eye exhibits no discharge. Left eye exhibits no discharge. No scleral icterus.  Neck: Normal range of motion. Neck supple. No JVD present. No tracheal deviation present. No thyromegaly present.  Cardiovascular: Normal rate, regular rhythm, normal heart sounds and intact distal pulses.  Exam reveals no gallop and no friction rub.   No murmur heard. Pulmonary/Chest: Effort normal. No accessory muscle usage or stridor. No respiratory distress. He has no decreased breath sounds. He has no wheezes. He has rhonchi in the right lower field and the left lower field. He has no rales. He exhibits no tenderness.  Abdominal: Soft. Bowel sounds are normal. He exhibits no distension and no mass. There is no tenderness. There is no rebound and no guarding.  Musculoskeletal: Normal range of motion. He exhibits no edema, tenderness or deformity.  Lymphadenopathy:    He has no cervical adenopathy.  Skin: Skin is warm and dry. No rash noted. He is not diaphoretic. No erythema. No pallor.  Vitals reviewed.   Lab Results  Component Value Date   WBC 4.8 06/17/2016   HGB 12.1 (L) 06/17/2016   HCT 36.0 (L) 06/17/2016   PLT 83.0 (L) 06/17/2016   GLUCOSE 215 (H) 06/17/2016   CHOL 115 02/19/2016   TRIG 58.0 02/19/2016   HDL 44.60 02/19/2016   LDLDIRECT 51.0 04/07/2015   LDLCALC 59 02/19/2016   ALT 14 03/12/2016   AST 30 03/12/2016   NA 137 06/17/2016   K 3.6 06/17/2016   CL 108 06/17/2016   CREATININE 0.84 06/17/2016   BUN 17 06/17/2016   CO2 25 06/17/2016   TSH 0.36 04/01/2016   PSA 0.26 06/14/2011   INR 1.39 12/19/2015   HGBA1C 6.0 02/19/2016    Dg Chest 2 View  Result Date: 05/26/2016 CLINICAL DATA:   Follow-up of COPD, 10 days of left arm pain, otherwise no complaints. Current smoker, history of hyperlipidemia, cirrhosis, hypertension, and previous CVA EXAM: CHEST  2 VIEW COMPARISON:  PA and lateral chest x-ray of April 07, 2016 FINDINGS: The lungs are mildly hyperinflated. There is no focal infiltrate. Previous demonstrated increased density overlying the lower right heart border is no longer evident. There is no pleural effusion. The heart and pulmonary vascularity are normal. The mediastinum is normal in width. There is calcification in the wall of the aortic arch. IMPRESSION: 1. COPD.  No pneumonia nor CHF. 2. Aortic atherosclerosis. Electronically Signed   By: David  Martinique M.D.   On: 05/26/2016 08:07   Assessment & Plan:   Bradee was seen today for hypertension, hypothyroidism and anemia.  Diagnoses and all orders for this  visit:  Essential hypertension- his blood pressure is well-controlled, electrolytes and renal function are stable. -     Basic metabolic panel; Future  Pernicious anemia- his anemia has not improved and his B12 level is borderline low and is decreasing, I've asked him to start high-dose B12 supplementation. -     CBC with Differential/Platelet; Future -     IBC panel; Future -     Ferritin; Future -     Folate; Future -     Vitamin B12; Future -     cyanocobalamin 2000 MCG tablet; Take 1 tablet (2,000 mcg total) by mouth daily.  Iron deficiency anemia- this has improved. -     CBC with Differential/Platelet; Future -     IBC panel; Future -     Ferritin; Future   I have discontinued Mr. Garbett torsemide and SPIRIVA HANDIHALER. I am also having him start on cyanocobalamin. Additionally, I am having him maintain his aspirin, sildenafil, atorvastatin, thiamine, Cholecalciferol, levothyroxine, losartan, spironolactone, indapamide, risperiDONE, metoprolol succinate, albuterol, triamcinolone cream, famotidine, pantoprazole, zolpidem, Vitamin D (Ergocalciferol), and  STIOLTO RESPIMAT.  Meds ordered this encounter  Medications  . Vitamin D, Ergocalciferol, (DRISDOL) 50000 units CAPS capsule    Sig: Take 50,000 Units by mouth once a week.    Refill:  3  . DISCONTD: SPIRIVA HANDIHALER 18 MCG inhalation capsule    Sig: PLACE 1 CAPSULE (18 MCG TOTAL) INTO INHALER AND INHALE DAILY.    Refill:  12  . STIOLTO RESPIMAT 2.5-2.5 MCG/ACT AERS    Sig: INHALE 2 PUFFS INTO THE LUNGS DAILY.    Refill:  11  . cyanocobalamin 2000 MCG tablet    Sig: Take 1 tablet (2,000 mcg total) by mouth daily.    Dispense:  90 tablet    Refill:  3     Follow-up: Return in about 4 months (around 10/17/2016).  Scarlette Calico, MD

## 2016-06-17 NOTE — Patient Instructions (Signed)
Iron Deficiency Anemia, Adult Anemia is a condition in which there are less red blood cells or hemoglobin in the blood than normal. Hemoglobin is the part of red blood cells that carries oxygen. Iron deficiency anemia is anemia caused by too little iron. It is the most common type of anemia. It may leave you tired and short of breath. CAUSES   Lack of iron in the diet.  Poor absorption of iron, as seen with intestinal disorders.  Intestinal bleeding.  Heavy periods. SIGNS AND SYMPTOMS  Mild anemia may not be noticeable. Symptoms may include:  Fatigue.  Headache.  Pale skin.  Weakness.  Tiredness.  Shortness of breath.  Dizziness.  Cold hands and feet.  Fast or irregular heartbeat. DIAGNOSIS  Diagnosis requires a thorough evaluation and physical exam by your health care provider. Blood tests are generally used to confirm iron deficiency anemia. Additional tests may be done to find the underlying cause of your anemia. These may include:  Testing for blood in the stool (fecal occult blood test).  A procedure to see inside the colon and rectum (colonoscopy).  A procedure to see inside the esophagus and stomach (endoscopy). TREATMENT  Iron deficiency anemia is treated by correcting the cause of the deficiency. Treatment may involve:  Adding iron-rich foods to your diet.  Taking iron supplements. Pregnant or breastfeeding women need to take extra iron because their normal diet usually does not provide the required amount.  Taking vitamins. Vitamin C improves the absorption of iron. Your health care provider may recommend that you take your iron tablets with a glass of orange juice or vitamin C supplement.  Medicines to make heavy menstrual flow lighter.  Surgery. HOME CARE INSTRUCTIONS   Take iron as directed by your health care provider.  If you cannot tolerate taking iron supplements by mouth, talk to your health care provider about taking them through a vein  (intravenously) or an injection into a muscle.  For the best iron absorption, iron supplements should be taken on an empty stomach. If you cannot tolerate them on an empty stomach, you may need to take them with food.  Do not drink milk or take antacids at the same time as your iron supplements. Milk and antacids may interfere with the absorption of iron.  Iron supplements can cause constipation. Make sure to include fiber in your diet to prevent constipation. A stool softener may also be recommended.  Take vitamins as directed by your health care provider.  Eat a diet rich in iron. Foods high in iron include liver, lean beef, whole-grain bread, eggs, dried fruit, and dark green leafy vegetables. SEEK IMMEDIATE MEDICAL CARE IF:   You faint. If this happens, do not drive. Call your local emergency services (911 in [place] if no other help is available.  You have chest pain.  You feel nauseous or vomit.  You have severe or increased shortness of breath with activity.  You feel weak.  You have a rapid heartbeat.  You have unexplained sweating.  You become light-headed when getting up from a chair or bed. MAKE SURE YOU:   Understand these instructions.  Will watch your condition.  Will get help right away if you are not doing well or get worse.   This information is not intended to replace advice given to you by your health care provider. Make sure you discuss any questions you have with your health care provider.   Document Released: 10/15/2000 Document Revised: 11/08/2014 Document Reviewed: 06/25/2013 Elsevier   Interactive Patient Education 2016 [employer]  

## 2016-06-17 NOTE — Progress Notes (Signed)
Pre visit review using our clinic review tool, if applicable. No additional management support is needed unless otherwise documented below in the visit note. 

## 2016-06-17 NOTE — Telephone Encounter (Signed)
Pt informed that forms have been faxed to [employer] (employer), copy mailed to pt and a copy has been sent to scan.

## 2016-06-18 MED ORDER — CYANOCOBALAMIN 2000 MCG PO TABS: 2000.0000 ug | ORAL_TABLET | Freq: Every day | ORAL | 3 refills | Status: AC

## 2016-06-19 ENCOUNTER — Encounter: Payer: Self-pay | Admitting: Internal Medicine

## 2016-07-06 ENCOUNTER — Encounter: Payer: Self-pay | Admitting: Internal Medicine

## 2016-07-06 ENCOUNTER — Ambulatory Visit (INDEPENDENT_AMBULATORY_CARE_PROVIDER_SITE_OTHER): Payer: PRIVATE HEALTH INSURANCE | Admitting: Internal Medicine

## 2016-07-06 ENCOUNTER — Other Ambulatory Visit (INDEPENDENT_AMBULATORY_CARE_PROVIDER_SITE_OTHER): Payer: PRIVATE HEALTH INSURANCE

## 2016-07-06 VITALS — BP 112/58 | HR 76 | Temp 98.3°F | Resp 16 | Ht 71.0 in | Wt 211.4 lb

## 2016-07-06 DIAGNOSIS — R270 Ataxia, unspecified: Secondary | ICD-10-CM

## 2016-07-06 DIAGNOSIS — Z8673 Personal history of transient ischemic attack (TIA), and cerebral infarction without residual deficits: Secondary | ICD-10-CM | POA: Diagnosis not present

## 2016-07-06 DIAGNOSIS — K7290 Hepatic failure, unspecified without coma: Secondary | ICD-10-CM

## 2016-07-06 DIAGNOSIS — N183 Chronic kidney disease, stage 3 unspecified: Secondary | ICD-10-CM

## 2016-07-06 DIAGNOSIS — I10 Essential (primary) hypertension: Secondary | ICD-10-CM

## 2016-07-06 DIAGNOSIS — K7682 Hepatic encephalopathy: Secondary | ICD-10-CM

## 2016-07-06 DIAGNOSIS — I639 Cerebral infarction, unspecified: Secondary | ICD-10-CM

## 2016-07-06 DIAGNOSIS — D509 Iron deficiency anemia, unspecified: Secondary | ICD-10-CM | POA: Diagnosis not present

## 2016-07-06 DIAGNOSIS — K5900 Constipation, unspecified: Secondary | ICD-10-CM

## 2016-07-06 DIAGNOSIS — R531 Weakness: Secondary | ICD-10-CM

## 2016-07-06 LAB — COMPREHENSIVE METABOLIC PANEL
ALK PHOS: 99 U/L (ref 39–117)
ALT: 35 U/L (ref 0–53)
AST: 39 U/L — AB (ref 0–37)
Albumin: 3.9 g/dL (ref 3.5–5.2)
BILIRUBIN TOTAL: 1.3 mg/dL — AB (ref 0.2–1.2)
BUN: 32 mg/dL — AB (ref 6–23)
CO2: 25 meq/L (ref 19–32)
Calcium: 9.3 mg/dL (ref 8.4–10.5)
Chloride: 105 mEq/L (ref 96–112)
Creatinine, Ser: 2.24 mg/dL — ABNORMAL HIGH (ref 0.40–1.50)
GFR: 32.47 mL/min — ABNORMAL LOW (ref >60.00)
Glucose, Bld: 118 mg/dL — ABNORMAL HIGH (ref 70–99)
Potassium: 3.5 mEq/L (ref 3.5–5.1)
SODIUM: 137 meq/L (ref 135–145)
TOTAL PROTEIN: 6.6 g/dL (ref 6.0–8.3)

## 2016-07-06 LAB — CBC WITH DIFFERENTIAL/PLATELET
BASOS ABS: 0 10*3/uL (ref 0.0–0.1)
Basophils Relative: 0.3 % (ref 0.0–3.0)
Eosinophils Absolute: 0.3 10*3/uL (ref 0.0–0.7)
Eosinophils Relative: 3.7 % (ref 0.0–5.0)
HCT: 40.2 % (ref 39.0–52.0)
Hemoglobin: 13.7 g/dL (ref 13.0–17.0)
LYMPHS ABS: 2.4 10*3/uL (ref 0.7–4.0)
Lymphocytes Relative: 33.9 % (ref 12.0–46.0)
MCHC: 34.1 g/dL (ref 30.0–36.0)
MCV: 94.1 fl (ref 78.0–100.0)
MONO ABS: 0.7 10*3/uL (ref 0.1–1.0)
Monocytes Relative: 10.2 % (ref 3.0–12.0)
NEUTROS ABS: 3.6 10*3/uL (ref 1.4–7.7)
NEUTROS PCT: 51.9 % (ref 43.0–77.0)
PLATELETS: 108 10*3/uL — AB (ref 150.0–400.0)
RBC: 4.27 Mil/uL (ref 4.22–5.81)
RDW: 18.2 % — ABNORMAL HIGH (ref 11.5–15.5)
WBC: 6.9 10*3/uL (ref 4.0–10.5)

## 2016-07-06 MED ORDER — LACTULOSE 10 G PO PACK: 10.0000 g | PACK | Freq: Three times a day (TID) | ORAL | 11 refills | Status: AC

## 2016-07-06 NOTE — Progress Notes (Signed)
Pre visit review using our clinic review tool, if applicable. No additional management support is needed unless otherwise documented below in the visit note. 

## 2016-07-06 NOTE — Patient Instructions (Signed)

## 2016-07-06 NOTE — Progress Notes (Signed)
Subjective:  Patient ID: Jon Boyd, male    DOB: 12-16-1960  Age: 55 y.o. MRN: [id number]  CC: Anemia   HPI [name] presents for a 2 week history of gradual onset of drooling, fatigue, walking and talking slowly, feeling forgetful, trouble sleeping, and diffuse weakness. He denies headache/nausea/vomiting/visual disturbance/decreased hearing/fever/chills/coughing/palpitations/or syncope. He also complains of constipation.  Outpatient Medications Prior to Visit  Medication Sig Dispense Refill  . albuterol (PROVENTIL HFA;VENTOLIN HFA) 108 (90 Base) MCG/ACT inhaler Inhale 2 puffs into the lungs every 6 (six) hours as needed for wheezing or shortness of breath. 1 Inhaler 11  . aspirin 81 MG chewable tablet Chew 81 mg by mouth daily.      Marland Kitchen atorvastatin (LIPITOR) 20 MG tablet Take 1 tablet (20 mg total) by mouth daily at 6 PM. 90 tablet 3  . Cholecalciferol 50000 units TABS Take 1 tablet by mouth once a week. 12 tablet 3  . cyanocobalamin 2000 MCG tablet Take 1 tablet (2,000 mcg total) by mouth daily. 90 tablet 3  . famotidine (PEPCID) 20 MG tablet One at bedtime 30 tablet 2  . indapamide (LOZOL) 1.25 MG tablet Take 1 tablet (1.25 mg total) by mouth daily. 30 tablet 3  . levothyroxine (SYNTHROID) 175 MCG tablet Take 1 tablet (175 mcg total) by mouth daily before breakfast. 90 tablet 1  . losartan (COZAAR) 50 MG tablet TAKE 1 TABLET (50 MG TOTAL) BY MOUTH DAILY.  3  . metoprolol succinate (TOPROL-XL) 25 MG 24 hr tablet Take 1 tablet (25 mg total) by mouth daily. 90 tablet 3  . pantoprazole (PROTONIX) 40 MG tablet Take 1 tablet (40 mg total) by mouth daily. Take 30-60 min before first meal of the day 30 tablet 2  . risperiDONE (RISPERDAL) 1 MG tablet Take 1 tablet (1 mg total) by mouth at bedtime. 30 tablet 1  . sildenafil (VIAGRA) 100 MG tablet Take 0.5-1 tablets (50-100 mg total) by mouth daily as needed for erectile dysfunction. 10 tablet 3  . spironolactone (ALDACTONE) 100 MG tablet  Take 1 tablet (100 mg total) by mouth daily. 30 tablet 3  . STIOLTO RESPIMAT 2.5-2.5 MCG/ACT AERS INHALE 2 PUFFS INTO THE LUNGS DAILY.  11  . thiamine (VITAMIN B-1) 100 MG tablet Take 100 mg by mouth daily.    Marland Kitchen triamcinolone cream (KENALOG) 0.5 % Apply 1 application topically 3 (three) times daily. 60 g 2  . Vitamin D, Ergocalciferol, (DRISDOL) 50000 units CAPS capsule Take 50,000 Units by mouth once a week.  3  . zolpidem (AMBIEN) 5 MG tablet Take 1 tablet (5 mg total) by mouth at bedtime as needed. for sleep 30 tablet 3   No facility-administered medications prior to visit.     ROS Review of Systems  Constitutional: Positive for fatigue.  HENT: Negative.  Negative for sinus pressure, trouble swallowing and voice change.   Eyes: Negative.  Negative for visual disturbance.  Respiratory: Negative for cough, choking, chest tightness, shortness of breath, wheezing and stridor.   Cardiovascular: Negative for chest pain, palpitations and leg swelling.  Gastrointestinal: Positive for constipation. Negative for abdominal distention, abdominal pain, anal bleeding, blood in stool, diarrhea, nausea and rectal pain.  Endocrine: Negative.   Genitourinary: Negative.  Negative for decreased urine volume, difficulty urinating, dysuria, frequency, hematuria and urgency.  Musculoskeletal: Negative.  Negative for arthralgias, back pain, joint swelling, myalgias and neck pain.  Skin: Negative.  Negative for color change and rash.  Neurological: Positive for weakness. Negative for  dizziness, tremors, syncope, speech difficulty, light-headedness, numbness and headaches.  Psychiatric/Behavioral: Positive for confusion, decreased concentration and sleep disturbance. Negative for agitation, behavioral problems, dysphoric mood, hallucinations, self-injury and suicidal ideas. The patient is not nervous/anxious and is not hyperactive.     Objective:  BP (!) 112/58 (BP Location: Left Arm, Patient Position: Sitting,  Cuff Size: Normal)   Pulse 76   Temp 98.3 F (36.8 C) (Oral)   Resp 16   Ht 5' 11" (1.803 m)   Wt 211 lb 6 oz (95.9 kg)   SpO2 98%   BMI 29.48 kg/m   BP Readings from Last 3 Encounters:  07/06/16 (!) 112/58  06/17/16 (!) 100/50  05/25/16 104/60    Wt Readings from Last 3 Encounters:  07/06/16 211 lb 6 oz (95.9 kg)  06/17/16 209 lb (94.8 kg)  05/25/16 201 lb (91.2 kg)    Physical Exam  Constitutional: No distress.  HENT:  Mouth/Throat: Oropharynx is clear and moist. No oropharyngeal exudate.  Eyes: Conjunctivae are normal. Right eye exhibits no discharge. Left eye exhibits no discharge. No scleral icterus.  Neck: Normal range of motion. Neck supple. No JVD present. No tracheal deviation present. No thyromegaly present.  Cardiovascular: Normal rate, regular rhythm, normal heart sounds and intact distal pulses.  Exam reveals no gallop and no friction rub.   No murmur heard. Pulmonary/Chest: Effort normal and breath sounds normal. No stridor. No respiratory distress. He has no wheezes. He has no rales. He exhibits no tenderness.  Abdominal: Soft. Bowel sounds are normal. He exhibits no distension and no mass. There is no tenderness. There is no rebound and no guarding.  Musculoskeletal: Normal range of motion. He exhibits no edema, tenderness or deformity.  Lymphadenopathy:    He has no cervical adenopathy.  Neurological: He is alert. He displays no atrophy and normal reflexes. No cranial nerve deficit or sensory deficit. He exhibits abnormal muscle tone. He displays a negative Romberg sign. He displays no seizure activity. Coordination and gait abnormal.  Reflex Scores:      Tricep reflexes are 0 on the right side and 0 on the left side.      Bicep reflexes are 0 on the right side and 0 on the left side.      Brachioradialis reflexes are 0 on the right side and 0 on the left side.      Patellar reflexes are 0 on the right side and 0 on the left side.      Achilles reflexes are  0 on the right side and 0 on the left side. He is mildly ataxic and diffusely weak but there are no focal neurological abnormalities. There is no asterixis or clonus  Skin: He is not diaphoretic.  Vitals reviewed.   Lab Results  Component Value Date   WBC 6.9 07/06/2016   HGB 13.7 07/06/2016   HCT 40.2 07/06/2016   PLT 108.0 (L) 07/06/2016   GLUCOSE 118 (H) 07/06/2016   CHOL 115 02/19/2016   TRIG 58.0 02/19/2016   HDL 44.60 02/19/2016   LDLDIRECT 51.0 04/07/2015   LDLCALC 59 02/19/2016   ALT 35 07/06/2016   AST 39 (H) 07/06/2016   NA 137 07/06/2016   K 3.5 07/06/2016   CL 105 07/06/2016   CREATININE 2.24 (H) 07/06/2016   BUN 32 (H) 07/06/2016   CO2 25 07/06/2016   TSH 0.36 04/01/2016   PSA 0.26 06/14/2011   INR 1.39 12/19/2015   HGBA1C 6.0 02/19/2016    Dg Chest  2 View  Result Date: 05/26/2016 CLINICAL DATA:  Follow-up of COPD, 10 days of left arm pain, otherwise no complaints. Current smoker, history of hyperlipidemia, cirrhosis, hypertension, and previous CVA EXAM: CHEST  2 VIEW COMPARISON:  PA and lateral chest x-ray of April 07, 2016 FINDINGS: The lungs are mildly hyperinflated. There is no focal infiltrate. Previous demonstrated increased density overlying the lower right heart border is no longer evident. There is no pleural effusion. The heart and pulmonary vascularity are normal. The mediastinum is normal in width. There is calcification in the wall of the aortic arch. IMPRESSION: 1. COPD.  No pneumonia nor CHF. 2. Aortic atherosclerosis. Electronically Signed   By: David  Martinique M.D.   On: 05/26/2016 08:07   Assessment & Plan:   Dax was seen today for anemia.  Diagnoses and all orders for this visit:  Essential hypertension- his blood pressures adequately well-controlled. -     Comprehensive metabolic panel; Future  Cerebrovascular accident (CVA), unspecified mechanism ([place])- he has a myriad of symptoms but his neurologic is not exam is nonfocal with the  exception of ataxia and decreased tone, I will get an MRI of his brain to see if he has had another CVA. -     MR Brain Wo Contrast; Future  Encephalopathy, hepatic ([place])- I've ordered a urine drug screen to see if there is a recurrence of substance abuse, I think many of his symptoms are related to hepatic encephalopathy so I've asked him to start taking lactulose. His labs show mild renal insufficiency but there are no other abnormalities such a leg electrolyte disorder, leukocytosis, or anemia. -     Comprehensive metabolic panel; Future -     lactulose (CEPHULAC) 10 g packet; Take 1 packet (10 g total) by mouth 3 (three) times daily.  Iron deficiency anemia- improvement noted -     CBC with Differential/Platelet; Future  Ataxia- will screen his brain for another CVA, will check a urine drug screen, will treat the hepatic encephalopathy with lactulose. -     MR Brain Wo Contrast; Future  Constipation, unspecified constipation type -     lactulose (CEPHULAC) 10 g packet; Take 1 packet (10 g total) by mouth 3 (three) times daily.  Chronic renal disease, stage 3, moderately decreased glomerular filtration rate (GFR) between 30-59 mL/min/1.73 square meter- his creatinine clearance has decreased to 44, and concerned he may be developing hepatorenal syndrome, I've asked him to see nephrology urgently to evaluate other causes of acute renal insufficiency. -     Ambulatory referral to Nephrology   I am having Mr. Flippo start on lactulose. I am also having him maintain his aspirin, sildenafil, atorvastatin, thiamine, Cholecalciferol, levothyroxine, losartan, spironolactone, indapamide, risperiDONE, metoprolol succinate, albuterol, triamcinolone cream, famotidine, pantoprazole, zolpidem, Vitamin D (Ergocalciferol), STIOLTO RESPIMAT, and cyanocobalamin.  Meds ordered this encounter  Medications  . lactulose (CEPHULAC) 10 g packet    Sig: Take 1 packet (10 g total) by mouth 3 (three) times daily.     Dispense:  30 each    Refill:  11     Follow-up: Return in about 1 week (around 07/13/2016).  Scarlette Calico, MD

## 2016-07-07 ENCOUNTER — Other Ambulatory Visit: Payer: Self-pay | Admitting: *Deleted

## 2016-07-07 ENCOUNTER — Telehealth: Payer: Self-pay

## 2016-07-07 DIAGNOSIS — R600 Localized edema: Secondary | ICD-10-CM

## 2016-07-07 DIAGNOSIS — N183 Chronic kidney disease, stage 3 unspecified: Secondary | ICD-10-CM | POA: Insufficient documentation

## 2016-07-07 MED ORDER — INDAPAMIDE 1.25 MG PO TABS: 1.2500 mg | ORAL_TABLET | Freq: Every day | ORAL | 1 refills | Status: DC

## 2016-07-07 MED ORDER — SPIRONOLACTONE 100 MG PO TABS: 100.0000 mg | ORAL_TABLET | Freq: Every day | ORAL | 1 refills | Status: DC

## 2016-07-07 NOTE — Telephone Encounter (Signed)
Patient called back about labs. I wasn't sure I should give these results. Please call patient and inform him of the labs. Thank you.

## 2016-07-08 ENCOUNTER — Telehealth: Payer: Self-pay | Admitting: Emergency Medicine

## 2016-07-08 NOTE — Telephone Encounter (Signed)
Pt called and stated the prescription lactulose (CEPHULAC) 10 g packet was too expensive and wanted to know if he could be prescribed something different that's cheaper. Please advise thanks.

## 2016-07-08 NOTE — Telephone Encounter (Signed)
Please advise 

## 2016-07-09 ENCOUNTER — Telehealth: Payer: Self-pay | Admitting: Internal Medicine

## 2016-07-09 ENCOUNTER — Other Ambulatory Visit: Payer: Self-pay | Admitting: Internal Medicine

## 2016-07-09 DIAGNOSIS — N183 Chronic kidney disease, stage 3 unspecified: Secondary | ICD-10-CM

## 2016-07-09 DIAGNOSIS — I10 Essential (primary) hypertension: Secondary | ICD-10-CM

## 2016-07-09 NOTE — Telephone Encounter (Signed)
If he is still constipated he can get over-the-counter MiraLAX and take it daily for the next week to try to induce bowel movements

## 2016-07-09 NOTE — Telephone Encounter (Signed)
Pt informed of results.

## 2016-07-09 NOTE — Telephone Encounter (Signed)
Yes, approved

## 2016-07-09 NOTE — Telephone Encounter (Signed)
Spoke to pt and reviewed labs 

## 2016-07-09 NOTE — Telephone Encounter (Signed)
Lab ordered as requested Please communicate with the patient to stop taking losartan

## 2016-07-09 NOTE — Telephone Encounter (Signed)
Erline Levine from [facility] called regarding pts referral. She states Dr. Moshe Cipro wants pt to stop taking the Cozaar (losartin) and have his creatinine levels checked again on Monday and send the results to them. Please advise

## 2016-07-09 NOTE — Telephone Encounter (Signed)
Spoke with pt. He is aware to stop losartin. He is scheduled to come in Tuesday to see you and he is wanting to get his labs done then. Spoke with [facility] and they said that will be fine.

## 2016-07-09 NOTE — Telephone Encounter (Signed)
LVM for pt to call back as soon as possible.   Please come and get me if he calls back.

## 2016-07-09 NOTE — Telephone Encounter (Signed)
Please advise if the request from [facility] is approved?

## 2016-07-13 ENCOUNTER — Other Ambulatory Visit (INDEPENDENT_AMBULATORY_CARE_PROVIDER_SITE_OTHER): Payer: PRIVATE HEALTH INSURANCE

## 2016-07-13 ENCOUNTER — Ambulatory Visit (INDEPENDENT_AMBULATORY_CARE_PROVIDER_SITE_OTHER): Payer: PRIVATE HEALTH INSURANCE | Admitting: Internal Medicine

## 2016-07-13 ENCOUNTER — Encounter: Payer: Self-pay | Admitting: Internal Medicine

## 2016-07-13 VITALS — BP 122/76 | HR 70 | Temp 98.2°F | Ht 71.0 in | Wt 221.0 lb

## 2016-07-13 DIAGNOSIS — N183 Chronic kidney disease, stage 3 unspecified: Secondary | ICD-10-CM

## 2016-07-13 DIAGNOSIS — K7290 Hepatic failure, unspecified without coma: Secondary | ICD-10-CM

## 2016-07-13 DIAGNOSIS — Z23 Encounter for immunization: Secondary | ICD-10-CM | POA: Diagnosis not present

## 2016-07-13 DIAGNOSIS — I10 Essential (primary) hypertension: Secondary | ICD-10-CM | POA: Diagnosis not present

## 2016-07-13 DIAGNOSIS — K7682 Hepatic encephalopathy: Secondary | ICD-10-CM

## 2016-07-13 LAB — URINALYSIS, ROUTINE W REFLEX MICROSCOPIC
Bilirubin Urine: NEGATIVE
HGB URINE DIPSTICK: NEGATIVE
Ketones, ur: NEGATIVE
LEUKOCYTES UA: NEGATIVE
Nitrite: NEGATIVE
RBC / HPF: NONE SEEN (ref 0–?)
Specific Gravity, Urine: 1.015 (ref 1.000–1.030)
TOTAL PROTEIN, URINE-UPE24: NEGATIVE
URINE GLUCOSE: NEGATIVE
Urobilinogen, UA: 2 — AB (ref 0.0–1.0)
WBC, UA: NONE SEEN (ref 0–?)
pH: 7 (ref 5.0–8.0)

## 2016-07-13 LAB — BASIC METABOLIC PANEL
BUN: 15 mg/dL (ref 6–23)
CALCIUM: 9.3 mg/dL (ref 8.4–10.5)
CO2: 29 mEq/L (ref 19–32)
Chloride: 106 mEq/L (ref 96–112)
Creatinine, Ser: 1.11 mg/dL (ref 0.40–1.50)
GFR: 73 mL/min (ref >60.00)
GLUCOSE: 171 mg/dL — AB (ref 70–99)
Potassium: 3.8 mEq/L (ref 3.5–5.1)
SODIUM: 140 meq/L (ref 135–145)

## 2016-07-13 MED ORDER — RISPERIDONE 1 MG PO TABS
1.0000 mg | ORAL_TABLET | Freq: Every day | ORAL | 0 refills | Status: DC
Start: 2016-07-13 — End: 2016-10-08

## 2016-07-13 NOTE — Progress Notes (Signed)
Subjective:  Patient ID: Jon Boyd, male    DOB: 09-14-1961  Age: 55 y.o. MRN: [id number]  CC: Hypertension   HPI KORTEZ HAAGEN presents for follow-up on his blood pressure, his mental status, and renal function. The last time I saw about 5 days ago he complained of confusion and ataxia. He was also suffering from constipation. He has started taking lactulose and tells me he is feeling much better. He says his thinking has improved. His ataxia has resolved as well. He is having about 2-3 loose bowel movements a day. He also tells me his blood pressure has been well controlled. A nephrologist recommended that we stop the ARB and recheck his renal function which is why he is back today.  Outpatient Medications Prior to Visit  Medication Sig Dispense Refill  . albuterol (PROVENTIL HFA;VENTOLIN HFA) 108 (90 Base) MCG/ACT inhaler Inhale 2 puffs into the lungs every 6 (six) hours as needed for wheezing or shortness of breath. 1 Inhaler 11  . aspirin 81 MG chewable tablet Chew 81 mg by mouth daily.      Marland Kitchen atorvastatin (LIPITOR) 20 MG tablet Take 1 tablet (20 mg total) by mouth daily at 6 PM. 90 tablet 3  . Cholecalciferol 50000 units TABS Take 1 tablet by mouth once a week. 12 tablet 3  . cyanocobalamin 2000 MCG tablet Take 1 tablet (2,000 mcg total) by mouth daily. 90 tablet 3  . famotidine (PEPCID) 20 MG tablet One at bedtime 30 tablet 2  . indapamide (LOZOL) 1.25 MG tablet Take 1 tablet (1.25 mg total) by mouth daily. 90 tablet 1  . lactulose (CEPHULAC) 10 g packet Take 1 packet (10 g total) by mouth 3 (three) times daily. 30 each 11  . levothyroxine (SYNTHROID) 175 MCG tablet Take 1 tablet (175 mcg total) by mouth daily before breakfast. 90 tablet 1  . metoprolol succinate (TOPROL-XL) 25 MG 24 hr tablet Take 1 tablet (25 mg total) by mouth daily. 90 tablet 3  . pantoprazole (PROTONIX) 40 MG tablet Take 1 tablet (40 mg total) by mouth daily. Take 30-60 min before first meal of the day 30 tablet  2  . sildenafil (VIAGRA) 100 MG tablet Take 0.5-1 tablets (50-100 mg total) by mouth daily as needed for erectile dysfunction. 10 tablet 3  . spironolactone (ALDACTONE) 100 MG tablet Take 1 tablet (100 mg total) by mouth daily. 90 tablet 1  . STIOLTO RESPIMAT 2.5-2.5 MCG/ACT AERS INHALE 2 PUFFS INTO THE LUNGS DAILY.  11  . thiamine (VITAMIN B-1) 100 MG tablet Take 100 mg by mouth daily.    Marland Kitchen triamcinolone cream (KENALOG) 0.5 % Apply 1 application topically 3 (three) times daily. 60 g 2  . Vitamin D, Ergocalciferol, (DRISDOL) 50000 units CAPS capsule Take 50,000 Units by mouth once a week.  3  . zolpidem (AMBIEN) 5 MG tablet Take 1 tablet (5 mg total) by mouth at bedtime as needed. for sleep 30 tablet 3  . risperiDONE (RISPERDAL) 1 MG tablet Take 1 tablet (1 mg total) by mouth at bedtime. 30 tablet 1   No facility-administered medications prior to visit.     ROS Review of Systems  Constitutional: Negative.  Negative for activity change, appetite change, chills, diaphoresis, fatigue and fever.  HENT: Negative.  Negative for trouble swallowing.   Eyes: Negative.  Negative for visual disturbance.  Respiratory: Negative for cough, choking, chest tightness, shortness of breath and stridor.   Cardiovascular: Negative for chest pain, palpitations and leg  swelling.  Gastrointestinal: Negative.  Negative for abdominal pain, blood in stool, constipation, diarrhea, nausea and vomiting.  Endocrine: Negative.   Genitourinary: Negative.   Musculoskeletal: Negative.  Negative for back pain and neck pain.  Skin: Negative.  Negative for color change and rash.  Allergic/Immunologic: Negative.   Neurological: Negative for dizziness, weakness, light-headedness, numbness and headaches.  Hematological: Negative.  Negative for adenopathy. Does not bruise/bleed easily.  Psychiatric/Behavioral: Positive for decreased concentration and sleep disturbance. Negative for agitation, confusion and dysphoric mood. The  patient is not nervous/anxious.     Objective:  BP 122/76 (BP Location: Left Arm, Patient Position: Sitting, Cuff Size: Normal)   Pulse 70   Temp 98.2 F (36.8 C) (Oral)   Ht 5\' 11"  (1.803 m)   Wt 221 lb (100.2 kg)   SpO2 97%   BMI 30.82 kg/m   BP Readings from Last 3 Encounters:  07/13/16 122/76  07/06/16 (!) 112/58  06/17/16 (!) 100/50    Wt Readings from Last 3 Encounters:  07/13/16 221 lb (100.2 kg)  07/06/16 211 lb 6 oz (95.9 kg)  06/17/16 209 lb (94.8 kg)    Physical Exam  Constitutional: He is oriented to person, place, and time. No distress.  HENT:  Mouth/Throat: Oropharynx is clear and moist. No oropharyngeal exudate.  Eyes: Conjunctivae are normal. Right eye exhibits no discharge. Left eye exhibits no discharge. No scleral icterus.  Neck: Normal range of motion. No JVD present. No tracheal deviation present. No thyromegaly present.  Cardiovascular: Normal rate, regular rhythm, normal heart sounds and intact distal pulses.  Exam reveals no gallop and no friction rub.   No murmur heard. Pulmonary/Chest: Effort normal and breath sounds normal. No stridor. No respiratory distress. He has no wheezes. He has no rales. He exhibits no tenderness.  Abdominal: Soft. Bowel sounds are normal. He exhibits no distension and no mass. There is no tenderness. There is no rebound and no guarding.  Musculoskeletal: Normal range of motion. He exhibits no edema, tenderness or deformity.  Lymphadenopathy:    He has no cervical adenopathy.  Neurological: He is alert and oriented to person, place, and time. He displays normal reflexes. He exhibits normal muscle tone. Coordination normal.  Skin: Skin is warm and dry. No rash noted. He is not diaphoretic. No erythema. No pallor.  Psychiatric: He has a normal mood and affect. His behavior is normal. Judgment and thought content normal.  Vitals reviewed.   Lab Results  Component Value Date   WBC 6.9 07/06/2016   HGB 13.7 07/06/2016    HCT 40.2 07/06/2016   PLT 108.0 (L) 07/06/2016   GLUCOSE 171 (H) 07/13/2016   CHOL 115 02/19/2016   TRIG 58.0 02/19/2016   HDL 44.60 02/19/2016   LDLDIRECT 51.0 04/07/2015   LDLCALC 59 02/19/2016   ALT 35 07/06/2016   AST 39 (H) 07/06/2016   NA 140 07/13/2016   K 3.8 07/13/2016   CL 106 07/13/2016   CREATININE 1.11 07/13/2016   BUN 15 07/13/2016   CO2 29 07/13/2016   TSH 0.36 04/01/2016   PSA 0.26 06/14/2011   INR 1.39 12/19/2015   HGBA1C 6.0 02/19/2016    Dg Chest 2 View  Result Date: 05/26/2016 CLINICAL DATA:  Follow-up of COPD, 10 days of left arm pain, otherwise no complaints. Current smoker, history of hyperlipidemia, cirrhosis, hypertension, and previous CVA EXAM: CHEST  2 VIEW COMPARISON:  PA and lateral chest x-ray of April 07, 2016 FINDINGS: The lungs are mildly hyperinflated. There is  no focal infiltrate. Previous demonstrated increased density overlying the lower right heart border is no longer evident. There is no pleural effusion. The heart and pulmonary vascularity are normal. The mediastinum is normal in width. There is calcification in the wall of the aortic arch. IMPRESSION: 1. COPD.  No pneumonia nor CHF. 2. Aortic atherosclerosis. Electronically Signed   By: David  Martinique M.D.   On: 05/26/2016 08:07   Assessment & Plan:   Drakkar was seen today for hypertension.  Diagnoses and all orders for this visit:  Essential hypertension- His blood pressures well controlled despite stopping the ARB, his renal function has improved as well as electrolytes remain normal. We'll continue the diuretics. -     Basic metabolic panel; Future -     Urinalysis, Routine w reflex microscopic (not at [facility]); Future  Chronic renal disease, stage 3, moderately decreased glomerular filtration rate (GFR) between 30-59 mL/min/1.73 square meter- marked improvement in renal function with the discontinuation of the ARB, will discontinue the ARB permanently and will continue the diuretic for blood  pressure control. -     Basic metabolic panel; Future -     Urinalysis, Routine w reflex microscopic (not at [facility]); Future  Need for prophylactic vaccination and inoculation against influenza -     Flu Vaccine QUAD 36+ mos IM  Encephalopathy, hepatic ([place])- marked improvement noted with lactulose, will continue.  Other orders -     risperiDONE (RISPERDAL) 1 MG tablet; Take 1 tablet (1 mg total) by mouth at bedtime.   I am having Mr. Tregre maintain his aspirin, sildenafil, atorvastatin, thiamine, Cholecalciferol, levothyroxine, metoprolol succinate, albuterol, triamcinolone cream, famotidine, pantoprazole, zolpidem, Vitamin D (Ergocalciferol), STIOLTO RESPIMAT, cyanocobalamin, lactulose, spironolactone, indapamide, and risperiDONE.  Meds ordered this encounter  Medications  . risperiDONE (RISPERDAL) 1 MG tablet    Sig: Take 1 tablet (1 mg total) by mouth at bedtime.    Dispense:  90 tablet    Refill:  0     Follow-up: Return in about 2 months (around 09/12/2016).  Scarlette Calico, MD

## 2016-07-13 NOTE — Patient Instructions (Signed)
Chronic Kidney Disease °Chronic kidney disease occurs when the kidneys are damaged over a long period. The kidneys are two organs that lie on either side of the spine between the middle of the back and the front of the abdomen. The kidneys: °· Remove wastes and extra water from the blood. °· Produce important hormones. These help keep bones strong, regulate blood pressure, and help create red blood cells. °· Balance the fluids and chemicals in the blood and tissues. °A small amount of kidney damage may not cause problems, but a large amount of damage may make it difficult or impossible for the kidneys to work the way they should. If steps are not taken to slow down the kidney damage or stop it from getting worse, the kidneys may stop working permanently. Most of the time, chronic kidney disease does not go away. However, it can often be controlled, and those with the disease can usually live normal lives. °CAUSES °The most common causes of chronic kidney disease are diabetes and high blood pressure (hypertension). Chronic kidney disease may also be caused by: °· Diseases that cause the kidneys' filters to become inflamed. °· Diseases that affect the immune system. °· Genetic diseases. °· Medicines that damage the kidneys, such as anti-inflammatory medicines. °· Poisoning or exposure to toxic substances. °· A reoccurring kidney or urinary infection. °· A problem with urine flow. This may be caused by: °¨ Cancer. °¨ Kidney stones. °¨ An enlarged prostate in males. °SIGNS AND SYMPTOMS °Because the kidney damage in chronic kidney disease occurs slowly, symptoms develop slowly and may not be obvious until the kidney damage becomes severe. A person may have a kidney disease for years without showing any symptoms. Symptoms can include: °· Swelling (edema) of the legs, ankles, or feet. °· Tiredness (lethargy). °· Nausea or vomiting. °· Confusion. °· Problems with urination, such as: °¨ Decreased urine  production. °¨ Frequent urination, especially at night. °¨ Frequent accidents in children who are potty trained. °· Muscle twitches and cramps. °· Shortness of breath. °· Weakness. °· Persistent itchiness. °· Loss of appetite. °· Metallic taste in the mouth. °· Trouble sleeping. °· Slowed development in children. °· Short stature in children. °DIAGNOSIS °Chronic kidney disease may be detected and diagnosed by tests, including blood, urine, imaging, or kidney biopsy tests. °TREATMENT °Most chronic kidney diseases cannot be cured. Treatment usually involves relieving symptoms and preventing or slowing the progression of the disease. Treatment may include: °· A special diet. You may need to avoid alcohol and foods that are salty and high in potassium. °· Medicines. These may: °¨ Lower blood pressure. °¨ Relieve anemia. °¨ Relieve swelling. °¨ Protect the bones. °HOME CARE INSTRUCTIONS °· Follow your prescribed diet.  Your health care provider may instruct you to limit daily salt (sodium) and protein intake. °· Take medicines only as directed by your health care provider. Do not take any new medicines (prescription, over-the-counter, or nutritional supplements) unless approved by your health care provider. Many medicines can worsen your kidney damage or need to have the dose adjusted.   °· Quit smoking if you smoke. Talk to your health care provider about a smoking cessation program. °· Keep all follow-up visits as directed by your health care provider. °· Monitor your blood pressure. °· Start or continue an exercise plan. °· Get immunizations as directed by your health care provider. °· Take vitamin and mineral supplements as directed by your health care provider. °SEEK IMMEDIATE MEDICAL CARE IF: °· Your symptoms get worse or you develop   new symptoms. °· You develop symptoms of end-stage kidney disease. These include: °¨ Headaches. °¨ Abnormally dark or light skin. °¨ Numbness in the hands or feet. °¨ Easy  bruising. °¨ Frequent hiccups. °¨ Menstruation stops. °· You have a fever. °· You have decreased urine production. °· You have pain or bleeding when urinating. °MAKE SURE YOU: °· Understand these instructions. °· Will watch your condition. °· Will get help right away if you are not doing well or get worse. °FOR MORE INFORMATION  °· American Association of Kidney Patients: www.aakp.org °· National Kidney Foundation: www.kidney.org °· [employer]: www.akfinc.org °· [employer] [facility]: www.lifeoptions.org and www.kidneyschool.org °  °This information is not intended to replace advice given to you by your health care provider. Make sure you discuss any questions you have with your health care provider. °  °Document Released: 07/27/2008 Document Revised: 11/08/2014 Document Reviewed: 06/16/2012 °Elsevier Interactive Patient Education ©2016 [employer] ° °

## 2016-07-13 NOTE — Progress Notes (Signed)
Pre visit review using our clinic review tool, if applicable. No additional management support is needed unless otherwise documented below in the visit note. 

## 2016-07-14 NOTE — Telephone Encounter (Signed)
Jon Boyd, let me know if I can cancel the referral. Please :)

## 2016-07-15 ENCOUNTER — Ambulatory Visit
Admission: RE | Admit: 2016-07-15 | Discharge: 2016-07-15 | Disposition: A | Discharge status: Home / Self Care | Payer: PRIVATE HEALTH INSURANCE | Source: Ambulatory Visit | Attending: Internal Medicine | Admitting: Internal Medicine

## 2016-07-15 DIAGNOSIS — I639 Cerebral infarction, unspecified: Secondary | ICD-10-CM

## 2016-07-15 DIAGNOSIS — R270 Ataxia, unspecified: Secondary | ICD-10-CM

## 2016-07-19 NOTE — Telephone Encounter (Signed)
LVM for pt to call back as soon as possible.   RE: imaging results and referral question.

## 2016-07-21 NOTE — Telephone Encounter (Signed)
Results given to patient.   Patient stated that he had another episode last Friday. He tried to go out and use the rest room, he didn't have any clothes and he didn't know who his daughter were.  States that he remember feeling fine on Thursday and then when he woke up on Friday morning he was having a "spell" that lasted all day. Pt states that he does not remember Friday and what he just told me is what both of his dtrs told him on Saturday.

## 2016-07-21 NOTE — Telephone Encounter (Addendum)
Amber called back. She confirmed what the stated in the previous conversation. Amber states that patient would respond when spoken to but the response wouldn't make sense to what they asked or stated. Amber admits that on Saturday the patient had told them he did not have a bowel movement on Thursday. Informed dtr that if another episode happens that he needs to go to the ED.

## 2016-07-21 NOTE — Telephone Encounter (Signed)
LM for [name] and [employer] (pt dtrs) to call back regarding the pt statement taken earlier today (listed in this phone note).

## 2016-07-21 NOTE — Telephone Encounter (Signed)
Pt request to speak to the assistant (only). Please call him back

## 2016-07-31 ENCOUNTER — Other Ambulatory Visit: Payer: Self-pay | Admitting: Internal Medicine

## 2016-08-04 ENCOUNTER — Other Ambulatory Visit: Payer: Self-pay | Admitting: Internal Medicine

## 2016-08-18 ENCOUNTER — Other Ambulatory Visit: Payer: Self-pay | Admitting: Internal Medicine

## 2016-08-18 DIAGNOSIS — E038 Other specified hypothyroidism: Secondary | ICD-10-CM

## 2016-08-25 ENCOUNTER — Other Ambulatory Visit: Payer: Self-pay | Admitting: Internal Medicine

## 2016-08-29 ENCOUNTER — Other Ambulatory Visit: Payer: Self-pay | Admitting: Internal Medicine

## 2016-09-07 ENCOUNTER — Ambulatory Visit (INDEPENDENT_AMBULATORY_CARE_PROVIDER_SITE_OTHER): Payer: PRIVATE HEALTH INSURANCE | Admitting: Internal Medicine

## 2016-09-07 ENCOUNTER — Encounter: Payer: Self-pay | Admitting: Internal Medicine

## 2016-09-07 ENCOUNTER — Other Ambulatory Visit (INDEPENDENT_AMBULATORY_CARE_PROVIDER_SITE_OTHER): Payer: PRIVATE HEALTH INSURANCE

## 2016-09-07 VITALS — BP 108/60 | HR 72 | Temp 98.0°F | Resp 16 | Ht 71.0 in | Wt 211.5 lb

## 2016-09-07 DIAGNOSIS — N183 Chronic kidney disease, stage 3 unspecified: Secondary | ICD-10-CM

## 2016-09-07 DIAGNOSIS — Z23 Encounter for immunization: Secondary | ICD-10-CM | POA: Diagnosis not present

## 2016-09-07 DIAGNOSIS — R739 Hyperglycemia, unspecified: Secondary | ICD-10-CM

## 2016-09-07 DIAGNOSIS — I10 Essential (primary) hypertension: Secondary | ICD-10-CM

## 2016-09-07 DIAGNOSIS — Z0000 Encounter for general adult medical examination without abnormal findings: Secondary | ICD-10-CM | POA: Diagnosis not present

## 2016-09-07 DIAGNOSIS — E038 Other specified hypothyroidism: Secondary | ICD-10-CM

## 2016-09-07 LAB — COMPREHENSIVE METABOLIC PANEL
ALK PHOS: 112 U/L (ref 39–117)
ALT: 18 U/L (ref 0–53)
AST: 25 U/L (ref 0–37)
Albumin: 3.8 g/dL (ref 3.5–5.2)
BILIRUBIN TOTAL: 1.1 mg/dL (ref 0.2–1.2)
BUN: 17 mg/dL (ref 6–23)
CO2: 29 mEq/L (ref 19–32)
Calcium: 9.7 mg/dL (ref 8.4–10.5)
Chloride: 105 mEq/L (ref 96–112)
Creatinine, Ser: 1.3 mg/dL (ref 0.40–1.50)
GFR: 60.79 mL/min (ref >60.00)
GLUCOSE: 137 mg/dL — AB (ref 70–99)
Potassium: 4.1 mEq/L (ref 3.5–5.1)
SODIUM: 140 meq/L (ref 135–145)
TOTAL PROTEIN: 6.4 g/dL (ref 6.0–8.3)

## 2016-09-07 LAB — PSA: PSA: 0.08 ng/mL — ABNORMAL LOW (ref 0.10–4.00)

## 2016-09-07 LAB — HEMOGLOBIN A1C: Hgb A1c MFr Bld: 5.8 % (ref 4.6–6.5)

## 2016-09-07 LAB — TSH: TSH: 0.16 u[IU]/mL — ABNORMAL LOW (ref 0.35–4.50)

## 2016-09-07 MED ORDER — LEVOTHYROXINE SODIUM 150 MCG PO TABS: 150.0000 ug | ORAL_TABLET | Freq: Every day | ORAL | 1 refills | Status: DC

## 2016-09-07 NOTE — Progress Notes (Signed)
Subjective:  Patient ID: Jon Boyd, male    DOB: 03-Feb-1961  Age: 55 y.o. MRN: [id number]  CC: Hypertension; Annual Exam; and Hypothyroidism   HPI Jon Boyd presents for a CPX and f/up on HTN and hypoT. He feels well today and offers no complaints.  Outpatient Medications Prior to Visit  Medication Sig Dispense Refill  . aspirin 81 MG chewable tablet Chew 81 mg by mouth daily.      Marland Kitchen atorvastatin (LIPITOR) 20 MG tablet Take 1 tablet (20 mg total) by mouth daily at 6 PM. 90 tablet 3  . cyanocobalamin 2000 MCG tablet Take 1 tablet (2,000 mcg total) by mouth daily. 90 tablet 3  . indapamide (LOZOL) 1.25 MG tablet Take 1 tablet (1.25 mg total) by mouth daily. 90 tablet 1  . lactulose (CEPHULAC) 10 g packet Take 1 packet (10 g total) by mouth 3 (three) times daily. 30 each 11  . metoprolol succinate (TOPROL-XL) 25 MG 24 hr tablet Take 1 tablet (25 mg total) by mouth daily. 90 tablet 3  . pantoprazole (PROTONIX) 40 MG tablet TAKE 1 TABLET (40 MG TOTAL) BY MOUTH DAILY. TAKE 30-60 MIN BEFORE FIRST MEAL OF THE DAY 30 tablet 0  . risperiDONE (RISPERDAL) 1 MG tablet Take 1 tablet (1 mg total) by mouth at bedtime. 90 tablet 0  . sildenafil (VIAGRA) 100 MG tablet Take 0.5-1 tablets (50-100 mg total) by mouth daily as needed for erectile dysfunction. 10 tablet 3  . spironolactone (ALDACTONE) 100 MG tablet Take 1 tablet (100 mg total) by mouth daily. 90 tablet 1  . thiamine (VITAMIN B-1) 100 MG tablet Take 100 mg by mouth daily.    . Vitamin D, Ergocalciferol, (DRISDOL) 50000 units CAPS capsule Take 50,000 Units by mouth once a week.  3  . zolpidem (AMBIEN) 5 MG tablet Take 1 tablet (5 mg total) by mouth at bedtime as needed. for sleep 30 tablet 3  . Cholecalciferol 50000 units TABS Take 1 tablet by mouth once a week. 12 tablet 3  . levothyroxine (SYNTHROID, LEVOTHROID) 175 MCG tablet TAKE 1 TABLET (175 MCG TOTAL) BY MOUTH DAILY BEFORE BREAKFAST. 90 tablet 1  . losartan (COZAAR) 50 MG tablet TAKE  1 TABLET (50 MG TOTAL) BY MOUTH DAILY. 90 tablet 3  . albuterol (PROVENTIL HFA;VENTOLIN HFA) 108 (90 Base) MCG/ACT inhaler Inhale 2 puffs into the lungs every 6 (six) hours as needed for wheezing or shortness of breath. (Patient not taking: Reported on 09/07/2016) 1 Inhaler 11  . [facility] ACID CONTROLLER MAX ST 20 MG tablet TAKE ONE AT BEDTIME 30 tablet 2  . STIOLTO RESPIMAT 2.5-2.5 MCG/ACT AERS INHALE 2 PUFFS INTO THE LUNGS DAILY.  11  . triamcinolone cream (KENALOG) 0.5 % Apply 1 application topically 3 (three) times daily. (Patient not taking: Reported on 09/07/2016) 60 g 2   No facility-administered medications prior to visit.     ROS Review of Systems  Constitutional: Negative for appetite change, chills, fatigue and fever.  HENT: Negative.   Eyes: Negative for visual disturbance.  Respiratory: Negative for cough, chest tightness, shortness of breath and stridor.   Cardiovascular: Negative for chest pain, palpitations and leg swelling.  Gastrointestinal: Negative.  Negative for abdominal pain, constipation, diarrhea, nausea and vomiting.  Endocrine: Negative.   Genitourinary: Negative.  Negative for difficulty urinating.  Musculoskeletal: Negative.  Negative for arthralgias, back pain, myalgias and neck pain.  Skin: Negative.  Negative for color change and rash.  Allergic/Immunologic: Negative.   Neurological:  Negative.   Hematological: Negative.  Negative for adenopathy. Does not bruise/bleed easily.  Psychiatric/Behavioral: Positive for sleep disturbance. Negative for decreased concentration, dysphoric mood, hallucinations and suicidal ideas. The patient is not nervous/anxious.     Objective:  BP 108/60 (BP Location: Left Arm, Patient Position: Sitting, Cuff Size: Normal)   Pulse 72   Temp 98 F (36.7 C) (Oral)   Resp 16   Ht 5\' 11"  (1.803 m)   Wt 211 lb 8 oz (95.9 kg)   SpO2 96%   BMI 29.50 kg/m   BP Readings from Last 3 Encounters:  09/07/16 108/60  07/13/16 122/76    07/06/16 (!) 112/58    Wt Readings from Last 3 Encounters:  09/07/16 211 lb 8 oz (95.9 kg)  07/13/16 221 lb (100.2 kg)  07/06/16 211 lb 6 oz (95.9 kg)    Physical Exam  Constitutional: He is oriented to person, place, and time. No distress.  HENT:  Mouth/Throat: Oropharynx is clear and moist. No oropharyngeal exudate.  Eyes: Conjunctivae are normal. Right eye exhibits no discharge. Left eye exhibits no discharge. No scleral icterus.  Neck: Normal range of motion. Neck supple. No JVD present. No tracheal deviation present. No thyromegaly present.  Cardiovascular: Normal rate, regular rhythm, normal heart sounds and intact distal pulses.  Exam reveals no gallop and no friction rub.   No murmur heard. Pulmonary/Chest: Effort normal and breath sounds normal. No stridor. No respiratory distress. He has no wheezes. He has no rales. He exhibits no tenderness.  Abdominal: Soft. Bowel sounds are normal. He exhibits no distension and no mass. There is no tenderness. There is no rebound and no guarding. Hernia confirmed negative in the right inguinal area and confirmed negative in the left inguinal area.  Genitourinary: Rectum normal, prostate normal, testes normal and penis normal. Rectal exam shows no external hemorrhoid, no internal hemorrhoid, no fissure, no mass, no tenderness, anal tone normal and guaiac negative stool. Prostate is not enlarged and not tender. Right testis shows no mass, no swelling and no tenderness. Right testis is descended. Left testis shows no mass, no swelling and no tenderness. Left testis is descended. Circumcised. No penile erythema or penile tenderness. No discharge found.  Musculoskeletal: Normal range of motion. He exhibits no edema, tenderness or deformity.  Lymphadenopathy:    He has no cervical adenopathy.       Right: No inguinal adenopathy present.       Left: No inguinal adenopathy present.  Neurological: He is oriented to person, place, and time.  Skin: Skin  is warm and dry. No rash noted. He is not diaphoretic. No erythema. No pallor.  Psychiatric: He has a normal mood and affect. His behavior is normal. Judgment and thought content normal.  Vitals reviewed.   Lab Results  Component Value Date   WBC 6.9 07/06/2016   HGB 13.7 07/06/2016   HCT 40.2 07/06/2016   PLT 108.0 (L) 07/06/2016   GLUCOSE 137 (H) 09/07/2016   CHOL 115 02/19/2016   TRIG 58.0 02/19/2016   HDL 44.60 02/19/2016   LDLDIRECT 51.0 04/07/2015   LDLCALC 59 02/19/2016   ALT 18 09/07/2016   AST 25 09/07/2016   NA 140 09/07/2016   K 4.1 09/07/2016   CL 105 09/07/2016   CREATININE 1.30 09/07/2016   BUN 17 09/07/2016   CO2 29 09/07/2016   TSH 0.16 (L) 09/07/2016   PSA 0.08 (L) 09/07/2016   INR 1.39 12/19/2015   HGBA1C 5.8 09/07/2016    Mr  Brain Wo Contrast  Result Date: 07/15/2016 CLINICAL DATA:  Ataxia, prior CVA. Confusion, generalized weakness, and BILATERAL hearing loss. Symptoms for 1-2 weeks. EXAM: MRI HEAD WITHOUT CONTRAST TECHNIQUE: Multiplanar, multiecho pulse sequences of the brain and surrounding structures were obtained without intravenous contrast. COMPARISON:  CT head 02/01/2016.  MR head 03/26/2011. FINDINGS: Brain: No evidence for acute infarction, hemorrhage, mass lesion, hydrocephalus, or extra-axial fluid. Premature for age cerebral and cerebellar atrophy. Moderate to advanced T2 and FLAIR hyperintensities throughout the periventricular and subcortical white matter, likely chronic microvascular ischemic change. Large chronic remote lacunar infarct LEFT thalamus. Moderate-sized chronic lacunar infarct RIGHT basal ganglia. Prominent perivascular spaces. Vascular: Flow voids are maintained throughout the carotid, basilar, and vertebral arteries. There are no areas of chronic hemorrhage. Skull and upper cervical spine: Unremarkable visualized calvarium, skullbase, and cervical vertebrae. Pituitary, pineal, cerebellar tonsils unremarkable. In the upper cervical  canal there is a soft tissue density posterior to C4, ventral epidural space, which is incompletely evaluated, potentially representing a C4-5 disc extrusion. Cord is displaced posteriorly. Consider MRI cervical spine for further evaluation if there are signs and symptoms of myelopathy. Sinuses/Orbits: No orbital masses or proptosis. Globes appear symmetric. Sinuses appear well aerated, without evidence for air-fluid level. Other: No nasopharyngeal pathology or mastoid fluid. Scalp and other visualized extracranial soft tissues grossly unremarkable. Compared with the brain MR from 2012, there is progression of atrophy and small vessel disease. IMPRESSION: Foci of chronic lacunar infarction, without evidence for acute cerebral ischemia. Premature for age atrophy with chronic microvascular ischemic change of a moderately advanced nature progressive from 2012 MR. Soft tissue density posterior to the C4 vertebral body in the ventral epidural space could represent a C4-5 disc extrusion. Consider MRI cervical spine for further evaluation. Electronically Signed   By: Staci Righter M.D.   On: 07/15/2016 09:03    Assessment & Plan:   Logan was seen today for hypertension, annual exam and hypothyroidism.  Diagnoses and all orders for this visit:  Essential hypertension- his blood pressures adequately well-controlled, lites and renal function are stable -     Comprehensive metabolic panel; Future  Other specified hypothyroidism- his TSH is suppressed so I have decreased his levothyroxine dose -     TSH; Future -     levothyroxine (SYNTHROID, LEVOTHROID) 150 MCG tablet; Take 1 tablet (150 mcg total) by mouth daily.  Chronic renal disease, stage 3, moderately decreased glomerular filtration rate (GFR)- his renal function has improved since he stopped taking the ARB, I reminded him not to take losartan anymore and he agrees to avoid nephrotoxic agents between 30-59 mL/min/1.73 square meter -     Comprehensive  metabolic panel; Future  Hyperglycemia- he is very mildly prediabetic, no medications are needed to treat this. -     Comprehensive metabolic panel; Future -     Hemoglobin A1c; Future  Routine general medical examination at a health care facility- exam completed, labs ordered and reviewed, his colonoscopy is up-to-date, vaccines reviewed, patient education material was given. -     PSA; Future  Need for prophylactic vaccination against Streptococcus pneumoniae (pneumococcus) -     Pneumococcal polysaccharide vaccine 23-valent greater than or equal to 2yo subcutaneous/IM   I have discontinued Mr. Mehrotra Cholecalciferol, triamcinolone cream, levothyroxine, and losartan. I am also having him start on levothyroxine. Additionally, I am having him maintain his aspirin, sildenafil, atorvastatin, thiamine, metoprolol succinate, albuterol, zolpidem, Vitamin D (Ergocalciferol), STIOLTO RESPIMAT, cyanocobalamin, lactulose, spironolactone, indapamide, risperiDONE, pantoprazole, and [facility] ACID CONTROLLER  MAX ST.  Meds ordered this encounter  Medications  . levothyroxine (SYNTHROID, LEVOTHROID) 150 MCG tablet    Sig: Take 1 tablet (150 mcg total) by mouth daily.    Dispense:  90 tablet    Refill:  1     Follow-up: Return in about 6 months (around 03/07/2017).  Scarlette Calico, MD

## 2016-09-07 NOTE — Patient Instructions (Signed)

## 2016-09-07 NOTE — Progress Notes (Signed)
Pre visit review using our clinic review tool, if applicable. No additional management support is needed unless otherwise documented below in the visit note. 

## 2016-09-17 ENCOUNTER — Other Ambulatory Visit: Payer: Self-pay | Admitting: Internal Medicine

## 2016-09-17 NOTE — Telephone Encounter (Signed)
erx sent

## 2016-09-20 ENCOUNTER — Other Ambulatory Visit: Payer: Self-pay | Admitting: Internal Medicine

## 2016-09-20 NOTE — Telephone Encounter (Signed)
Faxed to pharmacy requested.

## 2016-10-08 ENCOUNTER — Other Ambulatory Visit: Payer: Self-pay | Admitting: Internal Medicine

## 2016-11-05 ENCOUNTER — Other Ambulatory Visit: Payer: Self-pay | Admitting: Internal Medicine

## 2016-11-16 ENCOUNTER — Other Ambulatory Visit: Payer: Self-pay | Admitting: Internal Medicine

## 2016-11-16 DIAGNOSIS — E559 Vitamin D deficiency, unspecified: Secondary | ICD-10-CM

## 2016-12-18 ENCOUNTER — Other Ambulatory Visit: Payer: Self-pay | Admitting: Internal Medicine

## 2016-12-18 DIAGNOSIS — E038 Other specified hypothyroidism: Secondary | ICD-10-CM

## 2016-12-31 ENCOUNTER — Other Ambulatory Visit: Payer: Self-pay | Admitting: Internal Medicine

## 2017-01-03 ENCOUNTER — Telehealth: Payer: Self-pay

## 2017-01-03 NOTE — Telephone Encounter (Signed)
AMBIEN SENT TO [facility]

## 2017-01-09 ENCOUNTER — Other Ambulatory Visit: Payer: Self-pay | Admitting: Internal Medicine

## 2017-01-09 DIAGNOSIS — R600 Localized edema: Secondary | ICD-10-CM

## 2017-01-17 ENCOUNTER — Other Ambulatory Visit: Payer: Self-pay | Admitting: Internal Medicine

## 2017-01-17 DIAGNOSIS — R600 Localized edema: Secondary | ICD-10-CM

## 2017-03-24 IMAGING — CT CT ABD-PELV W/ CM
2 of 5 series · 16 of 46 positions shown, 18 images · IV contrast (omnipaque)
Comparison: Ultrasound dated 10/29/2015

CLINICAL DATA: Abdominal pain and shortness of breath. Abnormal
gallbladder on ultrasound exam. Ascites.

EXAM:
CT ABDOMEN AND PELVIS WITH CONTRAST
TECHNIQUE: Multidetector CT imaging of the abdomen and pelvis was performed
using the standard protocol following bolus administration of
intravenous contrast.
CONTRAST:  100mL OMNIPAQUE IOHEXOL 300 MG/ML  SOLN

[Series 2: abd/pel with · axial · 0.88mm/px · z∈[+1112,+1537]mm · 13 of 97 slices shown, 15 images]
[im 6/97  soft-tissue]
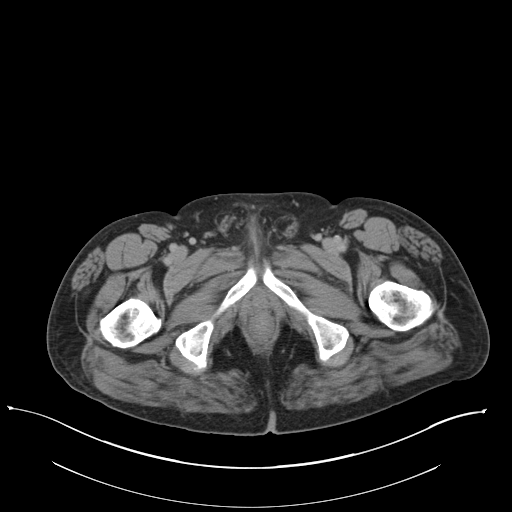
[im 6/97  bone]
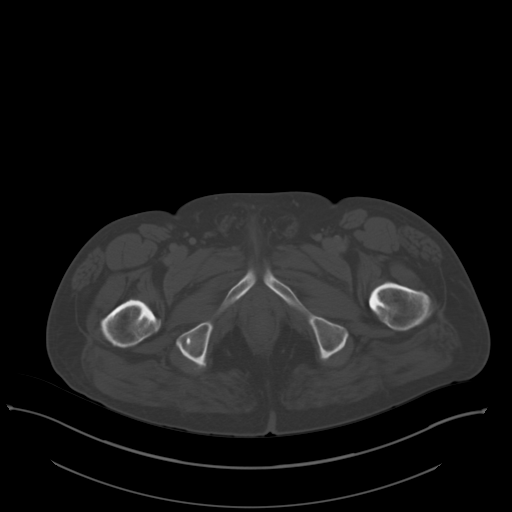
[im 16/97  soft-tissue]
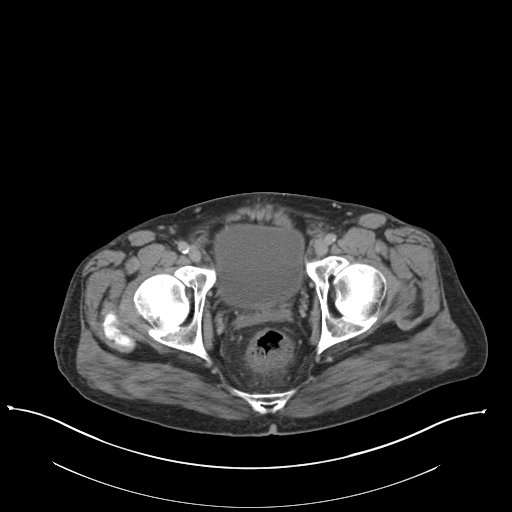
[im 21/97  soft-tissue]
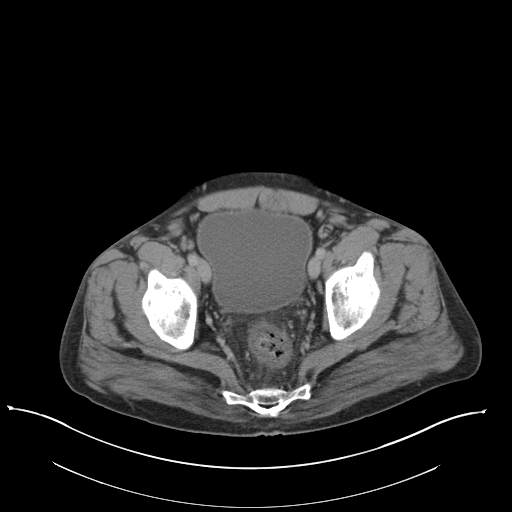
[im 26/97  soft-tissue]
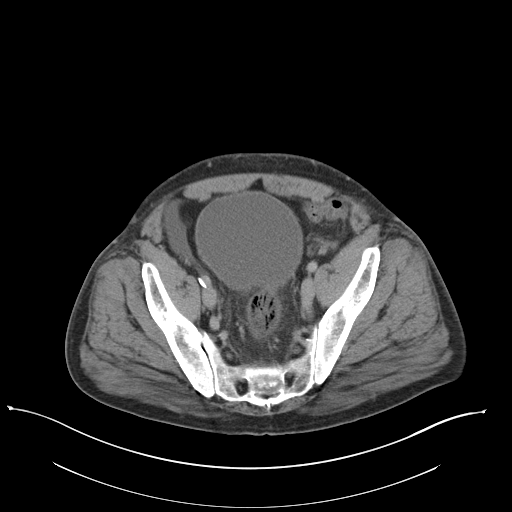
[im 36/97  soft-tissue]
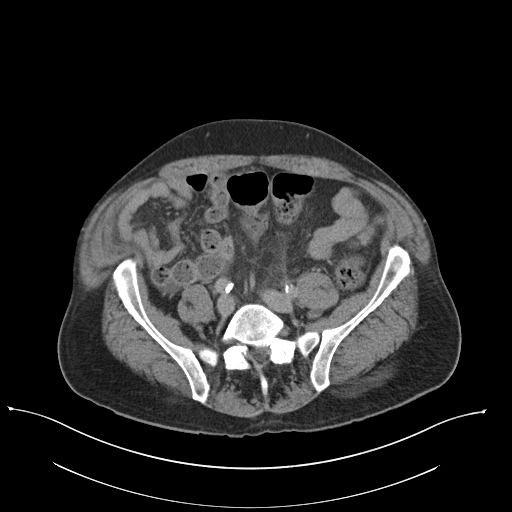
[im 41/97  soft-tissue]
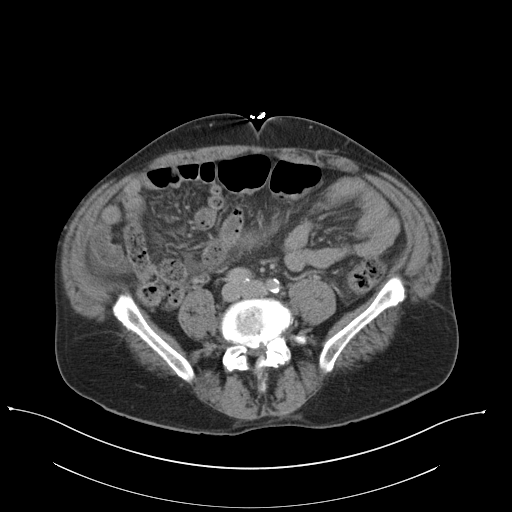
[im 51/97  soft-tissue]
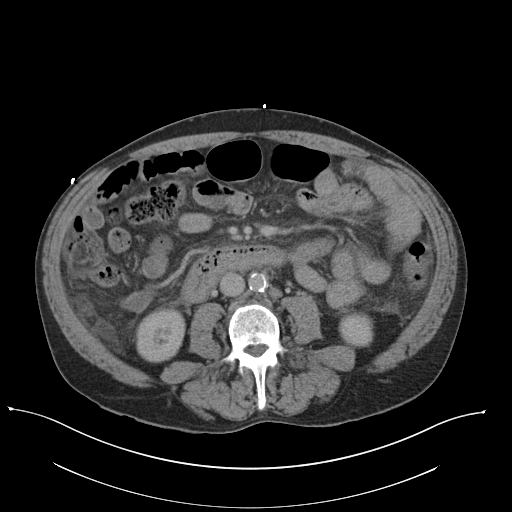
[im 56/97  soft-tissue]
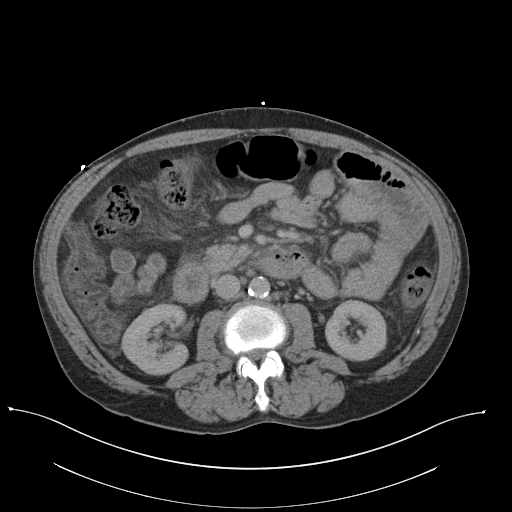
[im 61/97  soft-tissue]
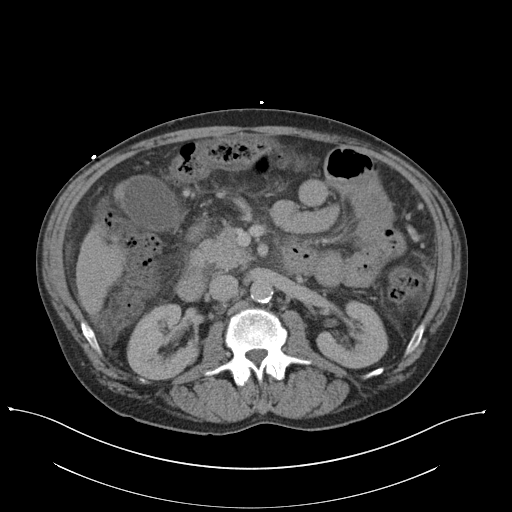
[im 61/97  bone]
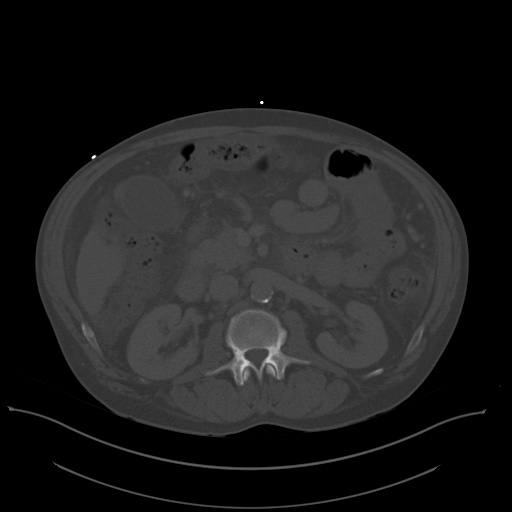
[im 71/97  soft-tissue]
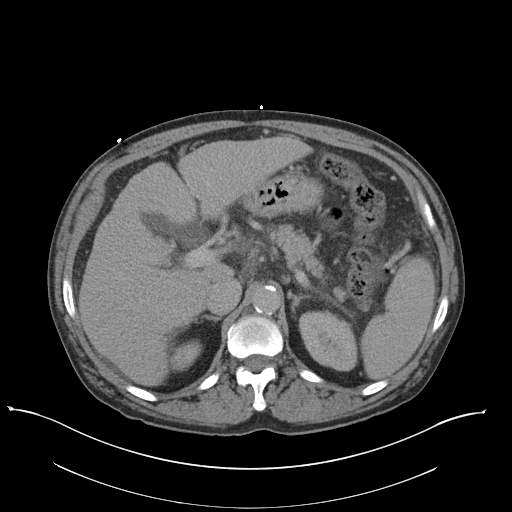
[im 76/97  soft-tissue]
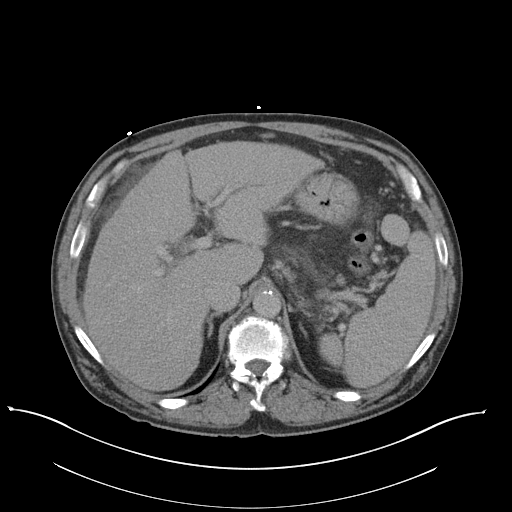
[im 81/97  soft-tissue]
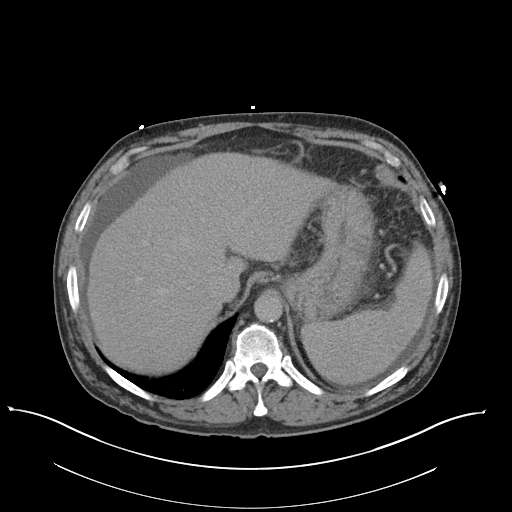
[im 91/97  soft-tissue]
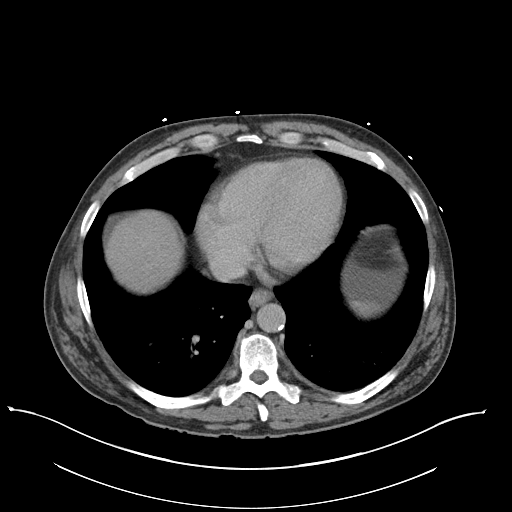

[Series 3: coronal a/|p · coronal · 0.80mm/px · 3 of 100 slices shown]
[im 34/100  soft-tissue]
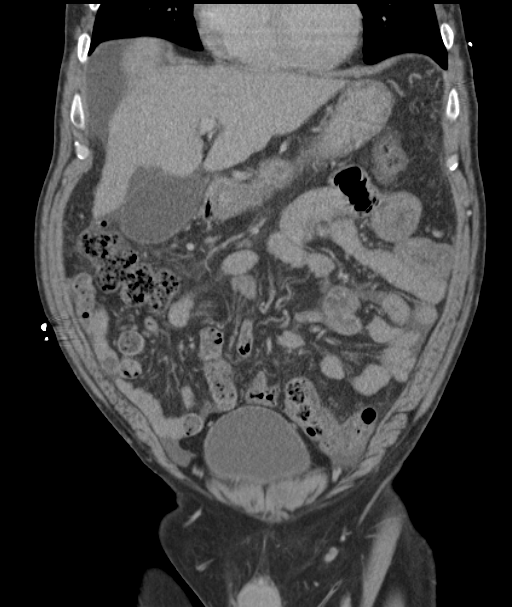
[im 45/100  soft-tissue]
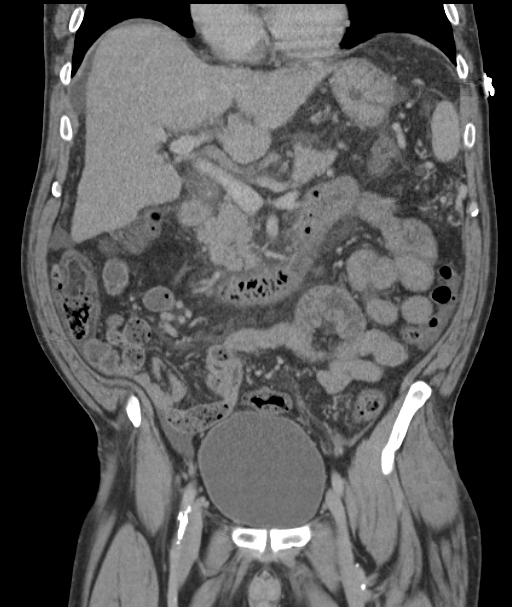
[im 56/100  soft-tissue]
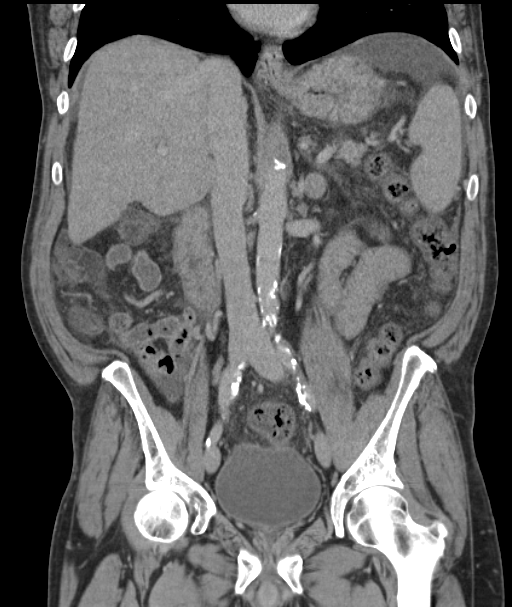

[16 of 46 positions shown; findings below may reference images not displayed]

FINDINGS: Lower chest:  Normal.

Hepatobiliary: There is nodularity of the liver parenchyma
prominence of the caudate lobe. This is consistent with cirrhosis.

There are multiple small stones in the gallbladder. The gallbladder
wall is thickened. There is an enhancing 16 mm nodule at the tip of
the gallbladder worrisome for carcinoma. There is an adjacent
calcification in the wall of the gallbladder.

There are no dilated bile ducts.

Pancreas: Pancreatic parenchyma is normal. Slight haziness in the
peripancreatic fat suggests mild pancreatitis.

Spleen: The spleen is at the upper limits of normal. There multiple
varices in the left upper quadrant which extend around the
gastroesophageal junction. This is suggestive of portal
hypertension.

Adrenals/Urinary Tract: 21 mm low-density lesion on the left adrenal
gland consistent with a benign adrenal adenoma. Kidneys, ureters,
and bladder are normal.

Stomach/Bowel: There is slight mucosal edema in the rectum and in
the cecum and ascending colon. The appendix and small bowel are
normal.

Vascular/Lymphatic: Extensive calcification in the abdominal aorta
and iliac arteries.

Reproductive: Normal.

Other: Ascites primarily around the liver and spleen. Soft tissue
edema in the mesenteries.

Musculoskeletal: No acute abnormality. Degenerative disc disease in
the lower lumbar spine.
IMPRESSION: 1. Cirrhosis with ascites.
2. Cholelithiasis.
3. Focal enhancing nodule at the tip of the gallbladder worrisome
for carcinoma.
4. Upper abdominal varices consistent with portal hypertension.
5. Aortic atherosclerosis.

## 2017-03-27 ENCOUNTER — Other Ambulatory Visit: Payer: Self-pay | Admitting: Internal Medicine

## 2017-04-07 ENCOUNTER — Telehealth: Payer: Self-pay

## 2017-04-07 MED ORDER — ATORVASTATIN CALCIUM 20 MG PO TABS: 20.0000 mg | ORAL_TABLET | Freq: Every day | ORAL | 1 refills | Status: AC

## 2017-04-07 NOTE — Telephone Encounter (Signed)
[facility] at[facility] 20 mg. erx sent for 90 days and 1 refills.   Refill request for resperidone. Refill denied. Last filled on 10/2016 for #90 and 3 refills.

## 2017-10-22 ENCOUNTER — Other Ambulatory Visit: Payer: Self-pay | Admitting: Internal Medicine

## 2017-10-22 DIAGNOSIS — R600 Localized edema: Secondary | ICD-10-CM

## 2017-11-02 ENCOUNTER — Other Ambulatory Visit: Payer: Self-pay | Admitting: Internal Medicine

## 2017-11-02 DIAGNOSIS — R600 Localized edema: Secondary | ICD-10-CM

## 2018-03-21 ENCOUNTER — Encounter: Payer: Self-pay | Admitting: Gastroenterology

## 2018-06-10 ENCOUNTER — Emergency Department (HOSPITAL_COMMUNITY)
Admission: EM | Admit: 2018-06-10 | Discharge: 2018-06-10 | Disposition: A | Discharge status: Home / Self Care | Payer: Medicaid Other | Attending: Emergency Medicine | Admitting: Emergency Medicine

## 2018-06-10 ENCOUNTER — Encounter (HOSPITAL_COMMUNITY): Payer: Self-pay | Admitting: Emergency Medicine

## 2018-06-10 DIAGNOSIS — N183 Chronic kidney disease, stage 3 (moderate): Secondary | ICD-10-CM | POA: Diagnosis not present

## 2018-06-10 DIAGNOSIS — Y929 Unspecified place or not applicable: Secondary | ICD-10-CM | POA: Diagnosis not present

## 2018-06-10 DIAGNOSIS — S80861A Insect bite (nonvenomous), right lower leg, initial encounter: Secondary | ICD-10-CM | POA: Diagnosis not present

## 2018-06-10 DIAGNOSIS — W57XXXA Bitten or stung by nonvenomous insect and other nonvenomous arthropods, initial encounter: Secondary | ICD-10-CM | POA: Diagnosis not present

## 2018-06-10 DIAGNOSIS — Z7982 Long term (current) use of aspirin: Secondary | ICD-10-CM | POA: Diagnosis not present

## 2018-06-10 DIAGNOSIS — S80862A Insect bite (nonvenomous), left lower leg, initial encounter: Secondary | ICD-10-CM | POA: Insufficient documentation

## 2018-06-10 DIAGNOSIS — E039 Hypothyroidism, unspecified: Secondary | ICD-10-CM | POA: Insufficient documentation

## 2018-06-10 DIAGNOSIS — Y999 Unspecified external cause status: Secondary | ICD-10-CM | POA: Insufficient documentation

## 2018-06-10 DIAGNOSIS — Z79899 Other long term (current) drug therapy: Secondary | ICD-10-CM | POA: Diagnosis not present

## 2018-06-10 DIAGNOSIS — I129 Hypertensive chronic kidney disease with stage 1 through stage 4 chronic kidney disease, or unspecified chronic kidney disease: Secondary | ICD-10-CM | POA: Diagnosis not present

## 2018-06-10 DIAGNOSIS — F17210 Nicotine dependence, cigarettes, uncomplicated: Secondary | ICD-10-CM | POA: Diagnosis not present

## 2018-06-10 DIAGNOSIS — J449 Chronic obstructive pulmonary disease, unspecified: Secondary | ICD-10-CM | POA: Diagnosis not present

## 2018-06-10 DIAGNOSIS — Y939 Activity, unspecified: Secondary | ICD-10-CM | POA: Diagnosis not present

## 2018-06-10 MED ORDER — DOXYCYCLINE HYCLATE 100 MG PO CAPS: 100.0000 mg | ORAL_CAPSULE | Freq: Two times a day (BID) | ORAL | 0 refills | Status: AC

## 2018-06-10 MED ORDER — HYDROCORTISONE 1 % EX CREA: TOPICAL_CREAM | CUTANEOUS | 0 refills | Status: AC

## 2018-06-10 MED ORDER — PERMETHRIN 5 % EX CREA
TOPICAL_CREAM | CUTANEOUS | 0 refills | Status: AC
Start: 2018-06-10 — End: ?

## 2018-06-10 NOTE — ED Provider Notes (Signed)
[facility] EMERGENCY DEPARTMENT Provider Note   CSN: [id number] Arrival date & time: 06/10/18  0750     History   Chief Complaint Chief Complaint  Patient presents with  . Insect Bite    HPI Jon Boyd is a 57 y.o. male.  HPI Jon Boyd is a 57 y.o. male presents to emergency department with complaint of rash to bilateral legs.  Patient reports seeing bumps to both of his legs over the last several days.  He states he feels like there is bugs inside the bumps.  He has been picking at the bumps trying to take out the bugs.  He states he has found some small black bugs that look like ticks on him.  He has been scratching because the bumps are itchy.  He denies any pain.  He denies any fever or chills.  No headache.  Rash is mainly to his legs and bilateral groin.  He denies any rash to his upper extremities, no rash to his face, neck, trunk, oral mucosa.  He states he has been walking his dog outside but otherwise has not done any yard work.  He lives with his mother who does not have a rash.  Denies any recent travel.  Past Medical History:  Diagnosis Date  . Chronic headaches   . Edema 06/03/2014  . Hyperlipidemia   . Hypertension   . Postablative hypothyroidism   . Stroke [facility])     Patient Active Problem List   Diagnosis Date Noted  . Routine general medical examination at a health care facility 09/07/2016  . Chronic renal disease, stage 3, moderately decreased glomerular filtration rate (GFR) between 30-59 mL/min/1.73 square meter (HCC) 07/07/2016  . Constipation 07/06/2016  . Stasis dermatitis of both legs 04/26/2016  . COPD GOLD 0 04/07/2016  . Insomnia 03/18/2016  . Hypertriglyceridemia 02/19/2016  . Alcohol dependence in remission ([place]) 02/19/2016  . Iron deficiency anemia 02/19/2016  . 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 Vitamin D deficiency 02/19/2016  . Chronic cholecystitis with calculus  12/24/2015  . Encephalopathy, hepatic ([place]) 11/05/2015  . Hyperglycemia 04/07/2015  . Tremor of both hands 04/07/2015  . Multifactorial peripheral neuropathy ([place]) 06/03/2014  . Hypothyroidism 06/30/2012  . Hepatic cirrhosis ([place]) 07/07/2011  . Stroke ([place])   . Hypertension 06/14/2011  . Dyslipidemia 06/14/2011  . Cigarette smoker 06/14/2011    Past Surgical History:  Procedure Laterality Date  . ANKLE SURGERY    . Back surgery    . CHOLECYSTECTOMY N/A 12/24/2015   Procedure: LAPAROSCOPIC CHOLECYSTECTOMY ;  Surgeon: Arta Bruce Kinsinger, MD;  Location: [facility] ORS;  Service: General;  Laterality: N/A;  . COLONOSCOPY    . FINGER SURGERY  09/2012   gramig  . LYMPH NODE DISSECTION N/A 12/24/2015   Procedure: POSSIBLE LYMPHADENECTOMY;  Surgeon: Arta Bruce Kinsinger, MD;  Location: [facility] ORS;  Service: General;  Laterality: N/A;  . POLYPECTOMY          Home Medications    Prior to Admission medications   Medication Sig Start Date End Date Taking? Authorizing Provider  albuterol (PROVENTIL HFA;VENTOLIN HFA) 108 (90 Base) MCG/ACT inhaler Inhale 2 puffs into the lungs every 6 (six) hours as needed for wheezing or shortness of breath. Patient not taking: Reported on 09/07/2016 03/18/16   Biagio Borg, MD  aspirin 81 MG chewable tablet Chew 81 mg by mouth daily.      [provider]  atorvastatin (LIPITOR) 20 MG  tablet Take 1 tablet (20 mg total) by mouth daily at 6 PM. 04/07/17   Janith Lima, MD  Cholecalciferol (VITAMIN D3) 50000 units CAPS TAKE 1 BY MOUTH ONCE A WEEK. 11/17/16   Janith Lima, MD  [facility] ACID CONTROLLER MAX ST 20 MG tablet TAKE ONE AT BEDTIME 08/25/16   Tanda Rockers, MD  cyanocobalamin 2000 MCG tablet Take 1 tablet (2,000 mcg total) by mouth daily. 06/18/16   Janith Lima, MD  indapamide (LOZOL) 1.25 MG tablet TAKE 1 TABLET (1.25 MG TOTAL) BY MOUTH DAILY. 01/09/17   Janith Lima, MD  lactulose (CEPHULAC) 10 g packet Take 1 packet (10 g total) by mouth 3  (three) times daily. 07/06/16   Janith Lima, MD  levothyroxine (SYNTHROID, LEVOTHROID) 150 MCG tablet TAKE 1 TABLET EVERY DAY 12/18/16   Janith Lima, MD  metoprolol succinate (TOPROL-XL) 25 MG 24 hr tablet TAKE 1 TABLET (25 MG TOTAL) BY MOUTH DAILY. 03/29/17   Janith Lima, MD  pantoprazole (PROTONIX) 40 MG tablet TAKE 1 TABLET (40 MG TOTAL) BY MOUTH DAILY. TAKE 30-60 MIN BEFORE FIRST MEAL OF THE DAY 09/20/16   Janith Lima, MD  risperiDONE (RISPERDAL) 1 MG tablet TAKE 1 TABLET (1 MG TOTAL) BY MOUTH AT BEDTIME. 10/12/16   Janith Lima, MD  sildenafil (VIAGRA) 100 MG tablet Take 0.5-1 tablets (50-100 mg total) by mouth daily as needed for erectile dysfunction. 10/02/14   Rowe Clack, MD  spironolactone (ALDACTONE) 100 MG tablet TAKE 1 TABLET (100 MG TOTAL) BY MOUTH DAILY. 01/17/17   Janith Lima, MD  STIOLTO RESPIMAT 2.5-2.5 MCG/ACT AERS INHALE 2 PUFFS INTO THE LUNGS DAILY. 04/07/16   [provider]  thiamine (VITAMIN B-1) 100 MG tablet Take 100 mg by mouth daily.    [provider]  Vitamin D, Ergocalciferol, (DRISDOL) 50000 units CAPS capsule Take 50,000 Units by mouth once a week. 05/18/16   [provider]  zolpidem (AMBIEN) 5 MG tablet TAKE 1 TABLET BY MOUTH AT BEDTIME AS NEEDED FOR SLEEP 01/02/17   Janith Lima, MD    Family History Family History  Problem Relation Age of Onset  . COPD Mother   . Tremor Mother   . Hyperthyroidism Sister   . Colon cancer Father 72  . Colon polyps Father     Social History Social History   Tobacco Use  . Smoking status: Current Every Day Smoker    Packs/day: 0.50    Years: 44.00    Pack years: 22.00    Types: Cigarettes  . Smokeless tobacco: Never Used  Substance Use Topics  . Alcohol use: Yes    Alcohol/week: 14.0 standard drinks    Types: 14 Cans of beer per week    Comment: dr has made him quit since 04-08-15,  pt admitted to drinking on 10/29/15  . Drug use: No     Allergies   Patient has  no known allergies.   Review of Systems Review of Systems  Constitutional: Negative for chills and fever.  Respiratory: Negative for cough, chest tightness and shortness of breath.   Cardiovascular: Negative for chest pain, palpitations and leg swelling.  Gastrointestinal: Negative for abdominal distention, abdominal pain, diarrhea, nausea and vomiting.  Musculoskeletal: Negative for arthralgias, myalgias, neck pain and neck stiffness.  Skin: Positive for rash.  Allergic/Immunologic: Negative for immunocompromised state.  Neurological: Negative for dizziness, weakness, light-headedness, numbness and headaches.     Physical Exam Updated Vital Signs  BP 129/87 (BP Location: Right Arm)   Pulse 73   Temp 98.7 F (37.1 C) (Oral)   Resp 16   SpO2 98%   Physical Exam  Constitutional: He appears well-developed and well-nourished. No distress.  Eyes: Conjunctivae are normal.  Neck: Neck supple.  Cardiovascular: Normal rate.  Pulmonary/Chest: No respiratory distress.  Abdominal: He exhibits no distension.  Skin: Skin is warm and dry.  Multiple bug bites to bilateral legs including feet and ankles, lower and upper legs, bilateral groin.  There is just few bites to the lower abdomen, otherwise no rash anywhere else on the body.  There is extensive excoriations over the bites resulting in open sores to the legs.  There are several areas of erythema around the sores.  No drainage.  Nursing note and vitals reviewed.    ED Treatments / Results  Labs (all labs ordered are listed, but only abnormal results are displayed) Labs Reviewed - No data to display  EKG None  Radiology No results found.  Procedures Procedures (including critical care time)  Medications Ordered in ED Medications - No data to display   Initial Impression / Assessment and Plan / ED Course  I have reviewed the triage vital signs and the nursing notes.  Pertinent labs & imaging results that were available  during my care of the patient were reviewed by me and considered in my medical decision making (see chart for details).     Shunt with multiple bites to bilateral legs, with now open sores from excoriations.  I discussed with patient not to scratch the sores.  He states he has been trying to scratch the bites to get the bugs out.  We did find a small tick on his sock, otherwise no definite insects found on his legs.  It is possible the patient has had multiple tick bites, however I suspect he may have insect bites that he just scratched versus possibly scabies.  Will treat with hydrocortisone cream topically, permethrin, and doxycycline to cover for possible multiple tick bites and for early cellulitis. Advised not to scratch and wear long pants and socks when going outside. Pt agreed. otehrwise no systemic symptoms, NAD, no fever, no headache, no myalgias.   Vitals:   06/10/18 0812  BP: 129/87  Pulse: 73  Resp: 16  Temp: 98.7 F (37.1 C)  TempSrc: Oral  SpO2: 98%     Final Clinical Impressions(s) / ED Diagnoses   Final diagnoses:  Insect bite, unspecified site, initial encounter    ED Discharge Orders         Ordered    permethrin (ELIMITE) 5 % cream     06/10/18 0822    doxycycline (VIBRAMYCIN) 100 MG capsule  2 times daily     06/10/18 0822    hydrocortisone cream 1 %     06/10/18 0822           Jeannett Senior, PA-C 06/10/18 1541    Lajean Saver, MD 06/11/18 [phone]

## 2018-06-10 NOTE — ED Triage Notes (Signed)
Pt reports insect bites on both legs that is causing pain and numbness, pt states that he has seen little black bugs that he has to dig out and thinks they may be ticks. Reports pain in legs occurs when he walks. Ambulatory with steady gait to ED stretcher.

## 2018-06-10 NOTE — ED Notes (Signed)
3 ticks found on patient's body, places in specimen cup and showed to PA

## 2018-06-10 NOTE — Discharge Instructions (Addendum)
Avoid scratching at your bites.  Apply hydrocortisone cream topically as needed for itching. Take doxycycline as prescribed until all gone for infection and for treatment of possible tick born illness. Permethrin as prescribed. Follow up with family doctor as needed.

## 2018-09-21 ENCOUNTER — Emergency Department (HOSPITAL_COMMUNITY)
Admission: EM | Admit: 2018-09-21 | Discharge: 2018-09-21 | Disposition: A | Discharge status: Home / Self Care | Payer: PRIVATE HEALTH INSURANCE | Attending: Emergency Medicine | Admitting: Emergency Medicine

## 2018-09-21 ENCOUNTER — Encounter (HOSPITAL_COMMUNITY): Payer: Self-pay

## 2018-09-21 ENCOUNTER — Other Ambulatory Visit: Payer: Self-pay

## 2018-09-21 DIAGNOSIS — I129 Hypertensive chronic kidney disease with stage 1 through stage 4 chronic kidney disease, or unspecified chronic kidney disease: Secondary | ICD-10-CM | POA: Insufficient documentation

## 2018-09-21 DIAGNOSIS — Z8673 Personal history of transient ischemic attack (TIA), and cerebral infarction without residual deficits: Secondary | ICD-10-CM | POA: Insufficient documentation

## 2018-09-21 DIAGNOSIS — Z7982 Long term (current) use of aspirin: Secondary | ICD-10-CM | POA: Insufficient documentation

## 2018-09-21 DIAGNOSIS — J449 Chronic obstructive pulmonary disease, unspecified: Secondary | ICD-10-CM | POA: Insufficient documentation

## 2018-09-21 DIAGNOSIS — K625 Hemorrhage of anus and rectum: Secondary | ICD-10-CM | POA: Insufficient documentation

## 2018-09-21 DIAGNOSIS — F1027 Alcohol dependence with alcohol-induced persisting dementia: Secondary | ICD-10-CM | POA: Insufficient documentation

## 2018-09-21 DIAGNOSIS — F17210 Nicotine dependence, cigarettes, uncomplicated: Secondary | ICD-10-CM | POA: Insufficient documentation

## 2018-09-21 DIAGNOSIS — I959 Hypotension, unspecified: Secondary | ICD-10-CM | POA: Insufficient documentation

## 2018-09-21 DIAGNOSIS — E039 Hypothyroidism, unspecified: Secondary | ICD-10-CM | POA: Insufficient documentation

## 2018-09-21 DIAGNOSIS — E785 Hyperlipidemia, unspecified: Secondary | ICD-10-CM | POA: Diagnosis not present

## 2018-09-21 DIAGNOSIS — N183 Chronic kidney disease, stage 3 (moderate): Secondary | ICD-10-CM | POA: Insufficient documentation

## 2018-09-21 DIAGNOSIS — Z79899 Other long term (current) drug therapy: Secondary | ICD-10-CM | POA: Diagnosis not present

## 2018-09-21 LAB — COMPREHENSIVE METABOLIC PANEL
ALBUMIN: 3.2 g/dL — AB (ref 3.5–5.0)
ALT: 30 U/L (ref 0–44)
AST: 42 U/L — AB (ref 15–41)
Alkaline Phosphatase: 105 U/L (ref 38–126)
Anion gap: 5 (ref 5–15)
BUN: 13 mg/dL (ref 6–20)
CHLORIDE: 112 mmol/L — AB (ref 98–111)
CO2: 23 mmol/L (ref 22–32)
Calcium: 8.5 mg/dL — ABNORMAL LOW (ref 8.9–10.3)
Creatinine, Ser: 1.24 mg/dL (ref 0.61–1.24)
GFR calc Af Amer: >60 mL/min (ref >60)
GLUCOSE: 138 mg/dL — AB (ref 70–99)
Potassium: 3.9 mmol/L (ref 3.5–5.1)
Sodium: 140 mmol/L (ref 135–145)
Total Bilirubin: 1 mg/dL (ref 0.3–1.2)
Total Protein: 6.7 g/dL (ref 6.5–8.1)

## 2018-09-21 LAB — CBC
HEMATOCRIT: 34.6 % — AB (ref 39.0–52.0)
Hemoglobin: 11.5 g/dL — ABNORMAL LOW (ref 13.0–17.0)
MCH: 33.6 pg (ref 26.0–34.0)
MCHC: 33.2 g/dL (ref 30.0–36.0)
MCV: 101.2 fL — ABNORMAL HIGH (ref 80.0–100.0)
Platelets: 83 10*3/uL — ABNORMAL LOW (ref 150–400)
RBC: 3.42 MIL/uL — ABNORMAL LOW (ref 4.22–5.81)
RDW: 15.4 % (ref 11.5–15.5)

## 2018-09-21 LAB — POC OCCULT BLOOD, ED: Fecal Occult Bld: POSITIVE — AB

## 2018-09-21 LAB — TYPE AND SCREEN
ABO/RH(D): O POS
ANTIBODY SCREEN: NEGATIVE

## 2018-09-21 MED ORDER — SODIUM CHLORIDE 0.9 % IV BOLUS
1000.0000 mL | Freq: Once | INTRAVENOUS | Status: AC
  Administered 2018-09-21: 1000 mL via INTRAVENOUS

## 2018-09-21 NOTE — Discharge Instructions (Addendum)
I spoke to the gastroenterologist on-call for [facility] gastro[facility]ology on [address].  Call the office tomorrow and remind them that you were in the emergency department and that the 2 doctors spoke.  They will try to work you in sooner.  Return if worse.

## 2018-09-21 NOTE — ED Triage Notes (Signed)
Patient reports that he has had bright red rectal bleeding x 1 month and is daily. Patient states it is probably 1/2 cup or more each day.  Patient denies any abdominal pain and states he is not aware of any hemorrhoids.  Patient states he is suppose to schedule himself for a colonoscopy,but has not done so.

## 2018-09-21 NOTE — ED Provider Notes (Addendum)
[facility] DEPT Provider Note   CSN: [id number] Arrival date & time: 09/21/18  1712     History   Chief Complaint Chief Complaint  Patient presents with  . Rectal Bleeding  . Hypotension    HPI Jon Boyd is a 57 y.o. male.  Patient reports rectal bleeding for 1 month described as "half a cup per day".  He states multiple polyps in the past via colonoscopy.  He is scheduled to be seen by [facility] gastroenterology in approximately 1 month for this problem.  No syncope, chest pain, dyspnea.  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  Severity of symptoms is moderate.  Nothing makes symptoms better or worse.     Past Medical History:  Diagnosis Date  . Chronic headaches   . Edema 06/03/2014  . Hyperlipidemia   . Hypertension   . Postablative hypothyroidism   . Stroke [facility])     Patient Active Problem List   Diagnosis Date Noted  . Routine general medical examination at a health care facility 09/07/2016  . Chronic renal disease, stage 3, moderately decreased glomerular filtration rate (GFR) between 30-59 mL/min/1.73 square meter (HCC) 07/07/2016  . Constipation 07/06/2016  . Stasis dermatitis of both legs 04/26/2016  . COPD GOLD 0 04/07/2016  . Insomnia 03/18/2016  . Hypertriglyceridemia 02/19/2016  . Alcohol dependence in remission ([place]) 02/19/2016  . Iron deficiency anemia 02/19/2016  . 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 Vitamin D deficiency 02/19/2016  . Chronic cholecystitis with calculus 12/24/2015  . Encephalopathy, hepatic ([place]) 11/05/2015  . Hyperglycemia 04/07/2015  . Tremor of both hands 04/07/2015  . Multifactorial peripheral neuropathy ([place]) 06/03/2014  . Hypothyroidism 06/30/2012  . Hepatic cirrhosis ([place]) 07/07/2011  . Stroke ([place])   . Hypertension 06/14/2011  . Dyslipidemia  06/14/2011  . Cigarette smoker 06/14/2011    Past Surgical History:  Procedure Laterality Date  . ANKLE SURGERY    . Back surgery    . CHOLECYSTECTOMY N/A 12/24/2015   Procedure: LAPAROSCOPIC CHOLECYSTECTOMY ;  Surgeon: Arta Bruce Kinsinger, MD;  Location: [facility] ORS;  Service: General;  Laterality: N/A;  . COLONOSCOPY    . FINGER SURGERY  09/2012   gramig  . LYMPH NODE DISSECTION N/A 12/24/2015   Procedure: POSSIBLE LYMPHADENECTOMY;  Surgeon: Arta Bruce Kinsinger, MD;  Location: [facility] ORS;  Service: General;  Laterality: N/A;  . POLYPECTOMY          Home Medications    Prior to Admission medications   Medication Sig Start Date End Date Taking? Authorizing Provider  albuterol (PROVENTIL HFA;VENTOLIN HFA) 108 (90 Base) MCG/ACT inhaler Inhale 2 puffs into the lungs every 6 (six) hours as needed for wheezing or shortness of breath. 03/18/16  Yes Biagio Borg, MD  aspirin 81 MG chewable tablet Chew 81 mg by mouth daily.     Yes [provider]  atorvastatin (LIPITOR) 20 MG tablet Take 1 tablet (20 mg total) by mouth daily at 6 PM. Patient taking differently: Take 20 mg by mouth daily.  04/07/17  Yes Janith Lima, MD  gabapentin (NEURONTIN) 300 MG capsule Take 300 mg by mouth at bedtime. 09/21/18  Yes [provider]  hydrOXYzine (ATARAX/VISTARIL) 25 MG tablet Take 25 mg by mouth daily. 09/19/18  Yes [provider]  indapamide (LOZOL) 1.25 MG tablet TAKE 1 TABLET (1.25 MG TOTAL) BY MOUTH DAILY. 01/09/17  Yes Janith Lima, MD  KLOR-CON M10 10 MEQ tablet Take 10 mEq by mouth daily. 09/14/18  Yes [provider]  lactulose (CHRONULAC) 10 GM/15ML solution Take 15 mLs by mouth 2 (two) times daily. 09/05/18  Yes [provider]  levofloxacin (LEVAQUIN) 750 MG tablet Take 750 mg by mouth daily.   Yes [provider]  levothyroxine (SYNTHROID, LEVOTHROID) 175 MCG tablet Take 175 mcg by mouth daily. 07/21/18  Yes [provider]    metoprolol succinate (TOPROL-XL) 25 MG 24 hr tablet TAKE 1 TABLET (25 MG TOTAL) BY MOUTH DAILY. 03/29/17  Yes Janith Lima, MD  mirtazapine (REMERON) 15 MG tablet Take 15 mg by mouth at bedtime. 09/11/18  Yes [provider]  OLANZapine (ZYPREXA) 5 MG tablet Take 5 mg by mouth at bedtime. 09/11/18  Yes [provider]  omeprazole (PRILOSEC) 40 MG capsule Take 40 mg by mouth 2 (two) times daily. 09/15/18  Yes [provider]  sertraline (ZOLOFT) 100 MG tablet Take 200 mg by mouth daily. 09/12/18  Yes [provider]  sildenafil (VIAGRA) 100 MG tablet Take 0.5-1 tablets (50-100 mg total) by mouth daily as needed for erectile dysfunction. 10/02/14  Yes Rowe Clack, MD  spironolactone (ALDACTONE) 100 MG tablet TAKE 1 TABLET (100 MG TOTAL) BY MOUTH DAILY. 01/17/17  Yes Janith Lima, MD  STIOLTO RESPIMAT 2.5-2.5 MCG/ACT AERS Inhale 2 puffs into the lungs daily.  04/07/16  Yes [provider]  Cholecalciferol (VITAMIN D3) 50000 units CAPS TAKE 1 BY MOUTH ONCE A WEEK. Patient not taking: Reported on 09/21/2018 11/17/16   Janith Lima, MD  [employer] ACID CONTROLLER MAX ST 20 MG tablet TAKE ONE AT BEDTIME Patient not taking: Reported on 09/21/2018 08/25/16   Tanda Rockers, MD  cyanocobalamin 2000 MCG tablet Take 1 tablet (2,000 mcg total) by mouth daily. Patient not taking: Reported on 09/21/2018 06/18/16   Janith Lima, MD  doxycycline (VIBRAMYCIN) 100 MG capsule Take 1 capsule (100 mg total) by mouth 2 (two) times daily. Patient not taking: Reported on 09/21/2018 06/10/18   Jeannett Senior, PA-C  hydrocortisone cream 1 % Apply to affected area 2 times daily Patient not taking: Reported on 09/21/2018 06/10/18   Jeannett Senior, PA-C  lactulose (CEPHULAC) 10 g packet Take 1 packet (10 g total) by mouth 3 (three) times daily. Patient not taking: Reported on 09/21/2018 07/06/16   Janith Lima, MD  levothyroxine (SYNTHROID, LEVOTHROID) 150 MCG  tablet TAKE 1 TABLET EVERY DAY Patient not taking: Reported on 09/21/2018 12/18/16   Janith Lima, MD  pantoprazole (PROTONIX) 40 MG tablet TAKE 1 TABLET (40 MG TOTAL) BY MOUTH DAILY. TAKE 30-60 MIN BEFORE FIRST MEAL OF THE DAY Patient not taking: Reported on 09/21/2018 09/20/16   Janith Lima, MD  permethrin (ELIMITE) 5 % cream Apply neck down, wash off in 8 hrs. Repeat in 1 week Patient not taking: Reported on 09/21/2018 06/10/18   Jeannett Senior, PA-C  risperiDONE (RISPERDAL) 1 MG tablet TAKE 1 TABLET (1 MG TOTAL) BY MOUTH AT BEDTIME. Patient not taking: Reported on 09/21/2018 10/12/16   Janith Lima, MD  zolpidem (AMBIEN) 5 MG tablet TAKE 1 TABLET BY MOUTH AT BEDTIME AS NEEDED FOR SLEEP Patient not taking: Reported on 09/21/2018 01/02/17   Janith Lima, MD    Family History Family History  Problem Relation Age of Onset  . COPD Mother   . Tremor Mother   . Hyperthyroidism Sister   . Colon cancer Father 41  .  Colon polyps Father     Social History Social History   Tobacco Use  . Smoking status: Current Every Day Smoker    Packs/day: 0.25    Years: 44.00    Pack years: 11.00    Types: Cigarettes  . Smokeless tobacco: Never Used  Substance Use Topics  . Alcohol use: Not Currently    Alcohol/week: 14.0 standard drinks    Types: 14 Cans of beer per week    Comment: dr has made him quit since 04-08-15,  pt admitted to drinking on 10/29/15  . Drug use: No     Allergies   Patient has no known allergies.   Review of Systems Review of Systems   Physical Exam Updated Vital Signs BP 103/65   Pulse 74   Temp 98.7 F (37.1 C) (Oral)   Resp 18   Ht 5\' 11"  (1.803 m)   Wt 100.2 kg   SpO2 99%   BMI 30.82 kg/m   Physical Exam  Constitutional: He is oriented to person, place, and time.  nad  HENT:  Head: Normocephalic and atraumatic.  Eyes: Conjunctivae are normal.  Neck: Neck supple.  Cardiovascular: Normal rate and regular rhythm.  Pulmonary/Chest:  Effort normal and breath sounds normal.  Abdominal: Soft. Bowel sounds are normal.  Genitourinary:  Genitourinary Comments: Rectal exam: No masses, brown stool, heme positive  Musculoskeletal: Normal range of motion.  Neurological: He is alert and oriented to person, place, and time.  Skin: Skin is warm and dry.  Psychiatric: He has a normal mood and affect. His behavior is normal.  Nursing note and vitals reviewed.    ED Treatments / Results  Labs (all labs ordered are listed, but only abnormal results are displayed) Labs Reviewed  COMPREHENSIVE METABOLIC PANEL - Abnormal; Notable for the following components:      Result Value   Chloride 112 (*)    Glucose, Bld 138 (*)    Calcium 8.5 (*)    Albumin 3.2 (*)    AST 42 (*)    All other components within normal limits  CBC - Abnormal; Notable for the following components:   RBC 3.42 (*)    Hemoglobin 11.5 (*)    HCT 34.6 (*)    MCV 101.2 (*)    Platelets 83 (*)    All other components within normal limits  POC OCCULT BLOOD, ED - Abnormal; Notable for the following components:   Fecal Occult Bld POSITIVE (*)    All other components within normal limits  TYPE AND SCREEN    EKG None  Radiology No results found.  Procedures Procedures (including critical care time)  Medications Ordered in ED Medications  sodium chloride 0.9 % bolus 1,000 mL (0 mLs Intravenous Stopped 09/21/18 2213)     Initial Impression / Assessment and Plan / ED Course  I have reviewed the triage vital signs and the nursing notes.  Pertinent labs & imaging results that were available during my care of the patient were reviewed by me and considered in my medical decision making (see chart for details).     Patient is hemodynamically stable.  His hemoglobin has dropped to 11.5.  Pressure stable.  Close clinical scenario with gastroenterologist on-call for [facility] on [address] [facility].  Dr. agreed that this patient should be seen  sooner than later.  He will call the office in the morning for prompt follow-up.  Final Clinical Impressions(s) / ED Diagnoses   Final diagnoses:  Rectal bleeding  ED Discharge Orders    None       Nat Christen, MD 09/21/18 2222    Nat Christen, MD 09/21/18 2224

## 2019-08-29 ENCOUNTER — Encounter: Payer: Self-pay | Admitting: Neurology

## 2019-10-08 ENCOUNTER — Ambulatory Visit (INDEPENDENT_AMBULATORY_CARE_PROVIDER_SITE_OTHER): Payer: Medicare Other | Admitting: Neurology

## 2019-10-08 ENCOUNTER — Encounter: Payer: Self-pay | Admitting: Neurology

## 2019-10-08 ENCOUNTER — Other Ambulatory Visit: Payer: Self-pay

## 2019-10-08 VITALS — BP 128/77 | HR 99 | Ht 71.0 in | Wt 238.0 lb

## 2019-10-08 DIAGNOSIS — R2681 Unsteadiness on feet: Secondary | ICD-10-CM | POA: Diagnosis not present

## 2019-10-08 DIAGNOSIS — G621 Alcoholic polyneuropathy: Secondary | ICD-10-CM

## 2019-10-08 NOTE — Progress Notes (Signed)
[facility] Clinic Note - Initial Visit   Date: [birth year]  PRESCOTT MCCONNELL MRN: [id number] DOB: [birth year]   Dear Jon Hausen, FNP:  Thank you for your kind referral of Jon Boyd for consultation of bilateral leg weakness. Although his history is well known to you, please allow [place] to reiterate it for the purpose of our medical record. The patient was accompanied to the clinic by self.    History of Present Illness: Jon Boyd is a 58 y.o. right-handed male with COPD, alcoholic cirrhosis, CKD hypertension, hypothyroidism, diabetes mellitus, and anxiety,  presenting for second opinion of bilateral leg weakness and gait instability. His symptoms have been ongoing for several years.  He complains of intermittent shakiness of the legs and instability.  He has known alcohol-induced neuropathy. He has history of alcohol abuse, drinking (2) 12 pack nightly for 40 years.  He has been sober for the past few years.  He falls once every few weeks and usually is able to stand up.  He walks unassisted at home.  He lives alone in a [place] home.  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   He does not work and is on disability since 2016.    He has seen neurology in the past for these complaints who have attributed gait instability to neuropathy.  He has also been evaluated for tremor and memory loss.  Prior evaluation for memory indicates cognitive impairment due to inattention ([place] 27/30).  Medication management for tremor was limited due to cirrhosis and CKD.     He recently underwent MRI of the cervical, thoracic, and lumbar spine in November 2020.  Findings show multilevel degenerative changes with foraminal stenosis and lateral recess stenosis, notable at C4-5 and L2-L3.  Out-side paper records, electronic medical record, and images have been reviewed where available and summarized as:  Lab Results  Component Value Date   HGBA1C 5.8  09/07/2016   Lab Results  Component Value Date   [id number] 06/17/2016   Lab Results  Component Value Date   TSH 0.16 (L) 09/07/2016   No results found for: ESRSEDRATE, POCTSEDRATE  Past Medical History:  Diagnosis Date  . Chronic headaches   . Edema 06/03/2014  . Hyperlipidemia   . Hypertension   . Postablative hypothyroidism   . Stroke [facility])     Past Surgical History:  Procedure Laterality Date  . ANKLE SURGERY    . Back surgery    . CHOLECYSTECTOMY N/A 12/24/2015   Procedure: LAPAROSCOPIC CHOLECYSTECTOMY ;  Surgeon: Arta Bruce Kinsinger, MD;  Location: [facility]S;  Service: General;  Laterality: N/A;  . COLONOSCOPY    . FINGER SURGERY  09/2012   gramig  . LYMPH NODE DISSECTION N/A 12/24/2015   Procedure: POSSIBLE LYMPHADENECTOMY;  Surgeon: Arta Bruce Kinsinger, MD;  Location: [facility] ORS;  Service: General;  Laterality: N/A;  . POLYPECTOMY       Medications:  Outpatient Encounter Medications as of 10/08/2019  Medication Sig Note  . albuterol (PROVENTIL HFA;VENTOLIN HFA) 108 (90 Base) MCG/ACT inhaler Inhale 2 puffs into the lungs every 6 (six) hours as needed for wheezing or shortness of breath.   Marland Kitchen aspirin 81 MG chewable tablet Chew 81 mg by mouth daily.     Marland Kitchen atorvastatin (LIPITOR) 20 MG tablet Take 1 tablet (20 mg total) by mouth daily at 6 PM. (Patient taking differently: Take 20 mg by mouth daily. )   . Cholecalciferol (VITAMIN D3) 50000  units CAPS TAKE 1 BY MOUTH ONCE A WEEK.   . [employer] ACID CONTROLLER MAX ST 20 MG tablet TAKE ONE AT BEDTIME   . cyanocobalamin 2000 MCG tablet Take 1 tablet (2,000 mcg total) by mouth daily.   Marland Kitchen doxycycline (VIBRAMYCIN) 100 MG capsule Take 1 capsule (100 mg total) by mouth 2 (two) times daily.   Marland Kitchen gabapentin (NEURONTIN) 300 MG capsule Take 300 mg by mouth at bedtime.   . hydrocortisone cream 1 % Apply to affected area 2 times daily   . hydrOXYzine (ATARAX/VISTARIL) 25 MG tablet Take 25 mg by mouth daily.   . indapamide (LOZOL) 1.25 MG  tablet TAKE 1 TABLET (1.25 MG TOTAL) BY MOUTH DAILY.   Marland Kitchen KLOR-CON M10 10 MEQ tablet Take 10 mEq by mouth daily.   Marland Kitchen lactulose (CEPHULAC) 10 g packet Take 1 packet (10 g total) by mouth 3 (three) times daily.   Marland Kitchen lactulose (CHRONULAC) 10 GM/15ML solution Take 15 mLs by mouth 2 (two) times daily.   Marland Kitchen levofloxacin (LEVAQUIN) 750 MG tablet Take 750 mg by mouth daily. 09/21/2018: Started on 09/15/18 for a 10 day supply  . levothyroxine (SYNTHROID, LEVOTHROID) 150 MCG tablet TAKE 1 TABLET EVERY DAY   . levothyroxine (SYNTHROID, LEVOTHROID) 175 MCG tablet Take 175 mcg by mouth daily.   . metoprolol succinate (TOPROL-XL) 25 MG 24 hr tablet TAKE 1 TABLET (25 MG TOTAL) BY MOUTH DAILY.   . mirtazapine (REMERON) 15 MG tablet Take 15 mg by mouth at bedtime.   Marland Kitchen OLANZapine (ZYPREXA) 5 MG tablet Take 5 mg by mouth at bedtime.   Marland Kitchen omeprazole (PRILOSEC) 40 MG capsule Take 40 mg by mouth 2 (two) times daily.   . pantoprazole (PROTONIX) 40 MG tablet TAKE 1 TABLET (40 MG TOTAL) BY MOUTH DAILY. TAKE 30-60 MIN BEFORE FIRST MEAL OF THE DAY   . permethrin (ELIMITE) 5 % cream Apply neck down, wash off in 8 hrs. Repeat in 1 week   . risperiDONE (RISPERDAL) 1 MG tablet TAKE 1 TABLET (1 MG TOTAL) BY MOUTH AT BEDTIME.   Marland Kitchen sertraline (ZOLOFT) 100 MG tablet Take 200 mg by mouth daily.   . sildenafil (VIAGRA) 100 MG tablet Take 0.5-1 tablets (50-100 mg total) by mouth daily as needed for erectile dysfunction.   Marland Kitchen spironolactone (ALDACTONE) 100 MG tablet TAKE 1 TABLET (100 MG TOTAL) BY MOUTH DAILY.   Marland Kitchen STIOLTO RESPIMAT 2.5-2.5 MCG/ACT AERS Inhale 2 puffs into the lungs daily.    Marland Kitchen zolpidem (AMBIEN) 5 MG tablet TAKE 1 TABLET BY MOUTH AT BEDTIME AS NEEDED FOR SLEEP    No facility-administered encounter medications on file as of 10/08/2019.     Allergies: No Known Allergies  Family History: Family History  Problem Relation Age of Onset  . COPD Mother   . Tremor Mother   . Hyperthyroidism Sister   . Colon cancer Father 38   . Colon polyps Father     Social History: Social History   Tobacco Use  . Smoking status: Current Every Day Smoker    Packs/day: 0.25    Years: 44.00    Pack years: 11.00    Types: Cigarettes  . Smokeless tobacco: Never Used  Substance Use Topics  . Alcohol use: Not Currently    Alcohol/week: 14.0 standard drinks    Types: 14 Cans of beer per week    Comment: dr has made him quit since 04-08-15,  pt admitted to drinking on 10/29/15  . Drug use: No   Social  History   Social History Narrative   Right handed   One story home   2 daughters    Review of Systems:  CONSTITUTIONAL: No fevers, chills, night sweats, or weight loss.   EYES: No visual changes or eye pain ENT: No hearing changes.  No history of nose bleeds.   RESPIRATORY: No cough, wheezing and shortness of breath.   CARDIOVASCULAR: Negative for chest pain, and palpitations.   GI: +for abdominal discomfort, blood in stools or black stools.  No recent change in bowel habits.   GU:  No history of incontinence.   MUSCLOSKELETAL: No history of joint pain or swelling.  No myalgias.   SKIN: Negative for lesions, rash, and itching.   HEMATOLOGY/ONCOLOGY: Negative for prolonged bleeding, bruising easily, and swollen nodes.  No history of cancer.   ENDOCRINE: Negative for cold or heat intolerance, polydipsia or goiter.   PSYCH:  +depression or anxiety symptoms.   NEURO: As Above.   Vital Signs:  BP 128/77   Pulse 99   Ht 5\' 11"  (1.803 m)   Wt 238 lb (108 kg)   SpO2 95%   BMI 33.19 kg/m    General Medical Exam:   General:  Poorly groomed, arrived in wheelchair.   Eyes/ENT: see cranial nerve examination.   Neck:   No carotid bruits. Respiratory:  Clear to auscultation, good air entry bilaterally.   Cardiac:  Regular rate and rhythm, no murmur.   Extremities:  No deformities, edema, or skin discoloration.  Skin:  No rashes or lesions.  Neurological Exam: MENTAL STATUS including orientation to time, place, person,  recent and remote memory, attention span and concentration, language, and fund of knowledge is normal.  Speech is not dysarthric.  CRANIAL NERVES: II:  No visual field defects. III-IV-VI: Pupils equal round and reactive to light.  Normal conjugate, extra-ocular eye movements in all directions of gaze.  No nystagmus.  No ptosis.   VIII:  Normal hearing and vestibular function.   IX-X:  Normal palatal movement.   XI:  Normal shoulder shrug and head rotation.    MOTOR:  Mild loss of muscle bulk in the lower legs bilaterally.  No fasciculations.  Postural tremor when hand out stretched.  No pronator drift.   Upper Extremity:  Right  Left  Deltoid  5/5   5/5   Biceps  5/5   5/5   Triceps  5/5   5/5   Infraspinatus 5/5  5/5  Medial pectoralis 5/5  5/5  Wrist extensors  5/5   5/5   Wrist flexors  5/5   5/5   Finger extensors  5/5   5/5   Finger flexors  5/5   5/5   Dorsal interossei  5/5   5/5   Abductor pollicis  5/5   5/5   Tone (Ashworth scale)  0  0   Lower Extremity:  Right  Left  Hip flexors  5/5   5/5   Hip extensors  5/5   5/5   Adductor 5/5  5/5  Abductor 5/5  5/5  Knee flexors  5/5   5/5   Knee extensors  5/5   5/5   Dorsiflexors  5/5   5/5   Plantarflexors  5/5   5/5   Toe extensors  5-/5   5-/5   Toe flexors  5-/5   5-/5   Tone (Ashworth scale)  0  0   MSRs:  Right        Left  brachioradialis 2+  2+  biceps 2+  2+  triceps 2+  2+  patellar tr  tr  ankle jerk 0  0  Hoffman no  no  plantar response down  down   SENSORY:  Vibration, temperature, and pin prick reduced distal to mid-calf bilaterally, worse distally.  Sensation intact in the arms.  Rhomgerg testing positive.   COORDINATION/GAIT: Normal finger-to- nose-finger.  Intact rapid alternating movements bilaterally.  Unable to rise from a chair without using arms.  Gait wide-based, mildly unsteady, unassisted.   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  His exam shows preserved motor strength proximally and therefore it less likely thiat his foraminal stenosis at L2-3 is contributing to symptoms.    - Patient informed that there is no way to "fix" his nerves and the injury already done to the nerves is not reversible, therefore management is supportive  - Fall prevention is very important to minimize risk of complications  - Refer to home PT for gait training  - Always use a cane/walker  - Patient educated on daily foot inspection, fall prevention, and safety precautions around the home.   Thank you for allowing me to participate in patient's care.  If I can answer any additional questions, I would be pleased to do so.    Sincerely,    Ugo Thoma K. Posey Pronto, DO

## 2019-10-08 NOTE — Patient Instructions (Signed)
Referral to [facility] for Physical therapy

## 2019-10-09 ENCOUNTER — Telehealth: Payer: Self-pay | Admitting: Neurology

## 2019-10-09 NOTE — Telephone Encounter (Signed)
Home Health called needing to get Verbal Orders on this patient. Please Call [phone]. Thanks

## 2019-10-09 NOTE — Telephone Encounter (Signed)
No answer [phone]

## 2019-10-10 NOTE — Telephone Encounter (Signed)
Left message for nurse to call back.  

## 2019-10-16 ENCOUNTER — Telehealth: Payer: Self-pay | Admitting: Neurology

## 2019-10-16 NOTE — Telephone Encounter (Signed)
Verbal given to continue physical therapy.

## 2019-10-16 NOTE — Telephone Encounter (Signed)
Gerilynn from [facility] called to let Dr. Posey Pronto know the patient had two falls without injury since she'd last seen the patient. He is scheduled to pick up his walker today. FYI only.

## 2019-10-16 NOTE — Telephone Encounter (Signed)
FYI

## 2019-10-16 NOTE — Telephone Encounter (Signed)
Nurse left msg with after hours needing the verbal order on patient. Can leave msg on VM. Call back at [phone]. Thanks!

## 2020-03-01 DEATH — deceased
